# Patient Record
Sex: Male | Born: 1960 | Race: White | Hispanic: No | Smoking: Never smoker
Health system: Southern US, Community
[De-identification: ages and names within clinical notes are randomized; demographics above are authoritative.]

## PROBLEM LIST (undated history)

## (undated) DIAGNOSIS — U071 COVID-19: Secondary | ICD-10-CM

## (undated) DIAGNOSIS — R011 Cardiac murmur, unspecified: Secondary | ICD-10-CM

## (undated) DIAGNOSIS — I10 Essential (primary) hypertension: Secondary | ICD-10-CM

## (undated) DIAGNOSIS — I739 Peripheral vascular disease, unspecified: Secondary | ICD-10-CM

## (undated) DIAGNOSIS — J189 Pneumonia, unspecified organism: Secondary | ICD-10-CM

## (undated) DIAGNOSIS — E785 Hyperlipidemia, unspecified: Secondary | ICD-10-CM

## (undated) DIAGNOSIS — E119 Type 2 diabetes mellitus without complications: Secondary | ICD-10-CM

## (undated) HISTORY — DX: Type 2 diabetes mellitus without complications: E11.9

## (undated) HISTORY — PX: HAND SURGERY: SHX662

## (undated) HISTORY — DX: Essential (primary) hypertension: I10

## (undated) HISTORY — PX: TONSILLECTOMY AND ADENOIDECTOMY: SUR1326

## (undated) HISTORY — DX: Hyperlipidemia, unspecified: E78.5

---

## 2020-02-22 DIAGNOSIS — E11319 Type 2 diabetes mellitus with unspecified diabetic retinopathy without macular edema: Secondary | ICD-10-CM | POA: Insufficient documentation

## 2020-02-22 DIAGNOSIS — E785 Hyperlipidemia, unspecified: Secondary | ICD-10-CM | POA: Insufficient documentation

## 2020-02-22 DIAGNOSIS — I10 Essential (primary) hypertension: Secondary | ICD-10-CM | POA: Insufficient documentation

## 2020-02-22 DIAGNOSIS — E1169 Type 2 diabetes mellitus with other specified complication: Secondary | ICD-10-CM | POA: Insufficient documentation

## 2020-02-22 DIAGNOSIS — E1142 Type 2 diabetes mellitus with diabetic polyneuropathy: Secondary | ICD-10-CM | POA: Insufficient documentation

## 2020-03-12 LAB — EXTERNAL GENERIC LAB PROCEDURE: COLOGUARD: NEGATIVE

## 2020-05-11 DIAGNOSIS — G4700 Insomnia, unspecified: Secondary | ICD-10-CM | POA: Insufficient documentation

## 2020-05-26 ENCOUNTER — Encounter (INDEPENDENT_AMBULATORY_CARE_PROVIDER_SITE_OTHER): Payer: Self-pay | Admitting: Vascular Surgery

## 2020-05-26 ENCOUNTER — Other Ambulatory Visit (INDEPENDENT_AMBULATORY_CARE_PROVIDER_SITE_OTHER): Payer: Self-pay | Admitting: Vascular Surgery

## 2020-05-26 ENCOUNTER — Ambulatory Visit (INDEPENDENT_AMBULATORY_CARE_PROVIDER_SITE_OTHER): Payer: 59

## 2020-05-26 ENCOUNTER — Ambulatory Visit (INDEPENDENT_AMBULATORY_CARE_PROVIDER_SITE_OTHER): Payer: 59 | Admitting: Vascular Surgery

## 2020-05-26 ENCOUNTER — Other Ambulatory Visit: Payer: Self-pay

## 2020-05-26 ENCOUNTER — Other Ambulatory Visit (INDEPENDENT_AMBULATORY_CARE_PROVIDER_SITE_OTHER): Payer: Self-pay | Admitting: Podiatry

## 2020-05-26 ENCOUNTER — Telehealth (INDEPENDENT_AMBULATORY_CARE_PROVIDER_SITE_OTHER): Payer: Self-pay

## 2020-05-26 VITALS — BP 171/90 | HR 88 | Resp 16 | Ht 66.0 in | Wt 154.4 lb

## 2020-05-26 DIAGNOSIS — I10 Essential (primary) hypertension: Secondary | ICD-10-CM | POA: Diagnosis not present

## 2020-05-26 DIAGNOSIS — E782 Mixed hyperlipidemia: Secondary | ICD-10-CM | POA: Diagnosis not present

## 2020-05-26 DIAGNOSIS — E1142 Type 2 diabetes mellitus with diabetic polyneuropathy: Secondary | ICD-10-CM | POA: Diagnosis not present

## 2020-05-26 DIAGNOSIS — I739 Peripheral vascular disease, unspecified: Secondary | ICD-10-CM | POA: Diagnosis not present

## 2020-05-26 DIAGNOSIS — I7025 Atherosclerosis of native arteries of other extremities with ulceration: Secondary | ICD-10-CM | POA: Diagnosis not present

## 2020-05-26 NOTE — Telephone Encounter (Signed)
Spoke with the patient and he is scheduled with Dr. Gilda Crease for a left leg angio on 06/07/20 with a 1:15 pm arrival time to the [facility]. Covid testing on 06/03/20 between 8-2 pm at the [facility]. Pre-procedure instructions were discussed and will be mailed.

## 2020-05-26 NOTE — Progress Notes (Signed)
MRN : [id number]  Scott Howard is a 60 y.o. (04/26/1960) male who presents with chief complaint of  Chief Complaint  Patient presents with  . New Patient (Initial Visit)    Ref Engineer, production for PVD  .  History of Present Illness:   The patient is seen for evaluation of painful lower extremities and diminished pulses associated with ulceration of both feet but the left is more severely affected.  The patient notes the ulcer has been present for multiple weeks, occurring on March 30, 2020, and some seem not to be improving.  It is not painful.  There is a specific history of trauma with walking on a hot boardwalk in [place].  The patient denies fever or chills.  the patient does have diabetes which has been difficult to control.  Patient notes prior to the ulcer developing the patient denies claudication or rest pain.   The patient denies rest pain or dangling of an extremity off the side of the bed during the night for relief.  No prior interventions or surgeries.  No history of back problems or DJD of the lumbar sacral spine.   The patient denies amaurosis fugax or recent TIA symptoms. There are no recent neurological changes noted. The patient denies history of DVT, PE or superficial thrombophlebitis. The patient denies recent episodes of angina or shortness of breath.   ABI's Rt=1.23 with biphasic signals and Lt=[place] TBI=0.73 with biphasic signals  Current Meds  Medication Sig  . doxycycline (VIBRA-TABS) 100 MG tablet Take 100 mg by mouth 2 (two) times daily.  Marland Kitchen glipiZIDE (GLUCOTROL) 5 MG tablet Take 5 mg by mouth 2 (two) times daily.  Marland Kitchen lisinopril (ZESTRIL) 10 MG tablet Take 1 tablet by mouth daily.  . pravastatin (PRAVACHOL) 20 MG tablet Take 20 mg by mouth at bedtime.  Marland Kitchen SANTYL ointment Apply topically daily.    Past Medical History:  Diagnosis Date  . Diabetes mellitus without complication (HCC)   . Hyperlipidemia    borderline  . Hypertension       Social  History Social History   Tobacco Use  . Smoking status: Never Smoker  . Smokeless tobacco: Never Used  Vaping Use  . Vaping Use: Never used  Substance Use Topics  . Alcohol use: Yes    Comment: ocassionally  . Drug use: Never    Family History Family History  Problem Relation Age of Onset  . Cancer Mother   . Cancer Father   . Hypertension Brother   . Congestive Heart Failure Brother   No family history of bleeding/clotting disorders, porphyria or autoimmune disease   Allergies  Allergen Reactions  . Sulfa Antibiotics Rash     REVIEW OF SYSTEMS (Negative unless checked)  Constitutional: [] Weight loss  [] Fever  [] Chills Cardiac: [] Chest pain   [] Chest pressure   [] Palpitations   [] Shortness of breath when laying flat   [] Shortness of breath with exertion. Vascular:  [] Pain in legs with walking   [] Pain in legs at rest  [] History of DVT   [] Phlebitis   [] Swelling in legs   [] Varicose veins   [x] Non-healing ulcers Pulmonary:   [] Uses home oxygen   [] Productive cough   [] Hemoptysis   [] Wheeze  [] COPD   [] Asthma Neurologic:  [] Dizziness   [] Seizures   [] History of stroke   [] History of TIA  [] Aphasia   [] Vissual changes   [] Weakness or numbness in arm   [] Weakness or numbness in leg Musculoskeletal:   [] Joint swelling   []   Joint pain   [] Low back pain Hematologic:  [] Easy bruising  [] Easy bleeding   [] Hypercoagulable state   [] Anemic Gastrointestinal:  [] Diarrhea   [] Vomiting  [] Gastroesophageal reflux/heartburn   [] Difficulty swallowing. Genitourinary:  [] Chronic kidney disease   [] Difficult urination  [] Frequent urination   [] Blood in urine Skin:  [] Rashes   [] Ulcers  Psychological:  [] History of anxiety   []  History of major depression.  Physical Examination  Vitals:   05/26/20 1122  BP: (!) 171/90  Pulse: 88  Resp: 16  Weight: 154 lb 6.4 oz (70 kg)  Height: 5\' 6"  (1.676 m)   Body mass index is 24.92 kg/m. Gen: WD/WN, NAD Head: [place]/AT, No temporalis wasting.   Ear/Nose/Throat: Hearing grossly intact, nares w/o erythema or drainage, poor dentition Eyes: PER, EOMI, sclera nonicteric.  Neck: Supple, no masses.  No bruit or JVD.  Pulmonary:  Good air movement, clear to auscultation bilaterally, no use of accessory muscles.  Cardiac: RRR, normal S1, S2, no Murmurs. Vascular: both feet with multiple ulcers Vessel Right Left  Radial Palpable Palpable  PT Not Palpable Not Palpable  DP Not Palpable Not Palpable  Gastrointestinal: soft, non-distended. No guarding/no peritoneal signs.  Musculoskeletal: M/S 5/5 throughout.  No deformity or atrophy.  Neurologic: CN 2-12 intact. Pain and light touch intact in extremities.  Symmetrical.  Speech is fluent. Motor exam as listed above. Psychiatric: Judgment intact, Mood & affect appropriate for pt's clinical situation. Dermatologic: No rashes + ulcers noted.  No changes consistent with cellulitis.  CBC No results found for: WBC, HGB, HCT, MCV, PLT  BMET No results found for: NA, K, CL, CO2, GLUCOSE, BUN, CREATININE, CALCIUM, GFRNONAA, GFRAA CrCl cannot be calculated (No successful lab value found.).  COAG No results found for: INR, PROTIME  Radiology No results found.   Assessment/Plan 1. Atherosclerosis of native arteries of the extremities with ulceration (HCC)  Recommend:  The patient has evidence of severe atherosclerotic changes of both lower extremities associated with ulceration and tissue loss of the left > right foot.  This represents a limb threatening ischemia and places the patient at the risk for left limb loss.  ABI's Rt=1.23 with biphasic signals and Lt=[place] TBI=0.73 with biphasic signals  Patient should undergo angiography of the left lower extremity with the hope for intervention for limb salvage.  The risks and benefits as well as the alternative therapies was discussed in detail with the patient.  All questions were answered.  Patient agrees to proceed with left leg  angiography.  The patient will follow up with me in the office after the procedure.    2. Diabetic polyneuropathy associated with type 2 diabetes mellitus (HCC) Continue hypoglycemic medications as already ordered, these medications have been reviewed and there are no changes at this time.  Hgb A1C to be monitored as already arranged by primary service   3. HTN (hypertension), benign Continue antihypertensive medications as already ordered, these medications have been reviewed and there are no changes at this time.   4. Mixed hyperlipidemia Continue statin as ordered and reviewed, no changes at this time     , MD  05/26/2020 1:23 PM

## 2020-05-26 NOTE — H&P (View-Only) (Signed)
  MRN : [id number]  Scott Howard is a 60 y.o. (02/13/1960) male who presents with chief complaint of  Chief Complaint  Patient presents with  . New Patient (Initial Visit)    Ref Baker for PVD  .  History of Present Illness:   The patient is seen for evaluation of painful lower extremities and diminished pulses associated with ulceration of both feet but the left is more severely affected.  The patient notes the ulcer has been present for multiple weeks, occurring on March 30, 2020, and some seem not to be improving.  It is not painful.  There is a specific history of trauma with walking on a hot boardwalk in [place].  The patient denies fever or chills.  the patient does have diabetes which has been difficult to control.  Patient notes prior to the ulcer developing the patient denies claudication or rest pain.   The patient denies rest pain or dangling of an extremity off the side of the bed during the night for relief.  No prior interventions or surgeries.  No history of back problems or DJD of the lumbar sacral spine.   The patient denies amaurosis fugax or recent TIA symptoms. There are no recent neurological changes noted. The patient denies history of DVT, PE or superficial thrombophlebitis. The patient denies recent episodes of angina or shortness of breath.   ABI's Rt=1.23 with biphasic signals and Lt=[place] TBI=0.73 with biphasic signals  Current Meds  Medication Sig  . doxycycline (VIBRA-TABS) 100 MG tablet Take 100 mg by mouth 2 (two) times daily.  . glipiZIDE (GLUCOTROL) 5 MG tablet Take 5 mg by mouth 2 (two) times daily.  . lisinopril (ZESTRIL) 10 MG tablet Take 1 tablet by mouth daily.  . pravastatin (PRAVACHOL) 20 MG tablet Take 20 mg by mouth at bedtime.  . SANTYL ointment Apply topically daily.    Past Medical History:  Diagnosis Date  . Diabetes mellitus without complication (HCC)   . Hyperlipidemia    borderline  . Hypertension       Social  History Social History   Tobacco Use  . Smoking status: Never Smoker  . Smokeless tobacco: Never Used  Vaping Use  . Vaping Use: Never used  Substance Use Topics  . Alcohol use: Yes    Comment: ocassionally  . Drug use: Never    Family History Family History  Problem Relation Age of Onset  . Cancer Mother   . Cancer Father   . Hypertension Brother   . Congestive Heart Failure Brother   No family history of bleeding/clotting disorders, porphyria or autoimmune disease   Allergies  Allergen Reactions  . Sulfa Antibiotics Rash     REVIEW OF SYSTEMS (Negative unless checked)  Constitutional: []Weight loss  []Fever  []Chills Cardiac: []Chest pain   []Chest pressure   []Palpitations   []Shortness of breath when laying flat   []Shortness of breath with exertion. Vascular:  []Pain in legs with walking   []Pain in legs at rest  []History of DVT   []Phlebitis   []Swelling in legs   []Varicose veins   [x]Non-healing ulcers Pulmonary:   []Uses home oxygen   []Productive cough   []Hemoptysis   []Wheeze  []COPD   []Asthma Neurologic:  []Dizziness   []Seizures   []History of stroke   []History of TIA  []Aphasia   []Vissual changes   []Weakness or numbness in arm   []Weakness or numbness in leg Musculoskeletal:   []Joint swelling   []  Joint pain   [] Low back pain Hematologic:  [] Easy bruising  [] Easy bleeding   [] Hypercoagulable state   [] Anemic Gastrointestinal:  [] Diarrhea   [] Vomiting  [] Gastroesophageal reflux/heartburn   [] Difficulty swallowing. Genitourinary:  [] Chronic kidney disease   [] Difficult urination  [] Frequent urination   [] Blood in urine Skin:  [] Rashes   [] Ulcers  Psychological:  [] History of anxiety   []  History of major depression.  Physical Examination  Vitals:   05/26/20 1122  BP: (!) 171/90  Pulse: 88  Resp: 16  Weight: 154 lb 6.4 oz (70 kg)  Height: 5\' 6"  (1.676 m)   Body mass index is 24.92 kg/m. Gen: WD/WN, NAD Head: [place]/AT, No temporalis wasting.   Ear/Nose/Throat: Hearing grossly intact, nares w/o erythema or drainage, poor dentition Eyes: PER, EOMI, sclera nonicteric.  Neck: Supple, no masses.  No bruit or JVD.  Pulmonary:  Good air movement, clear to auscultation bilaterally, no use of accessory muscles.  Cardiac: RRR, normal S1, S2, no Murmurs. Vascular: both feet with multiple ulcers Vessel Right Left  Radial Palpable Palpable  PT Not Palpable Not Palpable  DP Not Palpable Not Palpable  Gastrointestinal: soft, non-distended. No guarding/no peritoneal signs.  Musculoskeletal: M/S 5/5 throughout.  No deformity or atrophy.  Neurologic: CN 2-12 intact. Pain and light touch intact in extremities.  Symmetrical.  Speech is fluent. Motor exam as listed above. Psychiatric: Judgment intact, Mood & affect appropriate for pt's clinical situation. Dermatologic: No rashes + ulcers noted.  No changes consistent with cellulitis.  CBC No results found for: WBC, HGB, HCT, MCV, PLT  BMET No results found for: NA, K, CL, CO2, GLUCOSE, BUN, CREATININE, CALCIUM, GFRNONAA, GFRAA CrCl cannot be calculated (No successful lab value found.).  COAG No results found for: INR, PROTIME  Radiology No results found.   Assessment/Plan 1. Atherosclerosis of native arteries of the extremities with ulceration (HCC)  Recommend:  The patient has evidence of severe atherosclerotic changes of both lower extremities associated with ulceration and tissue loss of the left > right foot.  This represents a limb threatening ischemia and places the patient at the risk for left limb loss.  ABI's Rt=1.23 with biphasic signals and Lt=[place] TBI=0.73 with biphasic signals  Patient should undergo angiography of the left lower extremity with the hope for intervention for limb salvage.  The risks and benefits as well as the alternative therapies was discussed in detail with the patient.  All questions were answered.  Patient agrees to proceed with left leg  angiography.  The patient will follow up with me in the office after the procedure.    2. Diabetic polyneuropathy associated with type 2 diabetes mellitus (HCC) Continue hypoglycemic medications as already ordered, these medications have been reviewed and there are no changes at this time.  Hgb A1C to be monitored as already arranged by primary service   3. HTN (hypertension), benign Continue antihypertensive medications as already ordered, these medications have been reviewed and there are no changes at this time.   4. Mixed hyperlipidemia Continue statin as ordered and reviewed, no changes at this time     , MD  05/26/2020 1:23 PM

## 2020-05-27 ENCOUNTER — Other Ambulatory Visit: Payer: Self-pay

## 2020-05-27 ENCOUNTER — Inpatient Hospital Stay
Admission: EM | Admit: 2020-05-27 | Discharge: 2020-06-01 | DRG: 617 | Disposition: A | Discharge status: Home / Self Care | Payer: 59 | Attending: Internal Medicine | Admitting: Internal Medicine

## 2020-05-27 ENCOUNTER — Other Ambulatory Visit (INDEPENDENT_AMBULATORY_CARE_PROVIDER_SITE_OTHER): Payer: Self-pay | Admitting: Vascular Surgery

## 2020-05-27 ENCOUNTER — Encounter
Admission: EM | Disposition: A | Discharge status: Home / Self Care | Payer: Self-pay | Source: Home / Self Care | Attending: Internal Medicine

## 2020-05-27 DIAGNOSIS — L97509 Non-pressure chronic ulcer of other part of unspecified foot with unspecified severity: Secondary | ICD-10-CM

## 2020-05-27 DIAGNOSIS — L97529 Non-pressure chronic ulcer of other part of left foot with unspecified severity: Secondary | ICD-10-CM | POA: Diagnosis present

## 2020-05-27 DIAGNOSIS — E1152 Type 2 diabetes mellitus with diabetic peripheral angiopathy with gangrene: Secondary | ICD-10-CM | POA: Diagnosis present

## 2020-05-27 DIAGNOSIS — L97519 Non-pressure chronic ulcer of other part of right foot with unspecified severity: Secondary | ICD-10-CM | POA: Diagnosis present

## 2020-05-27 DIAGNOSIS — I739 Peripheral vascular disease, unspecified: Secondary | ICD-10-CM

## 2020-05-27 DIAGNOSIS — E785 Hyperlipidemia, unspecified: Secondary | ICD-10-CM | POA: Diagnosis present

## 2020-05-27 DIAGNOSIS — I10 Essential (primary) hypertension: Secondary | ICD-10-CM | POA: Diagnosis present

## 2020-05-27 DIAGNOSIS — Z8249 Family history of ischemic heart disease and other diseases of the circulatory system: Secondary | ICD-10-CM

## 2020-05-27 DIAGNOSIS — Z79899 Other long term (current) drug therapy: Secondary | ICD-10-CM

## 2020-05-27 DIAGNOSIS — Z20822 Contact with and (suspected) exposure to covid-19: Secondary | ICD-10-CM | POA: Diagnosis present

## 2020-05-27 DIAGNOSIS — E860 Dehydration: Secondary | ICD-10-CM | POA: Diagnosis present

## 2020-05-27 DIAGNOSIS — I70249 Atherosclerosis of native arteries of left leg with ulceration of unspecified site: Secondary | ICD-10-CM | POA: Diagnosis not present

## 2020-05-27 DIAGNOSIS — Z809 Family history of malignant neoplasm, unspecified: Secondary | ICD-10-CM | POA: Diagnosis not present

## 2020-05-27 DIAGNOSIS — E1142 Type 2 diabetes mellitus with diabetic polyneuropathy: Secondary | ICD-10-CM | POA: Diagnosis present

## 2020-05-27 DIAGNOSIS — R7989 Other specified abnormal findings of blood chemistry: Secondary | ICD-10-CM | POA: Diagnosis present

## 2020-05-27 DIAGNOSIS — N19 Unspecified kidney failure: Secondary | ICD-10-CM | POA: Diagnosis present

## 2020-05-27 DIAGNOSIS — L97412 Non-pressure chronic ulcer of right heel and midfoot with fat layer exposed: Secondary | ICD-10-CM | POA: Diagnosis not present

## 2020-05-27 DIAGNOSIS — M869 Osteomyelitis, unspecified: Secondary | ICD-10-CM | POA: Diagnosis not present

## 2020-05-27 DIAGNOSIS — M86172 Other acute osteomyelitis, left ankle and foot: Secondary | ICD-10-CM | POA: Diagnosis present

## 2020-05-27 DIAGNOSIS — R809 Proteinuria, unspecified: Secondary | ICD-10-CM | POA: Diagnosis present

## 2020-05-27 DIAGNOSIS — E782 Mixed hyperlipidemia: Secondary | ICD-10-CM | POA: Diagnosis present

## 2020-05-27 DIAGNOSIS — E1169 Type 2 diabetes mellitus with other specified complication: Principal | ICD-10-CM | POA: Diagnosis present

## 2020-05-27 DIAGNOSIS — Y9301 Activity, walking, marching and hiking: Secondary | ICD-10-CM | POA: Diagnosis present

## 2020-05-27 DIAGNOSIS — E10621 Type 1 diabetes mellitus with foot ulcer: Secondary | ICD-10-CM

## 2020-05-27 DIAGNOSIS — Z7984 Long term (current) use of oral hypoglycemic drugs: Secondary | ICD-10-CM | POA: Diagnosis not present

## 2020-05-27 DIAGNOSIS — I70239 Atherosclerosis of native arteries of right leg with ulceration of unspecified site: Secondary | ICD-10-CM | POA: Diagnosis not present

## 2020-05-27 DIAGNOSIS — X58XXXA Exposure to other specified factors, initial encounter: Secondary | ICD-10-CM | POA: Diagnosis present

## 2020-05-27 DIAGNOSIS — Z882 Allergy status to sulfonamides status: Secondary | ICD-10-CM

## 2020-05-27 DIAGNOSIS — E11628 Type 2 diabetes mellitus with other skin complications: Secondary | ICD-10-CM | POA: Diagnosis present

## 2020-05-27 DIAGNOSIS — E11621 Type 2 diabetes mellitus with foot ulcer: Secondary | ICD-10-CM | POA: Diagnosis present

## 2020-05-27 DIAGNOSIS — L97511 Non-pressure chronic ulcer of other part of right foot limited to breakdown of skin: Secondary | ICD-10-CM | POA: Diagnosis not present

## 2020-05-27 DIAGNOSIS — I7025 Atherosclerosis of native arteries of other extremities with ulceration: Secondary | ICD-10-CM | POA: Diagnosis present

## 2020-05-27 DIAGNOSIS — I70235 Atherosclerosis of native arteries of right leg with ulceration of other part of foot: Secondary | ICD-10-CM | POA: Diagnosis not present

## 2020-05-27 DIAGNOSIS — L089 Local infection of the skin and subcutaneous tissue, unspecified: Secondary | ICD-10-CM | POA: Diagnosis present

## 2020-05-27 HISTORY — PX: LOWER EXTREMITY ANGIOGRAPHY: CATH118251

## 2020-05-27 LAB — COMPREHENSIVE METABOLIC PANEL
ALT: 10 U/L (ref 0–44)
AST: 10 U/L — ABNORMAL LOW (ref 15–41)
Albumin: 3.4 g/dL — ABNORMAL LOW (ref 3.5–5.0)
Alkaline Phosphatase: 72 U/L (ref 38–126)
Anion gap: 9 (ref 5–15)
BUN: 22 mg/dL — ABNORMAL HIGH (ref 6–20)
CO2: 24 mmol/L (ref 22–32)
Calcium: 9.2 mg/dL (ref 8.9–10.3)
Chloride: 100 mmol/L (ref 98–111)
Creatinine, Ser: 1.06 mg/dL (ref 0.61–1.24)
GFR, Estimated: >60 mL/min (ref >60)
Glucose, Bld: 139 mg/dL — ABNORMAL HIGH (ref 70–99)
Potassium: 4.5 mmol/L (ref 3.5–5.1)
Sodium: 133 mmol/L — ABNORMAL LOW (ref 135–145)
Total Bilirubin: 1.4 mg/dL — ABNORMAL HIGH (ref 0.3–1.2)
Total Protein: 8 g/dL (ref 6.5–8.1)

## 2020-05-27 LAB — CBC WITH DIFFERENTIAL/PLATELET
Abs Immature Granulocytes: 0.04 10*3/uL (ref 0.00–0.07)
Basophils Absolute: 0 10*3/uL (ref 0.0–0.1)
Basophils Relative: 1 %
Eosinophils Absolute: 0.1 10*3/uL (ref 0.0–0.5)
Eosinophils Relative: 2 %
HCT: 36.9 % — ABNORMAL LOW (ref 39.0–52.0)
Hemoglobin: 12.7 g/dL — ABNORMAL LOW (ref 13.0–17.0)
Immature Granulocytes: 1 %
Lymphocytes Relative: 22 %
Lymphs Abs: 1.6 10*3/uL (ref 0.7–4.0)
MCH: 29 pg (ref 26.0–34.0)
MCHC: 34.4 g/dL (ref 30.0–36.0)
MCV: 84.2 fL (ref 80.0–100.0)
Monocytes Absolute: 0.6 10*3/uL (ref 0.1–1.0)
Monocytes Relative: 8 %
Neutro Abs: 5.1 10*3/uL (ref 1.7–7.7)
Neutrophils Relative %: 66 %
Platelets: 268 10*3/uL (ref 150–400)
RBC: 4.38 MIL/uL (ref 4.22–5.81)
RDW: 11.6 % (ref 11.5–15.5)
WBC: 7.4 10*3/uL (ref 4.0–10.5)
nRBC: 0 % (ref 0.0–0.2)

## 2020-05-27 LAB — RESP PANEL BY RT-PCR (FLU A&B, COVID) ARPGX2
Influenza A by PCR: NEGATIVE
Influenza B by PCR: NEGATIVE
SARS Coronavirus 2 by RT PCR: NEGATIVE

## 2020-05-27 LAB — GLUCOSE, CAPILLARY: Glucose-Capillary: 99 mg/dL (ref 70–99)

## 2020-05-27 LAB — PROTIME-INR
INR: 1 (ref 0.8–1.2)
Prothrombin Time: 13.5 seconds (ref 11.4–15.2)

## 2020-05-27 LAB — C-REACTIVE PROTEIN: CRP: 3.3 mg/dL — ABNORMAL HIGH (ref <1.0)

## 2020-05-27 LAB — MAGNESIUM: Magnesium: 1.6 mg/dL — ABNORMAL LOW (ref 1.7–2.4)

## 2020-05-27 LAB — SEDIMENTATION RATE: Sed Rate: 83 mm/hr — ABNORMAL HIGH (ref 0–20)

## 2020-05-27 SURGERY — LOWER EXTREMITY ANGIOGRAPHY
Anesthesia: Moderate Sedation | Laterality: Left

## 2020-05-27 MED ORDER — SODIUM CHLORIDE 0.9 % IV SOLN: 250.0000 mL | INTRAVENOUS | Status: DC | PRN

## 2020-05-27 MED ORDER — MIDAZOLAM HCL 2 MG/ML PO SYRP: 8.0000 mg | ORAL_SOLUTION | Freq: Once | ORAL | Status: DC | PRN

## 2020-05-27 MED ORDER — VANCOMYCIN HCL 1750 MG/350ML IV SOLN
1750.0000 mg | Freq: Once | INTRAVENOUS | Status: AC
  Administered 2020-05-27: 1750 mg via INTRAVENOUS
  Filled 2020-05-27: qty 350

## 2020-05-27 MED ORDER — HEPARIN SODIUM (PORCINE) 1000 UNIT/ML IJ SOLN
INTRAMUSCULAR | Status: AC
  Filled 2020-05-27: qty 1

## 2020-05-27 MED ORDER — ONDANSETRON HCL 4 MG/2ML IJ SOLN: 4.0000 mg | Freq: Four times a day (QID) | INTRAMUSCULAR | Status: DC | PRN

## 2020-05-27 MED ORDER — HEPARIN (PORCINE) 25000 UT/250ML-% IV SOLN
1200.0000 [IU]/h | INTRAVENOUS | Status: DC
  Administered 2020-05-27: 950 [IU]/h via INTRAVENOUS
  Filled 2020-05-27: qty 250

## 2020-05-27 MED ORDER — DIPHENHYDRAMINE HCL 50 MG/ML IJ SOLN: 50.0000 mg | Freq: Once | INTRAMUSCULAR | Status: DC | PRN

## 2020-05-27 MED ORDER — SODIUM CHLORIDE 0.9 % IV BOLUS
500.0000 mL | Freq: Once | INTRAVENOUS | Status: AC
  Administered 2020-05-27: 500 mL via INTRAVENOUS

## 2020-05-27 MED ORDER — FENTANYL CITRATE (PF) 100 MCG/2ML IJ SOLN
INTRAMUSCULAR | Status: AC
  Filled 2020-05-27: qty 2

## 2020-05-27 MED ORDER — ACETAMINOPHEN 325 MG PO TABS
650.0000 mg | ORAL_TABLET | Freq: Four times a day (QID) | ORAL | Status: DC | PRN
  Administered 2020-05-27 – 2020-06-01 (×6): 650 mg via ORAL
  Filled 2020-05-27 (×5): qty 2

## 2020-05-27 MED ORDER — FENTANYL CITRATE (PF) 100 MCG/2ML IJ SOLN
INTRAMUSCULAR | Status: DC | PRN
  Administered 2020-05-27 (×2): 25 ug via INTRAVENOUS
  Administered 2020-05-27: 50 ug via INTRAVENOUS
  Administered 2020-05-27: 25 ug via INTRAVENOUS

## 2020-05-27 MED ORDER — MIDAZOLAM HCL 5 MG/5ML IJ SOLN
INTRAMUSCULAR | Status: AC
  Filled 2020-05-27: qty 5

## 2020-05-27 MED ORDER — SODIUM CHLORIDE 0.9 % IV SOLN: INTRAVENOUS | Status: DC

## 2020-05-27 MED ORDER — ACETAMINOPHEN 650 MG RE SUPP: 650.0000 mg | Freq: Four times a day (QID) | RECTAL | Status: DC | PRN

## 2020-05-27 MED ORDER — HYDRALAZINE HCL 20 MG/ML IJ SOLN
5.0000 mg | INTRAMUSCULAR | Status: DC | PRN
  Administered 2020-05-30: 5 mg via INTRAVENOUS
  Filled 2020-05-27: qty 1

## 2020-05-27 MED ORDER — SODIUM CHLORIDE 0.9 % IV SOLN: INTRAVENOUS | Status: AC

## 2020-05-27 MED ORDER — CEFAZOLIN SODIUM-DEXTROSE 2-4 GM/100ML-% IV SOLN
2.0000 g | Freq: Once | INTRAVENOUS | Status: AC
  Administered 2020-05-27: 2 g via INTRAVENOUS

## 2020-05-27 MED ORDER — VANCOMYCIN HCL 1750 MG/350ML IV SOLN
1750.0000 mg | Freq: Once | INTRAVENOUS | Status: DC
  Filled 2020-05-27: qty 350

## 2020-05-27 MED ORDER — HYDROMORPHONE HCL 1 MG/ML IJ SOLN: 1.0000 mg | Freq: Once | INTRAMUSCULAR | Status: DC | PRN

## 2020-05-27 MED ORDER — HEPARIN SODIUM (PORCINE) 1000 UNIT/ML IJ SOLN
INTRAMUSCULAR | Status: DC | PRN
  Administered 2020-05-27: 4000 [IU] via INTRAVENOUS
  Administered 2020-05-27: 1000 [IU] via INTRAVENOUS

## 2020-05-27 MED ORDER — OXYCODONE HCL 5 MG PO TABS: 5.0000 mg | ORAL_TABLET | ORAL | Status: DC | PRN

## 2020-05-27 MED ORDER — LABETALOL HCL 5 MG/ML IV SOLN: 10.0000 mg | INTRAVENOUS | Status: DC | PRN

## 2020-05-27 MED ORDER — IODIXANOL 320 MG/ML IV SOLN
INTRAVENOUS | Status: DC | PRN
  Administered 2020-05-27: 85 mL

## 2020-05-27 MED ORDER — MORPHINE SULFATE (PF) 2 MG/ML IV SOLN: 2.0000 mg | INTRAVENOUS | Status: DC | PRN

## 2020-05-27 MED ORDER — SODIUM CHLORIDE 0.9% FLUSH
3.0000 mL | Freq: Two times a day (BID) | INTRAVENOUS | Status: DC
  Administered 2020-05-28 – 2020-05-31 (×5): 3 mL via INTRAVENOUS

## 2020-05-27 MED ORDER — INSULIN ASPART 100 UNIT/ML IJ SOLN: 0.0000 [IU] | Freq: Four times a day (QID) | INTRAMUSCULAR | Status: DC

## 2020-05-27 MED ORDER — VANCOMYCIN HCL 1500 MG/300ML IV SOLN
1500.0000 mg | INTRAVENOUS | Status: DC
  Administered 2020-05-28 – 2020-05-29 (×2): 1500 mg via INTRAVENOUS
  Filled 2020-05-27 (×3): qty 300

## 2020-05-27 MED ORDER — METHYLPREDNISOLONE SODIUM SUCC 125 MG IJ SOLR: 125.0000 mg | Freq: Once | INTRAMUSCULAR | Status: DC | PRN

## 2020-05-27 MED ORDER — SODIUM CHLORIDE 0.9% FLUSH: 3.0000 mL | INTRAVENOUS | Status: DC | PRN

## 2020-05-27 MED ORDER — MIDAZOLAM HCL 2 MG/2ML IJ SOLN
INTRAMUSCULAR | Status: DC | PRN
  Administered 2020-05-27: 1 mg via INTRAVENOUS
  Administered 2020-05-27: 2 mg via INTRAVENOUS
  Administered 2020-05-27 (×2): 1 mg via INTRAVENOUS

## 2020-05-27 MED ORDER — FAMOTIDINE 20 MG PO TABS: 40.0000 mg | ORAL_TABLET | Freq: Once | ORAL | Status: DC | PRN

## 2020-05-27 MED ORDER — ACETAMINOPHEN 325 MG PO TABS
650.0000 mg | ORAL_TABLET | ORAL | Status: DC | PRN
  Filled 2020-05-27: qty 2

## 2020-05-27 MED ORDER — FENTANYL CITRATE (PF) 100 MCG/2ML IJ SOLN
25.0000 ug | INTRAMUSCULAR | Status: DC | PRN
  Administered 2020-05-29: 100 ug via INTRAVENOUS

## 2020-05-27 MED ORDER — SODIUM CHLORIDE 0.9 % IV SOLN
2.0000 g | INTRAVENOUS | Status: DC
  Administered 2020-05-28 – 2020-05-30 (×4): 2 g via INTRAVENOUS
  Filled 2020-05-27 (×2): qty 2
  Filled 2020-05-27: qty 20

## 2020-05-27 SURGICAL SUPPLY — 27 items
BALLN ULTRASCORE 014 3X100X150 (BALLOONS) ×2
BALLN ULTRASCORE 014 3X40X150 (BALLOONS) ×2
BALLN ULTRVRSE 2.5X40X150 (BALLOONS) ×2
BALLN ULTRVRSE 2X40X150 (BALLOONS) ×2
BALLOON ULTRSCRE 014 3X100X150 (BALLOONS) ×1 IMPLANT
BALLOON ULTRSCRE 014 3X40X150 (BALLOONS) ×1 IMPLANT
BALLOON ULTRVRSE 2.5X40X150 (BALLOONS) ×1 IMPLANT
BALLOON ULTRVRSE 2X40X150 (BALLOONS) ×1 IMPLANT
CANISTER PENUMBRA ENGINE (MISCELLANEOUS) ×2 IMPLANT
CANNULA 5F STIFF (CANNULA) ×2 IMPLANT
CATH ANGIO 5F PIGTAIL 65CM (CATHETERS) ×2 IMPLANT
CATH INDIGO CAT RX KIT (CATHETERS) ×2 IMPLANT
CATH INDIGO CAT3 KIT (CATHETERS) ×2 IMPLANT
CATH VERT 5FR 125CM (CATHETERS) ×2 IMPLANT
COVER PROBE U/S 5X48 (MISCELLANEOUS) ×2 IMPLANT
DEVICE STARCLOSE SE CLOSURE (Vascular Products) ×2 IMPLANT
DEVICE TORQUE (MISCELLANEOUS) ×2 IMPLANT
GLIDEWIRE ADV .014X300CM (WIRE) ×2 IMPLANT
GLIDEWIRE ADV .035X260CM (WIRE) ×2 IMPLANT
KIT ENCORE 26 ADVANTAGE (KITS) ×2 IMPLANT
PACK ANGIOGRAPHY (CUSTOM PROCEDURE TRAY) ×2 IMPLANT
SHEATH BRITE TIP 5FRX11 (SHEATH) ×2 IMPLANT
SHEATH RAABE 6FRX70 (SHEATH) ×2 IMPLANT
SYR MEDRAD MARK 7 150ML (SYRINGE) ×2 IMPLANT
TUBING CONTRAST HIGH PRESS 48 (TUBING) ×4 IMPLANT
WIRE GUIDERIGHT .035X150 (WIRE) ×2 IMPLANT
WIRE RUNTHROUGH .014X300CM (WIRE) ×2 IMPLANT

## 2020-05-27 NOTE — ED Provider Notes (Signed)
[facility] Emergency Department Provider Note  ____________________________________________   Event Date/Time   First MD Initiated Contact with Patient 05/27/20 1302     (approximate)  I have reviewed the triage vital signs and the nursing notes.   HISTORY  Chief Complaint Wound Infection    HPI Scott Howard is a 60 y.o. male with diabetes, hypertension, hyperlipidemia who comes in with osteomyelitis of his left pinky toe.  Patient reports having osteomyelitis diagnosed on x-ray by podiatry.  He is currently on doxycycline started on Wednesday.  Patient was sent in for vascular to do angiography and consideration of amputation of the toe.  Patient does report that the wound has been constant, few days, nothing makes better, nothing makes it worse.  Denies any coldness of his foot or fevers or any other concerns       Past Medical History:  Diagnosis Date  . Diabetes mellitus without complication (HCC)   . Hyperlipidemia    borderline  . Hypertension     Patient Active Problem List   Diagnosis Date Noted  . Atherosclerosis of native arteries of the extremities with ulceration (HCC) 05/26/2020  . Insomnia 05/11/2020  . Diabetic polyneuropathy associated with type 2 diabetes mellitus (HCC) 02/22/2020  . HTN (hypertension), benign 02/22/2020  . Hyperlipidemia 02/22/2020  . Type 2 diabetes mellitus with retinopathy, with long-term current use of insulin (HCC) 02/22/2020    Past Surgical History:  Procedure Laterality Date  . HAND SURGERY Left   . TONSILLECTOMY AND ADENOIDECTOMY      Prior to Admission medications   Medication Sig Start Date End Date Taking? Authorizing Provider  doxycycline (VIBRA-TABS) 100 MG tablet Take 100 mg by mouth 2 (two) times daily. 05/25/20   [provider]  glipiZIDE (GLUCOTROL) 5 MG tablet Take 5 mg by mouth 2 (two) times daily. 02/23/20   [provider]  lisinopril (ZESTRIL) 10 MG tablet Take  1 tablet by mouth daily. 03/23/20   [provider]  pravastatin (PRAVACHOL) 20 MG tablet Take 20 mg by mouth at bedtime. 03/23/20   [provider]  SANTYL ointment Apply topically daily. 05/25/20   [provider]    Allergies Sulfa antibiotics  Family History  Problem Relation Age of Onset  . Cancer Mother   . Cancer Father   . Hypertension Brother   . Congestive Heart Failure Brother     Social History Social History   Tobacco Use  . Smoking status: Never Smoker  . Smokeless tobacco: Never Used  Vaping Use  . Vaping Use: Never used  Substance Use Topics  . Alcohol use: Yes    Comment: ocassionally  . Drug use: Never      Review of Systems Constitutional: No fever/chills Eyes: No visual changes. ENT: No sore throat. Cardiovascular: Denies chest pain. Respiratory: Denies shortness of breath. Gastrointestinal: No abdominal pain.  No nausea, no vomiting.  No diarrhea.  No constipation. Genitourinary: Negative for dysuria. Musculoskeletal: Negative for back pain.  Toe infection Skin: Negative for rash. Neurological: Negative for headaches, focal weakness or numbness. All other ROS negative ____________________________________________   PHYSICAL EXAM:  VITAL SIGNS: ED Triage Vitals [05/27/20 1154]  Enc Vitals Group     BP 131/84     Pulse Rate 91     Resp 16     Temp 98.5 F (36.9 C)     Temp Source Oral     SpO2 97 %     Weight 154 lb  5.2 oz (70 kg)     Height 5\' 6"  (1.676 m)     Head Circumference      Peak Flow      Pain Score 0     Pain Loc      Pain Edu?      Excl. in GC?     Constitutional: Alert and oriented. Well appearing and in no acute distress. Eyes: Conjunctivae are normal. EOMI. Head: Atraumatic. Nose: No congestion/rhinnorhea. Mouth/Throat: Mucous membranes are moist.   Neck: No stridor. Trachea Midline. FROM Cardiovascular: Normal rate, regular rhythm. Grossly normal heart sounds.  Good peripheral  circulation. Respiratory: Normal respiratory effort.  No retractions. Lungs CTAB. Gastrointestinal: Soft and nontender. No distention. No abdominal bruits.  Musculoskeletal: Foot appears warm and well-perfused but does have a necrotic pinky toe. Neurologic:  Normal speech and language. No gross focal neurologic deficits are appreciated.  Skin:  Skin is warm, dry and intact. No rash noted. Psychiatric: Mood and affect are normal. Speech and behavior are normal. GU: Deferred   ____________________________________________   LABS (all labs ordered are listed, but only abnormal results are displayed)  Labs Reviewed  COMPREHENSIVE METABOLIC PANEL - Abnormal; Notable for the following components:      Result Value   Sodium 133 (*)    Glucose, Bld 139 (*)    BUN 22 (*)    Albumin 3.4 (*)    AST 10 (*)    Total Bilirubin 1.4 (*)    All other components within normal limits  CBC WITH DIFFERENTIAL/PLATELET - Abnormal; Notable for the following components:   Hemoglobin 12.7 (*)    HCT 36.9 (*)    All other components within normal limits  URINALYSIS, COMPLETE (UACMP) WITH MICROSCOPIC   ____________________________________________     RADIOLOGY , personally viewed and evaluated these images (plain radiographs) as part of my medical decision making, as well as reviewing the written report by the radiologist.  ED MD interpretation: Pending MRI  Official radiology report(s): No results found.  ____________________________________________   PROCEDURES  Procedure(s) performed (including Critical Care):  Procedures   ____________________________________________   INITIAL IMPRESSION / ASSESSMENT AND PLAN / ED COURSE  Scott Howard was evaluated in Emergency Department on 05/27/2020 for the symptoms described in the history of present illness. He was evaluated in the context of the global COVID-19 pandemic, which necessitated consideration that the patient might be  at risk for infection with the SARS-CoV-2 virus that causes COVID-19. Institutional protocols and algorithms that pertain to the evaluation of patients at risk for COVID-19 are in a state of rapid change based on information released by regulatory bodies including the CDC and federal and state organizations. These policies and algorithms were followed during the patient's care in the ED.     Patient is a 60 year old who comes in with osteomyelitis.  Patient is not septic appearing.  Low suspicion for bacteremia.  Currently patient is foot feels warm and I have low suspicion for acute ischemia although clearly he is got some ischemic component given the necrotic toe.  Labs were ordered to evaluate for white count, electrolyte abnormalities, AKI.  I discussed with podiatry who recommended MRI.  At this time we will keep patient on the doxycycline given he just started that.  And discussed with Dr. [id number] from vascular who states that they will try to do angiography today.  Discussed the hospital team and they will admit patient      ____________________________________________  FINAL CLINICAL IMPRESSION(S) / ED DIAGNOSES   Final diagnoses:  Other acute osteomyelitis of left foot (HCC)      MEDICATIONS GIVEN DURING THIS VISIT:  Medications  acetaminophen (TYLENOL) tablet 650 mg ( Oral MAR Hold 05/27/20 1525)    Or  acetaminophen (TYLENOL) suppository 650 mg ( Rectal MAR Hold 05/27/20 1525)  0.9 %  sodium chloride infusion (has no administration in time range)  cefTRIAXone (ROCEPHIN) 2 g in sodium chloride 0.9 % 100 mL IVPB ( Intravenous Automatically Held 06/04/20 1500)  fentaNYL (SUBLIMAZE) injection 25 mcg ( Intravenous MAR Hold 05/27/20 1525)  ondansetron (ZOFRAN) injection 4 mg ( Intravenous MAR Hold 05/27/20 1525)  insulin aspart (novoLOG) injection 0-6 Units ( Subcutaneous Automatically Held 06/04/20 1800)  vancomycin (VANCOREADY) IVPB 1500 mg/300 mL ( Intravenous Automatically Held  06/05/20 1800)  vancomycin (VANCOREADY) IVPB 1750 mg/350 mL (has no administration in time range)  heparin sodium (porcine) 1000 UNIT/ML injection (has no administration in time range)  fentaNYL (SUBLIMAZE) 100 MCG/2ML injection (has no administration in time range)  midazolam (VERSED) 5 MG/5ML injection (has no administration in time range)     ED Discharge Orders    None       Note:  This document was prepared using Dragon voice recognition software and may include unintentional dictation errors.   Concha Se, MD 05/27/20 [id number]

## 2020-05-27 NOTE — Interval H&P Note (Signed)
History and Physical Interval Note:  05/27/2020 3:48 PM  Scott Howard  has presented today for surgery, with the diagnosis of Atherosclerotic disease with claudication.  The various methods of treatment have been discussed with the patient and family. After consideration of risks, benefits and other options for treatment, the patient has consented to  Procedure(s): Lower Extremity Angiography (Left) as a surgical intervention.  The patient's history has been reviewed, patient examined, no change in status, stable for surgery.  I have reviewed the patient's chart and labs.  Questions were answered to the patient's satisfaction.     [name]

## 2020-05-27 NOTE — H&P (Signed)
History and Physical    PLEASE NOTE THAT DRAGON DICTATION SOFTWARE WAS USED IN THE CONSTRUCTION OF THIS NOTE.   Scott Howard RKY:[id number] DOB: [birth year] DOA: 05/27/2020  PCP: [name], MD Patient coming from: home   I have personally briefly reviewed patient's old medical records in [place]  Chief Complaint: Osteomyelitis of left fifth toe  HPI: Scott Howard is a 60 y.o. male with medical history significant for type 2 diabetes mellitus complicated by peripheral polyneuropathy and foot ulcers, hypertension, hyperlipidemia, who is admitted to [facility] on 05/27/2020 with osteomyelitis of the left fifth toe after presenting from home to [facility] ED for further evaluation and management of such at the direction of his outpatient podiatrist.   The patient reports that he developed bilateral foot ulcer approximately 7 weeks ago after walking on a hot boardwalk in [place] in his bare feet.  Subsequently, he has been following with outpatient podiatry and wound care, and conveys outstanding compliance with the wound care instructions that he has been provided.  However, in spite of this, he reports progression of ulceration on the left foot, for which she followed up with Dr. Luana Shu of podiatry on Wednesday, 05/25/2020.  At the time of that appointment, the patient underwent plain films of the left foot which were suggestive of osteomyelitis of the left fifth toe prompting initiation of oral doxycycline.  Patient conveys good interval compliance with his antibiotic, with first dose occurring on 05/25/2020, but notes no consistent improvement in the ulceration associated with his left foot over that time.  Dr. Luana Shu referred the patient to vascular surgery for further evaluation, including angiography of the left lower extremity.  Presently, the patient was seen by Dr. Delana Meyer In vascular surgery clinic on 05/26/2020.  At that time, ABI of the right lower extremity was  found to be 1.23, while TBI of the left was noted to be 0.73.  Per review of consult note from that appointment, vascular surgery recommended that the patient undergo left lower extremity angiography.  However, the patient reports that the angiography could not be scheduled until May 10, prompting Dr. Luana Shu to advise the patient to present to the emergency department for further evaluation of his recently diagnosed osteomyelitis of the left fifth toe.   Today, the patient denies any recent discharge from his foot ulcer, including purulence.  Denies any acute cold sensation associated with the left lower extremity or left foot.  Denies any subjective fever, chills, rigors, or generalized myalgias.  Denies any known history of underlying coronary artery disease, and denies any recent chest pain, diaphoresis, palpitations, dizziness, presyncope, or syncope.  He denies any known history of underlying heart failure, denies any recent shortness of breath, orthopnea, or new onset peripheral edema.  No recent cough, wheezing, nausea, vomiting, abdominal pain, diarrhea.  No recent known COVID-19 exposures.  He also denies any recent dysuria, gross hematuria, or change in urinary urgency/frequency.  Per patient, he is not on any blood thinning agents as an outpatient, including no aspirin.  He reports that he has been n.p.o. since yesterday in anticipation of angiography.  Medical history notable for type 2 diabetes mellitus with associated peripheral polyneuropathy.  Most recent hemoglobin A1c was noted to be 12.3% on 02/22/2020.  Patient reports that he is not on any insulin as an outpatient, and takes glipizide as a sole oral hypoglycemic agent.     ED Course:  Vital signs in the ED were notable for  the following: Tetramex 98.5, heart rate 80-91; blood pressure 131/84 -146/77; respiratory rate 16, oxygen saturation 97 to 99% on room air.  Labs were notable for the following: CMP was notable for the following:  Sodium 133, potassium 4.5, BUN 22, creatinine 1.06, glucose 139.  CBC was notable for white blood cell count 7400, hemoglobin 12.7, platelets 268.  Screening nasopharyngeal COVID-19 PCR was performed in the ED today, and found to be negative.  Urinalysis was ordered in the ED, with result currently pending.  The patient's case was discussed with the on-call vascular surgeon, Dr. Delana Meyer, who will formally consult. Dr. Delana Meyer recommends angiography of the left lower extremity, which will try to be completed today.  Additionally, case was discussed with Dr. Cleda Mccreedy of podiatry, who will also formally consult.  Dr. Cleda Mccreedy recommended MRI of the left foot, and plans to evaluate the patient later today, for determination of timing of surgery involving left fifth toe, which may happen tomorrow.  The patient is to be kept n.p.o. for now for angiography of the left lower extremity, with subsequent n.p.o. after midnight for potential amputation of the left fifth toe tomorrow.  While in the ED, the following were administered:  Continuous normal saline x75 cc/h.     Review of Systems: As per HPI otherwise 10 point review of systems negative.   Past Medical History:  Diagnosis Date  . Diabetes mellitus without complication ([place])   . Hyperlipidemia    borderline  . Hypertension     Past Surgical History:  Procedure Laterality Date  . HAND SURGERY Left   . TONSILLECTOMY AND ADENOIDECTOMY      Social History:  reports that he has never smoked. He has never used smokeless tobacco. He reports current alcohol use. He reports that he does not use drugs.   Allergies  Allergen Reactions  . Sulfa Antibiotics Rash    Family History  Problem Relation Age of Onset  . Cancer Mother   . Cancer Father   . Hypertension Brother   . Congestive Heart Failure Brother       Prior to Admission medications   Medication Sig Start Date End Date Taking? Authorizing Provider  doxycycline (VIBRA-TABS) 100 MG  tablet Take 100 mg by mouth 2 (two) times daily. 05/25/20  Yes [provider]  glipiZIDE (GLUCOTROL) 5 MG tablet Take 5 mg by mouth 2 (two) times daily. 02/23/20  Yes [provider]  ibuprofen (ADVIL) 800 MG tablet Take 800 mg by mouth every 8 (eight) hours as needed.   Yes [provider]  lisinopril (ZESTRIL) 10 MG tablet Take 1 tablet by mouth daily. 03/23/20  Yes [provider]  pravastatin (PRAVACHOL) 20 MG tablet Take 20 mg by mouth at bedtime. 03/23/20  Yes [provider]  SANTYL ointment Apply topically daily. 05/25/20  Yes [provider]     Objective    Physical Exam: Vitals:   05/27/20 1154 05/27/20 1315  BP: 131/84 (!) 146/77  Pulse: 91 80  Resp: 16 16  Temp: 98.5 F (36.9 C)   TempSrc: Oral   SpO2: 97% 99%  Weight: 70 kg   Height: '5\' 6"'  (1.676 m)     General: appears to be stated age; alert, oriented Skin: warm, dry; left lateral foot dressing c/d/i Head:  AT/[place] Mouth:  Oral mucosa membranes appear dry, normal dentition Neck: supple; trachea midline Heart:  RRR; did not appreciate any M/R/G Lungs: CTAB, did not appreciate any wheezes, rales, or rhonchi Abdomen: +  BS; soft, ND, NT Extremities: no peripheral edema, no muscle wasting, eft lateral foot dressing c/d/i, as above.  Neuro: strength intact in upper and lower extremities b/l; sensation intact in bilateral upper extremities.    Labs on Admission: I have personally reviewed following labs and imaging studies  CBC: Recent Labs  Lab 05/27/20 1157  WBC 7.4  NEUTROABS 5.1  HGB 12.7*  HCT 36.9*  MCV 84.2  PLT 132   Basic Metabolic Panel: Recent Labs  Lab 05/27/20 1157  NA 133*  K 4.5  CL 100  CO2 24  GLUCOSE 139*  BUN 22*  CREATININE 1.06  CALCIUM 9.2   GFR: Estimated Creatinine Clearance: 66.9 mL/min (by C-G formula based on SCr of 1.06 mg/dL). Liver Function Tests: Recent Labs  Lab 05/27/20 1157  AST 10*  ALT 10  ALKPHOS 72   BILITOT 1.4*  PROT 8.0  ALBUMIN 3.4*   No results for input(s): LIPASE, AMYLASE in the last 168 hours. No results for input(s): AMMONIA in the last 168 hours. Coagulation Profile: No results for input(s): INR, PROTIME in the last 168 hours. Cardiac Enzymes: No results for input(s): CKTOTAL, CKMB, CKMBINDEX, TROPONINI in the last 168 hours. BNP (last 3 results) No results for input(s): PROBNP in the last 8760 hours. HbA1C: No results for input(s): HGBA1C in the last 72 hours. CBG: No results for input(s): GLUCAP in the last 168 hours. Lipid Profile: No results for input(s): CHOL, HDL, LDLCALC, TRIG, CHOLHDL, LDLDIRECT in the last 72 hours. Thyroid Function Tests: No results for input(s): TSH, T4TOTAL, FREET4, T3FREE, THYROIDAB in the last 72 hours. Anemia Panel: No results for input(s): VITAMINB12, FOLATE, FERRITIN, TIBC, IRON, RETICCTPCT in the last 72 hours. Urine analysis: No results found for: COLORURINE, APPEARANCEUR, LABSPEC, PHURINE, GLUCOSEU, HGBUR, BILIRUBINUR, KETONESUR, PROTEINUR, UROBILINOGEN, NITRITE, LEUKOCYTESUR  Radiological Exams on Admission: VAS [place] ABI WITH/WO TBI  Result Date: 05/26/2020  LOWER EXTREMITY DOPPLER STUDY Patient Name:  Scott Howard  Date of Exam:   05/26/2020 Medical Rec #: [id number]       Accession #:    [id number] Date of Birth: August 21, 1960       Patient Gender: M Patient Age:   60Y Exam Location:  [facility] Procedure:      VAS [place] ABI WITH/WO TBI Referring Phys: [id number] [place] --------------------------------------------------------------------------------  Indications: Peripheral artery disease.  Performing Technologist: Almira Coaster RVS  Examination Guidelines: A complete evaluation includes at minimum, Doppler waveform signals and systolic blood pressure reading at the level of bilateral brachial, anterior tibial, and posterior tibial arteries, when vessel segments are accessible. Bilateral testing is considered an  integral part of a complete examination. Photoelectric Plethysmograph (PPG) waveforms and toe systolic pressure readings are included as required and additional duplex testing as needed. Limited examinations for reoccurring indications may be performed as noted.  ABI Findings: +---------+------------------+-----+--------+--------+ Right    Rt Pressure (mmHg)IndexWaveformComment  +---------+------------------+-----+--------+--------+ Brachial 149                                     +---------+------------------+-----+--------+--------+ ATA      139               0.89 biphasic         +---------+------------------+-----+--------+--------+ PTA      193               1.23 biphasic         +---------+------------------+-----+--------+--------+  Great Toe250               1.59 Normal  [place]       +---------+------------------+-----+--------+--------+ +---------+------------------+-----+--------+-------+ Left     Lt Pressure (mmHg)IndexWaveformComment +---------+------------------+-----+--------+-------+ Brachial 157                                    +---------+------------------+-----+--------+-------+ ATA      250               1.59 biphasic        +---------+------------------+-----+--------+-------+ PTA      250               1.59 biphasic        +---------+------------------+-----+--------+-------+ Great Toe115               0.73 Normal          +---------+------------------+-----+--------+-------+ +-------+-----------+-----------+------------+------------+ ABI/TBIToday's ABIToday's TBIPrevious ABIPrevious TBI +-------+-----------+-----------+------------+------------+ Right  1.23       >1.0 [place]                             +-------+-----------+-----------+------------+------------+ Left   >1.0 [place]    .73                                 +-------+-----------+-----------+------------+------------+  Summary: Right: Resting right ankle-brachial index  is within normal range. No evidence of significant right lower extremity arterial disease. The right toe-brachial index is normal. Left: Resting left ankle-brachial index indicates noncompressible left lower extremity arteries. The left toe-brachial index is normal.  *See table(s) above for measurements and observations.  Electronically signed by Hortencia Pilar MD on 05/26/2020 at 5:50:25 PM.    Final      Assessment/Plan   MEHDI GIRONDA is a 60 y.o. male with medical history significant for type 2 diabetes mellitus complicated by peripheral polyneuropathy and foot ulcers, hypertension, hyperlipidemia, who is admitted to [facility] on 05/27/2020 with osteomyelitis of the left fifth toe after presenting from home to [facility] ED for further evaluation and management of such at the direction of his outpatient podiatrist.    Principal Problem:   Osteomyelitis of fifth toe of left foot ([place]) Active Problems:   Diabetic polyneuropathy associated with type 2 diabetes mellitus ([place])   HTN (hypertension), benign   Hyperlipidemia   Dehydration   Acute prerenal azotemia     #) Osteomyelitis of the left fifth toe: Diagnosis of such established on 05/25/2020 with plain films of the left foot performed as an outpatient the setting of poorly healing diabetic foot ulcer of the left foot. Dr. Delana Meyer has been consulted, with plan for angiography of the left lower extremity today.  He requests the patient be kept n.p.o. leading up to this.  Additionally, Dr. Cleda Mccreedy of podiatry has been consulted, and request MRI of the left foot, with plan to evaluate the patient in person today along with tentative plan for amputation of the fifth left toe possibly tomorrow morning (05/28/20).  Complicating factors leading to development of the patient's diabetic foot ulcer as well as delayed healing of such and ensuing development of osteomyelitis include underlying diabetes associated with peripheral  polyneuropathy as well as peripheral artery disease.  Of note, SIRS criteria are not met at this time, and therefore the patient is not  septic at present.  Will initiate empiric IV antibiotics for coverage of osteomyelitis, as further described below, after collection of blood cultures x2.   No evidence of acute MI or acutely decompensated heart failure at this time.  Consequently, no overt contraindication to proceeding with proposed surgery at this time.  The patient is not on any blood thinning agents at home, including no aspirin.    Plan: Vascular surgery consultation, with plan for angiography of the left lower extremity later today, as above.  Maintain n.p.o. status leading up to angiography.  Additionally, podiatry consulted, as above, with plan to evaluate the patient later today.  Tentatively, we will keep patient n.p.o. after midnight for potential surgical intervention tomorrow (05/28/20).  Preop EKG.  Add on INR.  Blood cultures x2 prior to initiation of IV vancomycin and Rocephin.  Repeat CBC in the morning.  Check ESR and CRP.     #) Dehydration: Clinical appearance of dehydration in the setting of dry oral mucous membranes, with supportive laboratory findings that include prerenal azotemia.  Suspect contribution from the patient remaining n.p.o. throughout today in anticipation of angiography of the left lower extremity, as further described above.  Plan: Continuous IV fluids with normal saline at 75 cc/h.  Monitor strict I's and O's and daily weights.  Repeat BMP in the morning.       #) Essential hypertension: Outpatient hypertensive regimen is limited to lisinopril.  Presenting systolic blood pressures noted to be in the 130s to 140s, without any evidence of associated hypotension.  Plan: In the setting of current n.p.o. status, will hold home lisinopril for now.  Close monitoring of ensuing blood pressure via routine vital signs.       #) Hyperlipidemia: On pravastatin  as an outpatient.  Plan: Currently holding home statin in the setting of n.p.o. status, with plan to resume postoperatively.       #) Type 2 diabetes mellitus: Complicated by peripheral polyneuropathy.  Appears suboptimally controlled as an outpatient, with most recent hemoglobin A1c noted to be 12.3% on 02/22/2020.  Not on any insulin as an outpatient, rather on glipizide as a sole outpatient oral hypoglycemic agent.  Presenting blood sugar per presenting BMP noted to be 139.  Suboptimal glycemic control appears to be a complicating factor in the rate of healing of the patient's diabetic foot ulcers, and ultimate development of apparent osteomyelitis of the left fifth toe.  Plan: Hold home glipizide during this hospitalization.  Check hemoglobin A1c.  In the context of current n.p.o. status, have ordered Accu-Cheks every 6 hours with low-dose sliding scale insulin.     DVT prophylaxis: scd's  Code Status: Full code Family Communication: none Disposition Plan: Per Rounding Team Consults called: Dr. Cleda Mccreedy of podiatry, and Dr. Delana Meyer of vascular surgery have both been consulted, as further detailed above. Admission status: Inpatient; MedSurg.     Of note, this patient was added by me to the following Admit List/Treatment Team: armcadmits.      PLEASE NOTE THAT DRAGON DICTATION SOFTWARE WAS USED IN THE CONSTRUCTION OF THIS NOTE.   [facility] Pager [phone] From 12PM - 12AM  Otherwise, please contact night-coverage  [email or web address] Password [facility]   05/27/2020, 2:39 PM

## 2020-05-27 NOTE — Consult Note (Addendum)
ANTICOAGULATION CONSULT NOTE   Pharmacy Consult for Heparin Indication: Multiple tibial angioplasties left lower extremity  Allergies  Allergen Reactions  . Sulfa Antibiotics Rash    Patient Measurements: Height: 5\' 6"  (167.6 cm) Weight: 69.9 kg (154 lb) IBW/kg (Calculated) : 63.8 Heparin Dosing Weight: 69.9 kg  Vital Signs: Temp: 98.4 F (36.9 C) (04/29 1506) Temp Source: Oral (04/29 1506) BP: 153/86 (04/29 1745) Pulse Rate: 71 (04/29 1745)  Labs: Recent Labs    05/27/20 1157  HGB 12.7*  HCT 36.9*  PLT 268  CREATININE 1.06    Estimated Creatinine Clearance: 66.9 mL/min (by C-G formula based on SCr of 1.06 mg/dL).   Medical History: Past Medical History:  Diagnosis Date  . Diabetes mellitus without complication (HCC)   . Hyperlipidemia    borderline  . Hypertension     Medications:  Medications Prior to Admission  Medication Sig Dispense Refill Last Dose  . doxycycline (VIBRA-TABS) 100 MG tablet Take 100 mg by mouth 2 (two) times daily.   05/26/2020 at 2000  . glipiZIDE (GLUCOTROL) 5 MG tablet Take 5 mg by mouth 2 (two) times daily.   05/26/2020 at 2000  . ibuprofen (ADVIL) 800 MG tablet Take 800 mg by mouth every 8 (eight) hours as needed.   Past Week at Unknown time  . lisinopril (ZESTRIL) 10 MG tablet Take 1 tablet by mouth daily.   05/26/2020 at 1200  . pravastatin (PRAVACHOL) 20 MG tablet Take 20 mg by mouth at bedtime.   05/26/2020 at 2000  . SANTYL ointment Apply topically daily.   Past Week at Unknown time   Scheduled:  . fentaNYL      . fentaNYL      . heparin sodium (porcine)      . [MAR Hold] insulin aspart  0-6 Units Subcutaneous Q6H  . midazolam       Infusions:  . sodium chloride    . sodium chloride    . [MAR Hold] cefTRIAXone (ROCEPHIN)  IV    . [MAR Hold] vancomycin    . vancomycin     PRN: [MAR Hold] acetaminophen **OR** [MAR Hold] acetaminophen, diphenhydrAMINE, famotidine, [MAR Hold] fentaNYL (SUBLIMAZE) injection, HYDROmorphone  (DILAUDID) injection, methylPREDNISolone (SOLU-MEDROL) injection, midazolam, [MAR Hold] ondansetron (ZOFRAN) IV, ondansetron (ZOFRAN) IV Anti-infectives (From admission, onward)   Start     Dose/Rate Route Frequency Ordered Stop   05/28/20 1800  [MAR Hold]  vancomycin (VANCOREADY) IVPB 1500 mg/300 mL        (MAR Hold since Fri 05/27/2020 at 1525.Hold Reason: Transfer to a Procedural area.)   1,500 mg 150 mL/hr over 120 Minutes Intravenous Every 24 hours 05/27/20 1456     05/28/20 0000  ceFAZolin (ANCEF) IVPB 2g/100 mL premix       Note to Pharmacy: To be given in specials   2 g 200 mL/hr over 30 Minutes Intravenous  Once 05/27/20 1556 05/27/20 1710   05/27/20 1800  vancomycin (VANCOREADY) IVPB 1750 mg/350 mL        1,750 mg 175 mL/hr over 120 Minutes Intravenous  Once 05/27/20 1539     05/27/20 1600  vancomycin (VANCOREADY) IVPB 1750 mg/350 mL  Status:  Discontinued        1,750 mg 175 mL/hr over 120 Minutes Intravenous  Once 05/27/20 1456 05/27/20 1539   05/27/20 1500  [MAR Hold]  cefTRIAXone (ROCEPHIN) 2 g in sodium chloride 0.9 % 100 mL IVPB        (MAR Hold since Fri 05/27/2020 at 1525.Hold Reason:  Transfer to a Procedural area.)   2 g 200 mL/hr over 30 Minutes Intravenous Every 24 hours 05/27/20 1442        Assessment: Pharmacy consulted to start heparin for multiple tibial angioplasties left lower extremity. No bolus for the start, but can follow nomogram afterwards. No DOAC PTA.   Goal of Therapy:  Heparin level 0.3-0.7 units/ml Monitor platelets by anticoagulation protocol: Yes   Plan:  Start heparin infusion at 950 units/hr Check anti-Xa level in 6 hours and daily while on heparin Continue to monitor H&H and platelets  Ronnald Ramp, PharmD, BCPS 05/27/2020,5:59 PM

## 2020-05-27 NOTE — ED Notes (Signed)
See triage note, pt states here for bone infection to left foot pinky toe. Denies pain. Post op shoe to bilateral feet

## 2020-05-27 NOTE — Consult Note (Signed)
Reason for Consult: Gangrene left foot with osteomyelitis and multiple ulcerations bilateral. Referring Physician: Audelia ActonHowerter  Scott Howard is an 60 y.o. male.  HPI: This is a 60 year old male with significant history of diabetes with neuropathy who sustained an injury to both of his feet a couple of months ago while in [place].  He has been managed outpatient for management of the ulcerations.  Recent x-rays showed evidence for osteomyelitis in at least the fifth toe but possibly the fourth.  Was recommended that the patient present to the emergency department for admission where he could also be expedited as far as having his circulation checked out.  Patient presented earlier today and was able to have angiogram performed today.  Past Medical History:  Diagnosis Date  . Diabetes mellitus without complication (HCC)   . Hyperlipidemia    borderline  . Hypertension     Past Surgical History:  Procedure Laterality Date  . HAND SURGERY Left   . TONSILLECTOMY AND ADENOIDECTOMY      Family History  Problem Relation Age of Onset  . Cancer Mother   . Cancer Father   . Hypertension Brother   . Congestive Heart Failure Brother     Social History:  reports that he has never smoked. He has never used smokeless tobacco. He reports current alcohol use. He reports that he does not use drugs.  Allergies:  Allergies  Allergen Reactions  . Sulfa Antibiotics Rash  . Silvadene [Silver Sulfadiazine]     Medications:  Scheduled: . fentaNYL      . fentaNYL      . heparin sodium (porcine)      . insulin aspart  0-6 Units Subcutaneous Q6H  . midazolam      . sodium chloride flush  3 mL Intravenous Q12H    Results for orders placed or performed during the hospital encounter of 05/27/20 (from the past 48 hour(s))  Comprehensive metabolic panel     Status: Abnormal   Collection Time: 05/27/20 11:57 AM  Result Value Ref Range   Sodium 133 (L) 135 - 145 mmol/L    Potassium 4.5 3.5 - 5.1 mmol/L   Chloride 100 98 - 111 mmol/L   CO2 24 22 - 32 mmol/L   Glucose, Bld 139 (H) 70 - 99 mg/dL    Comment: Glucose reference range applies only to samples taken after fasting for at least 8 hours.   BUN 22 (H) 6 - 20 mg/dL   Creatinine, Ser 0.981.06 0.61 - 1.24 mg/dL   Calcium 9.2 8.9 - 11.910.3 mg/dL   Total Protein 8.0 6.5 - 8.1 g/dL   Albumin 3.4 (L) 3.5 - 5.0 g/dL   AST 10 (L) 15 - 41 U/L   ALT 10 0 - 44 U/L   Alkaline Phosphatase 72 38 - 126 U/L   Total Bilirubin 1.4 (H) 0.3 - 1.2 mg/dL   GFR, Estimated >14>60 >78>60 mL/min    Comment: (NOTE) Calculated using the CKD-EPI Creatinine Equation (2021)    Anion gap 9 5 - 15    Comment: Performed at [facility], [address] [place], [place] [address]  CBC with Differential     Status: Abnormal   Collection Time: 05/27/20 11:57 AM  Result Value Ref Range   WBC 7.4 4.0 - 10.5 K/uL   RBC 4.38 4.22 - 5.81 MIL/uL   Hemoglobin 12.7 (L) 13.0 - 17.0 g/dL   HCT 13.036.9 (L) 86.539.0 - 78.452.0 %  MCV 84.2 80.0 - 100.0 fL   MCH 29.0 26.0 - 34.0 pg   MCHC 34.4 30.0 - 36.0 g/dL   RDW 64.4 03.4 - 74.2 %   Platelets 268 150 - 400 K/uL   nRBC 0.0 0.0 - 0.2 %   Neutrophils Relative % 66 %   Neutro Abs 5.1 1.7 - 7.7 K/uL   Lymphocytes Relative 22 %   Lymphs Abs 1.6 0.7 - 4.0 K/uL   Monocytes Relative 8 %   Monocytes Absolute 0.6 0.1 - 1.0 K/uL   Eosinophils Relative 2 %   Eosinophils Absolute 0.1 0.0 - 0.5 K/uL   Basophils Relative 1 %   Basophils Absolute 0.0 0.0 - 0.1 K/uL   Immature Granulocytes 1 %   Abs Immature Granulocytes 0.04 0.00 - 0.07 K/uL    Comment: Performed at [facility], [address] [place], [place] [address]  Sedimentation rate     Status: Abnormal   Collection Time: 05/27/20 11:57 AM  Result Value Ref Range   Sed Rate 83 (H) 0 - 20 mm/hr    Comment: Performed at [facility], [address], [place], [place] [address]  Magnesium     Status: Abnormal   Collection Time:  05/27/20 11:57 AM  Result Value Ref Range   Magnesium 1.6 (L) 1.7 - 2.4 mg/dL    Comment: Performed at [facility], [address], [place], [place] [address]  Resp Panel by RT-PCR (Flu A&B, Covid) Nasopharyngeal Swab     Status: None   Collection Time: 05/27/20  1:44 PM   Specimen: Nasopharyngeal Swab; Nasopharyngeal(NP) swabs in vial transport medium  Result Value Ref Range   SARS Coronavirus 2 by RT PCR NEGATIVE NEGATIVE    Comment: (NOTE) SARS-CoV-2 target nucleic acids are NOT DETECTED.  The SARS-CoV-2 RNA is generally detectable in upper respiratory specimens during the acute phase of infection. The lowest concentration of SARS-CoV-2 viral copies this assay can detect is 138 copies/mL. A negative result does not preclude SARS-Cov-2 infection and should not be used as the sole basis for treatment or other patient management decisions. A negative result may occur with  improper specimen collection/handling, submission of specimen other than nasopharyngeal swab, presence of viral mutation(s) within the areas targeted by this assay, and inadequate number of viral copies(<138 copies/mL). A negative result must be combined with clinical observations, patient history, and epidemiological information. The expected result is Negative.  Fact Sheet for Patients:  BloggerCourse.com  Fact Sheet for Healthcare Providers:  SeriousBroker.it  This test is no t yet approved or cleared by the [place] FDA and  has been authorized for detection and/or diagnosis of SARS-CoV-2 by FDA under an Emergency Use Authorization (EUA). This EUA will remain  in effect (meaning this test can be used) for the duration of the COVID-19 declaration under Section 564(b)(1) of the Act, 21 U.S.C.section 360bbb-3(b)(1), unless the authorization is terminated  or revoked sooner.       Influenza A by PCR NEGATIVE NEGATIVE   Influenza B by PCR  NEGATIVE NEGATIVE    Comment: (NOTE) The Xpert Xpress SARS-CoV-2/FLU/RSV plus assay is intended as an aid in the diagnosis of influenza from Nasopharyngeal swab specimens and should not be used as a sole basis for treatment. Nasal washings and aspirates are unacceptable for Xpert Xpress SARS-CoV-2/FLU/RSV testing.  Fact Sheet for Patients: BloggerCourse.com  Fact Sheet for Healthcare Providers: SeriousBroker.it  This test is not yet approved or cleared by the [place] FDA  and has been authorized for detection and/or diagnosis of SARS-CoV-2 by FDA under an Emergency Use Authorization (EUA). This EUA will remain in effect (meaning this test can be used) for the duration of the COVID-19 declaration under Section 564(b)(1) of the Act, 21 U.S.C. section 360bbb-3(b)(1), unless the authorization is terminated or revoked.  Performed at [facility], [address] [place], [place] [address]   Glucose, capillary     Status: None   Collection Time: 05/27/20  5:27 PM  Result Value Ref Range   Glucose-Capillary 99 70 - 99 mg/dL    Comment: Glucose reference range applies only to samples taken after fasting for at least 8 hours.    VAS [place] ABI WITH/WO TBI  Result Date: 05/26/2020  LOWER EXTREMITY DOPPLER STUDY Patient Name:  CAPONE SCHWINN  Date of Exam:   05/26/2020 Medical Rec #: [id number]       Accession #:    [id number] Date of Birth: 1960-08-04       Patient Gender: M Patient Age:   60Y Exam Location:  [facility] Procedure:      VAS [place] ABI WITH/WO TBI Referring Phys: [id number] Latina Craver SCHNIER --------------------------------------------------------------------------------  Indications: Peripheral artery disease.  Performing Technologist: Debbe Bales RVS  Examination Guidelines: A complete evaluation includes at minimum, Doppler waveform signals and systolic blood pressure reading at the level of bilateral  brachial, anterior tibial, and posterior tibial arteries, when vessel segments are accessible. Bilateral testing is considered an integral part of a complete examination. Photoelectric Plethysmograph (PPG) waveforms and toe systolic pressure readings are included as required and additional duplex testing as needed. Limited examinations for reoccurring indications may be performed as noted.  ABI Findings: +---------+------------------+-----+--------+--------+ Right    Rt Pressure (mmHg)IndexWaveformComment  +---------+------------------+-----+--------+--------+ Brachial 149                                     +---------+------------------+-----+--------+--------+ ATA      139               0.89 biphasic         +---------+------------------+-----+--------+--------+ PTA      193               1.23 biphasic         +---------+------------------+-----+--------+--------+ Great Toe250               1.59 Normal  [place]       +---------+------------------+-----+--------+--------+ +---------+------------------+-----+--------+-------+ Left     Lt Pressure (mmHg)IndexWaveformComment +---------+------------------+-----+--------+-------+ Brachial 157                                    +---------+------------------+-----+--------+-------+ ATA      250               1.59 biphasic        +---------+------------------+-----+--------+-------+ PTA      250               1.59 biphasic        +---------+------------------+-----+--------+-------+ Great Toe115               0.73 Normal          +---------+------------------+-----+--------+-------+ +-------+-----------+-----------+------------+------------+ ABI/TBIToday's ABIToday's TBIPrevious ABIPrevious TBI +-------+-----------+-----------+------------+------------+ Right  1.23       >1.0 [place]                             +-------+-----------+-----------+------------+------------+  Left   >1.0 [place]    .73                                  +-------+-----------+-----------+------------+------------+  Summary: Right: Resting right ankle-brachial index is within normal range. No evidence of significant right lower extremity arterial disease. The right toe-brachial index is normal. Left: Resting left ankle-brachial index indicates noncompressible left lower extremity arteries. The left toe-brachial index is normal.  *See table(s) above for measurements and observations.  Electronically signed by [name] MD on 05/26/2020 at 5:50:25 PM.    Final     Review of Systems  Constitutional: Negative for chills and fever.  HENT: Negative for congestion and sore throat.   Respiratory: Negative for cough and shortness of breath.   Cardiovascular: Negative for chest pain and palpitations.  Gastrointestinal: Negative for nausea and vomiting.  Endocrine: Negative for polydipsia and polyuria.  Genitourinary: Negative for frequency and urgency.  Musculoskeletal: Negative for arthralgias and myalgias.  Skin:       Patient relates chronic ulcerations on both of his feet that been going on for about the last 2 months.  Has had some skin grafting to the plantar ulcerations and applying Santyl to the other ulcers.  Neurological:       Significant neuropathy associated with his diabetes.  Psychiatric/Behavioral: Negative for behavioral problems. The patient is not nervous/anxious.    Blood pressure (!) 163/93, pulse 74, temperature 98.3 F (36.8 C), temperature source Oral, resp. rate 16, height 5\' 6"  (1.676 m), weight 69.9 kg, SpO2 99 %. Physical Exam Cardiovascular:     Comments: DP and PT pulses on the left are trace, barely palpable.  DP and PT pulses on the right are trace to +1. Musculoskeletal:     Comments: Adequate range of motion of the pedal joints.  Muscle testing deferred.  Skin:    Comments: The skin is warm dry and atrophic with absent hair growth.  Numerous unstageable ulcerations with eschar noted over the first  and fifth metatarsal head areas bilateral as well as the dorsal foot.  Gangrenous changes are noted to the left fifth toe.  Plantar ulcerations are noted bilateral.  Some erythema is noted surrounding most of the ulcerative sites.  The plantar ulcerations appear fairly clean.  Neurological:     Comments: Loss of sensation distally in the feet and toes.               Assessment/Plan: Assessment: 1.  Gangrene left fifth toe with osteomyelitis. 2.  Multiple unstageable ulcerations bilateral. 3.  Diabetes with associated neuropathy. 4.  Peripheral vascular disease.  Plan: Betadine and dressings applied to both feet.  Discussed with the patient that we will be waiting for results from the MRI to be performed hopefully later this evening.  Discussed with the patient that he will at least need amputation of the fifth toe with probable partial ray resection but could also include the fourth toe pending MRI results.  Discussed with the patient that with his circulation status and diabetes that these wounds may take some time to heal and no guarantees can be given.  I will follow tomorrow and discussed with the patient after his MRI.  05/27/2020, 6:52 PM

## 2020-05-27 NOTE — ED Triage Notes (Signed)
Pt to ER via POV. Pt advised to come to ER by Dr Excell Seltzer after x-ray shows patient has osteo in L pinky toe. Pt referred for angiogram and possible amputation.

## 2020-05-27 NOTE — Plan of Care (Signed)

## 2020-05-27 NOTE — ED Notes (Signed)
Informed RN bed assigned 

## 2020-05-27 NOTE — Consult Note (Signed)
Pharmacy Antibiotic Note  Scott Howard is a 60 y.o. male admitted on 05/27/2020 with Osteomyelitis.  Pharmacy has been consulted for vancomycin dosing.  Plan: Will give vancomycin 1750 mg loading dosing x 1 followed by vancomycin 1500 mg q24H for a predicted AUC of 497. Goal AUC 400-550. Used Scr 1.06. Plan to order vancomycin in the next 4-5 days if continued. Continue to monitor renal function.   Ceftriaxone 2 g daily.   Height: 5\' 6"  (167.6 cm) Weight: 70 kg (154 lb 5.2 oz) IBW/kg (Calculated) : 63.8  Temp (24hrs), Avg:98.5 F (36.9 C), Min:98.5 F (36.9 C), Max:98.5 F (36.9 C)  Recent Labs  Lab 05/27/20 1157  WBC 7.4  CREATININE 1.06    Estimated Creatinine Clearance: 66.9 mL/min (by C-G formula based on SCr of 1.06 mg/dL).    Allergies  Allergen Reactions  . Sulfa Antibiotics Rash    Antimicrobials this admission: 4/29  ceftriaxone >>  4/29 vancomycin >>   Dose adjustments this admission: None  Microbiology results: None  Thank you for allowing pharmacy to be a part of this patient's care.  5/29, PharmD, BCPS 05/27/2020 2:49 PM

## 2020-05-27 NOTE — Interval H&P Note (Signed)
History and Physical Interval Note:  05/27/2020 2:00 PM  Scott Howard  has presented today for surgery, with the diagnosis of Atherosclerotic disease with claudication.  The various methods of treatment have been discussed with the patient and family. After consideration of risks, benefits and other options for treatment, the patient has consented to  Procedure(s): Lower Extremity Angiography (Left) as a surgical intervention.  The patient's history has been reviewed, patient examined, no change in status, stable for surgery.  I have reviewed the patient's chart and labs.  Questions were answered to the patient's satisfaction.     [name]

## 2020-05-28 ENCOUNTER — Inpatient Hospital Stay: Payer: 59

## 2020-05-28 DIAGNOSIS — M86172 Other acute osteomyelitis, left ankle and foot: Secondary | ICD-10-CM | POA: Diagnosis not present

## 2020-05-28 DIAGNOSIS — I739 Peripheral vascular disease, unspecified: Secondary | ICD-10-CM

## 2020-05-28 DIAGNOSIS — E11621 Type 2 diabetes mellitus with foot ulcer: Secondary | ICD-10-CM

## 2020-05-28 DIAGNOSIS — L97519 Non-pressure chronic ulcer of other part of right foot with unspecified severity: Secondary | ICD-10-CM

## 2020-05-28 DIAGNOSIS — E1169 Type 2 diabetes mellitus with other specified complication: Secondary | ICD-10-CM | POA: Diagnosis not present

## 2020-05-28 DIAGNOSIS — E10621 Type 1 diabetes mellitus with foot ulcer: Secondary | ICD-10-CM

## 2020-05-28 DIAGNOSIS — I10 Essential (primary) hypertension: Secondary | ICD-10-CM | POA: Diagnosis not present

## 2020-05-28 LAB — URINALYSIS, COMPLETE (UACMP) WITH MICROSCOPIC
Bacteria, UA: NONE SEEN
Bilirubin Urine: NEGATIVE
Glucose, UA: 50 mg/dL — AB
Hgb urine dipstick: NEGATIVE
Ketones, ur: NEGATIVE mg/dL
Leukocytes,Ua: NEGATIVE
Nitrite: NEGATIVE
Protein, ur: NEGATIVE mg/dL
Specific Gravity, Urine: 1.033 — ABNORMAL HIGH (ref 1.005–1.030)
pH: 5 (ref 5.0–8.0)

## 2020-05-28 LAB — HEMOGLOBIN A1C
Hgb A1c MFr Bld: 6.7 % — ABNORMAL HIGH (ref 4.8–5.6)
Mean Plasma Glucose: 145.59 mg/dL

## 2020-05-28 LAB — HEPARIN LEVEL (UNFRACTIONATED)
Heparin Unfractionated: <0.1 IU/mL — ABNORMAL LOW (ref 0.30–0.70)
Heparin Unfractionated: 0.11 IU/mL — ABNORMAL LOW (ref 0.30–0.70)
Heparin Unfractionated: 0.25 IU/mL — ABNORMAL LOW (ref 0.30–0.70)

## 2020-05-28 LAB — GLUCOSE, CAPILLARY
Glucose-Capillary: 112 mg/dL — ABNORMAL HIGH (ref 70–99)
Glucose-Capillary: 137 mg/dL — ABNORMAL HIGH (ref 70–99)
Glucose-Capillary: 137 mg/dL — ABNORMAL HIGH (ref 70–99)
Glucose-Capillary: 148 mg/dL — ABNORMAL HIGH (ref 70–99)

## 2020-05-28 LAB — BASIC METABOLIC PANEL
Anion gap: 7 (ref 5–15)
BUN: 25 mg/dL — ABNORMAL HIGH (ref 6–20)
CO2: 24 mmol/L (ref 22–32)
Calcium: 8.7 mg/dL — ABNORMAL LOW (ref 8.9–10.3)
Chloride: 105 mmol/L (ref 98–111)
Creatinine, Ser: 1.09 mg/dL (ref 0.61–1.24)
GFR, Estimated: >60 mL/min (ref >60)
Glucose, Bld: 143 mg/dL — ABNORMAL HIGH (ref 70–99)
Potassium: 4.5 mmol/L (ref 3.5–5.1)
Sodium: 136 mmol/L (ref 135–145)

## 2020-05-28 LAB — CBC
HCT: 33 % — ABNORMAL LOW (ref 39.0–52.0)
Hemoglobin: 11.5 g/dL — ABNORMAL LOW (ref 13.0–17.0)
MCH: 29.6 pg (ref 26.0–34.0)
MCHC: 34.8 g/dL (ref 30.0–36.0)
MCV: 85.1 fL (ref 80.0–100.0)
Platelets: 236 10*3/uL (ref 150–400)
RBC: 3.88 MIL/uL — ABNORMAL LOW (ref 4.22–5.81)
RDW: 11.7 % (ref 11.5–15.5)
WBC: 7.1 10*3/uL (ref 4.0–10.5)
nRBC: 0 % (ref 0.0–0.2)

## 2020-05-28 LAB — MAGNESIUM: Magnesium: 1.6 mg/dL — ABNORMAL LOW (ref 1.7–2.4)

## 2020-05-28 LAB — HIV ANTIBODY (ROUTINE TESTING W REFLEX): HIV Screen 4th Generation wRfx: NONREACTIVE

## 2020-05-28 MED ORDER — PRAVASTATIN SODIUM 20 MG PO TABS
20.0000 mg | ORAL_TABLET | Freq: Every day | ORAL | Status: DC
  Administered 2020-05-28 – 2020-05-31 (×4): 20 mg via ORAL
  Filled 2020-05-28 (×4): qty 1

## 2020-05-28 MED ORDER — COLLAGENASE 250 UNIT/GM EX OINT
TOPICAL_OINTMENT | Freq: Every day | CUTANEOUS | Status: DC
  Filled 2020-05-28: qty 30

## 2020-05-28 MED ORDER — LISINOPRIL 10 MG PO TABS: 10.0000 mg | ORAL_TABLET | Freq: Every day | ORAL | Status: DC

## 2020-05-28 MED ORDER — HEPARIN (PORCINE) 25000 UT/250ML-% IV SOLN
1550.0000 [IU]/h | INTRAVENOUS | Status: AC
  Administered 2020-05-28: 1400 [IU]/h via INTRAVENOUS
  Filled 2020-05-28: qty 250

## 2020-05-28 MED ORDER — MAGNESIUM SULFATE 2 GM/50ML IV SOLN
2.0000 g | Freq: Once | INTRAVENOUS | Status: AC
  Administered 2020-05-28: 2 g via INTRAVENOUS
  Filled 2020-05-28: qty 50

## 2020-05-28 MED ORDER — HEPARIN BOLUS VIA INFUSION
2000.0000 [IU] | Freq: Once | INTRAVENOUS | Status: AC
  Administered 2020-05-28: 2000 [IU] via INTRAVENOUS
  Filled 2020-05-28: qty 2000

## 2020-05-28 MED ORDER — HEPARIN BOLUS VIA INFUSION
2100.0000 [IU] | Freq: Once | INTRAVENOUS | Status: AC
  Administered 2020-05-28: 2100 [IU] via INTRAVENOUS
  Filled 2020-05-28: qty 2100

## 2020-05-28 MED ORDER — HEPARIN BOLUS VIA INFUSION
1050.0000 [IU] | Freq: Once | INTRAVENOUS | Status: AC
  Administered 2020-05-28: 1050 [IU] via INTRAVENOUS
  Filled 2020-05-28: qty 1050

## 2020-05-28 MED ORDER — LISINOPRIL 10 MG PO TABS
10.0000 mg | ORAL_TABLET | Freq: Every day | ORAL | Status: DC
  Administered 2020-05-28 – 2020-06-01 (×5): 10 mg via ORAL
  Filled 2020-05-28 (×5): qty 1
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
## 2020-05-28 NOTE — Consult Note (Signed)
ANTICOAGULATION CONSULT NOTE   Pharmacy Consult for Heparin Indication: Multiple tibial angioplasties left lower extremity  Allergies  Allergen Reactions  . Sulfa Antibiotics Rash  . Silvadene [Silver Sulfadiazine]     Patient Measurements: Height: 5\' 6"  (167.6 cm) Weight: 69.9 kg (154 lb) IBW/kg (Calculated) : 63.8 Heparin Dosing Weight: 69.9 kg  Vital Signs: Temp: 98 F (36.7 C) (04/29 2123) Temp Source: Oral (04/29 1816) BP: 119/70 (04/29 2123) Pulse Rate: 73 (04/29 2123)  Labs: Recent Labs    05/27/20 1157 05/27/20 1925 05/28/20 0150  HGB 12.7*  --   --   HCT 36.9*  --   --   PLT 268  --   --   LABPROT  --  13.5  --   INR  --  1.0  --   HEPARINUNFRC  --   --  <0.10*  CREATININE 1.06  --   --     Estimated Creatinine Clearance: 66.9 mL/min (by C-G formula based on SCr of 1.06 mg/dL).   Medical History: Past Medical History:  Diagnosis Date  . Diabetes mellitus without complication (HCC)   . Hyperlipidemia    borderline  . Hypertension     Medications:  Medications Prior to Admission  Medication Sig Dispense Refill Last Dose  . doxycycline (VIBRA-TABS) 100 MG tablet Take 100 mg by mouth 2 (two) times daily.   05/26/2020 at 2000  . glipiZIDE (GLUCOTROL) 5 MG tablet Take 5 mg by mouth 2 (two) times daily.   05/26/2020 at 2000  . ibuprofen (ADVIL) 800 MG tablet Take 800 mg by mouth every 8 (eight) hours as needed.   Past Week at Unknown time  . lisinopril (ZESTRIL) 10 MG tablet Take 1 tablet by mouth daily.   05/26/2020 at 1200  . pravastatin (PRAVACHOL) 20 MG tablet Take 20 mg by mouth at bedtime.   05/26/2020 at 2000  . SANTYL ointment Apply topically daily.   Past Week at Unknown time   Scheduled:  . heparin  2,100 Units Intravenous Once  . insulin aspart  0-6 Units Subcutaneous Q6H  . sodium chloride flush  3 mL Intravenous Q12H   Infusions:  . sodium chloride 75 mL/hr at 05/28/20 0313  . sodium chloride    . cefTRIAXone (ROCEPHIN)  IV    . heparin  950 Units/hr (05/27/20 1814)  . vancomycin     PRN: sodium chloride, acetaminophen **OR** acetaminophen, acetaminophen, fentaNYL (SUBLIMAZE) injection, hydrALAZINE, HYDROmorphone (DILAUDID) injection, labetalol, morphine injection, ondansetron (ZOFRAN) IV, ondansetron (ZOFRAN) IV, ondansetron (ZOFRAN) IV, oxyCODONE, sodium chloride flush Anti-infectives (From admission, onward)   Start     Dose/Rate Route Frequency Ordered Stop   05/28/20 2000  vancomycin (VANCOREADY) IVPB 1500 mg/300 mL        1,500 mg 150 mL/hr over 120 Minutes Intravenous Every 24 hours 05/27/20 1456     05/28/20 0000  ceFAZolin (ANCEF) IVPB 2g/100 mL premix       Note to Pharmacy: To be given in specials   2 g 200 mL/hr over 30 Minutes Intravenous  Once 05/27/20 1556 05/27/20 1710   05/27/20 1800  vancomycin (VANCOREADY) IVPB 1750 mg/350 mL        1,750 mg 175 mL/hr over 120 Minutes Intravenous  Once 05/27/20 1539 05/27/20 2220   05/27/20 1600  vancomycin (VANCOREADY) IVPB 1750 mg/350 mL  Status:  Discontinued        1,750 mg 175 mL/hr over 120 Minutes Intravenous  Once 05/27/20 1456 05/27/20 1539   05/27/20 1500  cefTRIAXone (ROCEPHIN) 2 g in sodium chloride 0.9 % 100 mL IVPB        2 g 200 mL/hr over 30 Minutes Intravenous Every 24 hours 05/27/20 1442        Assessment: Pharmacy consulted to start heparin for multiple tibial angioplasties left lower extremity. No bolus for the start, but can follow nomogram afterwards. No DOAC PTA.   Goal of Therapy:  Heparin level 0.3-0.7 units/ml Monitor platelets by anticoagulation protocol: Yes   Plan:  4/30:  HL @ 0150 = < 0.1  Will order heparin 2100 units IV X 1 bolus and increase drip rate to 1200 units/hr.  Will recheck HL 6 hrs after rate change.   Loistine Eberlin D, PharmD 05/28/2020,4:59 AM

## 2020-05-28 NOTE — Consult Note (Signed)
ANTICOAGULATION CONSULT NOTE   Pharmacy Consult for Heparin Indication: Multiple tibial angioplasties left lower extremity  Patient Measurements: Heparin Dosing Weight: 69.9 kg  Labs: Recent Labs    05/27/20 1157 05/27/20 1925 05/28/20 0150 05/28/20 0434 05/28/20 1402  HGB 12.7*  --   --  11.5*  --   HCT 36.9*  --   --  33.0*  --   PLT 268  --   --  236  --   LABPROT  --  13.5  --   --   --   INR  --  1.0  --   --   --   HEPARINUNFRC  --   --  <0.10*  --  0.11*  CREATININE 1.06  --   --  1.09  --     Estimated Creatinine Clearance: 65 mL/min (by C-G formula based on SCr of 1.09 mg/dL).   Medical History: Past Medical History:  Diagnosis Date  . Diabetes mellitus without complication (HCC)   . Hyperlipidemia    borderline  . Hypertension     Assessment: Pharmacy consulted to start heparin for multiple tibial angioplasties left lower extremity. No bolus for the start, but can follow nomogram afterwards. No DOAC PTA.   4/30 at 0150 HL < 0.10; 950 units/hr 4/30 at 1402 HL 0.11; 1200 units/hr  Goal of Therapy:  Heparin level 0.3-0.7 units/ml Monitor platelets by anticoagulation protocol: Yes   Plan:  --Heparin level is subtherapeutic. Will order heparin 2000 unit IV bolus x 1 and increase drip rate to 1400 units/hr --Re-check HL 6 hours from rate change --Daily CBC per protocol while on IV heparin  Tressie Ellis 05/28/2020,3:15 PM

## 2020-05-28 NOTE — Progress Notes (Signed)
No acute events overnight vital signs stable. Observation continues.

## 2020-05-28 NOTE — Plan of Care (Signed)

## 2020-05-28 NOTE — Progress Notes (Signed)
1 Day Post-Op   Subjective/Chief Complaint: Patient seen.  No significant pain with the feet.  Has a lot of questions about upcoming procedure and postoperative care.   Objective: Vital signs in last 24 hours: Temp:  [98 F (36.7 C)-98.6 F (37 C)] 98.6 F (37 C) (04/30 6314) Pulse Rate:  [66-91] 78 (04/30 0938) Resp:  [6-23] 18 (04/30 0938) BP: (83-163)/(52-93) 157/77 (04/30 0938) SpO2:  [93 %-100 %] 94 % (04/30 0938) Weight:  [69.9 kg-70.9 kg] 70.9 kg (04/30 0518)    Intake/Output from previous day: 04/29 0701 - 04/30 0700 In: 1290.3 [I.V.:890.3; IV Piggyback:400] Out: -  Intake/Output this shift: No intake/output data recorded.  Dressings are dry and intact on both feet.  MRI results were reviewed and discussed with the patient.  This did reveal infection in the fourth and fifth toes as well as the majority of the fifth metatarsal.  Possibly a small early area in the fourth metatarsal head.  Lab Results:  Recent Labs    05/27/20 1157 05/28/20 0434  WBC 7.4 7.1  HGB 12.7* 11.5*  HCT 36.9* 33.0*  PLT 268 236   BMET Recent Labs    05/27/20 1157 05/28/20 0434  NA 133* 136  K 4.5 4.5  CL 100 105  CO2 24 24  GLUCOSE 139* 143*  BUN 22* 25*  CREATININE 1.06 1.09  CALCIUM 9.2 8.7*   PT/INR Recent Labs    05/27/20 1925  LABPROT 13.5  INR 1.0   ABG No results for input(s): PHART, HCO3 in the last 72 hours.  Invalid input(s): PCO2, PO2  Studies/Results: MR FOOT LEFT WO CONTRAST  Result Date: 05/28/2020 CLINICAL DATA:  60 year old male with significant history of diabetes with neuropathy who sustained an injury to both of his feet a couple of months ago while in [place] walking on a hot boardwalk. Possible osteomyelitis of the fourth and fifth toes on recent x-ray. EXAM: MRI OF THE LEFT FOOT WITHOUT CONTRAST TECHNIQUE: Multiplanar, multisequence MR imaging of the left foot was performed. No intravenous contrast was administered. COMPARISON:  None. FINDINGS:  Bones/Joint/Cartilage Severe bone marrow edema throughout the fifth metatarsal, fifth proximal phalanx and fifth distal phalanx most concerning for osteomyelitis. Fifth PIP joint effusion which may be reactive versus reflective of septic arthritis. Bone marrow edema in the fourth proximal phalanx, fourth middle phalanx and possibly fourth distal phalanx also concerning for osteomyelitis. Mild bone marrow edema in the shaft of the fourth metatarsal. Mild bone marrow edema at the base of the third metatarsal. Bone marrow edema in the first metatarsal head extending into the proximal shaft and at the base of the first proximal phalanx which may reflect stress reaction versus osteomyelitis. No acute fracture or dislocation. Normal alignment. No other joint effusion. Ligaments Collateral ligaments are intact.  Lisfranc ligament is intact. Muscles and Tendons Flexor, peroneal and extensor compartment tendons are intact. Generalized edema throughout the plantar musculature which may be reactive versus secondary to myositis. Soft tissue No fluid collection or hematoma. No soft tissue mass. Generalized soft tissue edema along the dorsal aspect of the foot. IMPRESSION: 1. Severe bone marrow edema throughout the fifth metatarsal, fifth proximal phalanx and fifth distal phalanx most concerning for osteomyelitis. Fifth PIP joint effusion which may be reactive versus reflective of septic arthritis. 2. Bone marrow edema in the fourth proximal phalanx, fourth middle phalanx and possibly fourth distal phalanx also concerning for osteomyelitis versus stress reaction. 3. Mild bone marrow edema in the shaft of the fourth metatarsal.  Mild bone marrow edema at the base of the third metatarsal. The changes may reflect mild stress reaction versus early osteomyelitis. 4. Bone marrow edema in the first metatarsal head extending into the proximal shaft and at the base of the first proximal phalanx which may reflect stress reaction versus  osteomyelitis. Electronically Signed   By: Elige KoHetal  Patel   On: 05/28/2020 08:04   PERIPHERAL VASCULAR CATHETERIZATION  Result Date: 05/27/2020 See op note   VAS US ABI WITH/WO TBI  Result Date: 05/26/2020  LOWER EXTREMITY DOPPLER STUDY Patient Name:  Scott Howard  Date of Exam:   05/26/2020 Medical Rec #: [id number]       Accession #:    [id number] Date of Birth: 1961/01/11       Patient Gender: M Patient Age:   60Y Exam Location:  [facility] Procedure:      VAS US ABI WITH/WO TBI Referring Phys: [id number] Latina CraverGREGORY G SCHNIER --------------------------------------------------------------------------------  Indications: Peripheral artery disease.  Performing Technologist: Debbe BalesSolomon Mcclary RVS  Examination Guidelines: A complete evaluation includes at minimum, Doppler waveform signals and systolic blood pressure reading at the level of bilateral brachial, anterior tibial, and posterior tibial arteries, when vessel segments are accessible. Bilateral testing is considered an integral part of a complete examination. Photoelectric Plethysmograph (PPG) waveforms and toe systolic pressure readings are included as required and additional duplex testing as needed. Limited examinations for reoccurring indications may be performed as noted.  ABI Findings: +---------+------------------+-----+--------+--------+ Right    Rt Pressure (mmHg)IndexWaveformComment  +---------+------------------+-----+--------+--------+ Brachial 149                                     +---------+------------------+-----+--------+--------+ ATA      139               0.89 biphasic         +---------+------------------+-----+--------+--------+ PTA      193               1.23 biphasic         +---------+------------------+-----+--------+--------+ Great Toe250               1.59 Normal  [place]       +---------+------------------+-----+--------+--------+ +---------+------------------+-----+--------+-------+ Left      Lt Pressure (mmHg)IndexWaveformComment +---------+------------------+-----+--------+-------+ Brachial 157                                    +---------+------------------+-----+--------+-------+ ATA      250               1.59 biphasic        +---------+------------------+-----+--------+-------+ PTA      250               1.59 biphasic        +---------+------------------+-----+--------+-------+ Great Toe115               0.73 Normal          +---------+------------------+-----+--------+-------+ +-------+-----------+-----------+------------+------------+ ABI/TBIToday's ABIToday's TBIPrevious ABIPrevious TBI +-------+-----------+-----------+------------+------------+ Right  1.23       >1.0 [place]                             +-------+-----------+-----------+------------+------------+ Left   >1.0 [place]    .73                                 +-------+-----------+-----------+------------+------------+  Summary: Right: Resting right ankle-brachial index is within normal range. No evidence of significant right lower extremity arterial disease. The right toe-brachial index is normal. Left: Resting left ankle-brachial index indicates noncompressible left lower extremity arteries. The left toe-brachial index is normal.  *See table(s) above for measurements and observations.  Electronically signed by [name] MD on 05/26/2020 at 5:50:25 PM.    Final     Anti-infectives: Anti-infectives (From admission, onward)   Start     Dose/Rate Route Frequency Ordered Stop   05/28/20 2000  vancomycin (VANCOREADY) IVPB 1500 mg/300 mL        1,500 mg 150 mL/hr over 120 Minutes Intravenous Every 24 hours 05/27/20 1456     05/28/20 0000  ceFAZolin (ANCEF) IVPB 2g/100 mL premix       Note to Pharmacy: To be given in specials   2 g 200 mL/hr over 30 Minutes Intravenous  Once 05/27/20 1556 05/27/20 1710   05/27/20 1800  vancomycin (VANCOREADY) IVPB 1750 mg/350 mL        1,750 mg 175  mL/hr over 120 Minutes Intravenous  Once 05/27/20 1539 05/27/20 2220   05/27/20 1600  vancomycin (VANCOREADY) IVPB 1750 mg/350 mL  Status:  Discontinued        1,750 mg 175 mL/hr over 120 Minutes Intravenous  Once 05/27/20 1456 05/27/20 1539   05/27/20 1500  cefTRIAXone (ROCEPHIN) 2 g in sodium chloride 0.9 % 100 mL IVPB        2 g 200 mL/hr over 30 Minutes Intravenous Every 24 hours 05/27/20 1442        Assessment/Plan: s/p Procedure(s): Lower Extremity Angiography (Left) Assessment: Osteomyelitis left foot with gangrenous changes fifth toe.   Plan: Dressings left intact.  We will begin Santyl ointment and moistened gauze dressings to the ulcerations on the right foot.  Discussed with the patient surgery on the left foot due to the infected bone and gangrenous changes.  Discussed with the patient at this point I would like to start with fourth and fifth toe amputations with partial ray resections to remove infected bone and see how this heals.  Discussed with the patient that he has significant challenges to healing with his diabetes and circulation as well as the multiple other ulcerative areas on his foot.  Discussed that he may need to consider transmetatarsal amputation if tomorrow surgery does not appear to be healing well.  Discussed challenges with that including the plantar ulceration on his foot that would give challenges to wound closure potentially.  Also discussed that he is still at significant risk in the future for needing a higher amputation such as a below-knee amputation.  Discussed possible risk and complications of the procedure including but not limited to as mentioned above difficulty in healing due to continued infection, previously poorly controlled diabetes, and vascular disease even with recent intervention.  No guarantees can be given as to the outcome of the surgery and patient understands that this may require more than 1 debridement and again potential conversion to  transmetatarsal amputation.  Questions invited and answered.  Patient will be n.p.o. after midnight.  Consent form for amputation left fourth and fifth toes with partial ray resections.  Spoke with medicine about discontinuing his heparin drip at approximately 2:00 AM tomorrow morning.  Plan for surgery tomorrow morning.  Also discussed with the patient that while he is in the hospital I think we should consider trying to perform angiogram on his right lower extremity due to the numerous ulcerations  on the right foot.  Discussed with the patient also that on discharge he will most likely require a PICC line with IV antibiotics for 6 weeks depending on results from pathology and cultures.  Discussed that he will most likely need to be out of work for least a couple of months.  Patient voiced full understanding.  Again plan for surgery tomorrow morning.  LOS: 1 day    [name] 05/28/2020

## 2020-05-28 NOTE — Op Note (Signed)
[facility] Percutaneous Study/Intervention Procedural Note   Date of Surgery: 05/27/2020  Surgeon: Hortencia Pilar  Pre-operative Diagnosis: Atherosclerotic occlusive disease bilateral lower extremities with multiple ulcerations bilateral lower extremities  Post-operative diagnosis: Same  Procedure(s) Performed: 1. Introduction catheter into left lower extremity 3rd order catheter placement  2. Thrombectomy using the penumbra catheter left distal anterior tibial and dorsalis pedis  3. Percutaneous transluminal angioplasty left anterior tibial to 3 mm              4. Percutaneous transluminal angioplasty left posterior tibial to 3 mm  5. Star close closure right common femoral arteriotomy  Anesthesia: Conscious sedation was administered under my direct supervision by the interventional radiology RN. IV Versed plus fentanyl were utilized. Continuous ECG, pulse oximetry and blood pressure was monitored throughout the entire procedure.  Conscious sedation was for a total of 1 hour 29 minutes.  Sheath: 6 French Raby right common femoral retrograde  Contrast: 85 cc  Fluoroscopy Time: 13.8 minutes  Indications: Toya Smothers Slater presents with multiple ulcers of the bilateral lower extremities. This suggests the patient is having limb threatening ischemia. The risks and benefits are reviewed all questions answered patient agrees to proceed.  Procedure:Jaquis R Firmin is a 60 y.o. y.o. male who was identified and appropriate procedural time out was performed. The patient was then placed supine on the table and prepped and draped in the usual sterile fashion.   Ultrasound was placed in the sterile sleeve and the right groin was evaluated the right common femoral artery was echolucent and pulsatile indicating patency. Image was recorded for the permanent record and under real-time visualization a  microneedle was inserted into the common femoral artery followed by the microwire and then the micro-sheath. A J-wire was then advanced through the micro-sheath and a 5 [place] sheath was then inserted over a J-wire. J-wire was then advanced and a 5 French pigtail catheter was positioned at the level of T12.  AP projection of the aorta was then obtained. Pigtail catheter was repositioned to above the bifurcation and a RAO view of the pelvis was obtained. Subsequently a pigtail catheter with the stiff angle Glidewire was used to cross the aortic bifurcation the catheter wire were advanced down into the left distal external iliac artery. Oblique view of the femoral bifurcation was then obtained and subsequently the wire was reintroduced and the pigtail catheter negotiated into the SFA representing third order catheter placement. Distal runoff was then performed.  Diagnostic interpretation: The abdominal aorta is opacified with a bolus injection contrast.  There are mild atherosclerotic changes but there are no hemodynamically significant stenoses.  The aortic bifurcation is widely patent.  Bilateral common internal and external iliac arteries again show mild atherosclerotic changes but no hemodynamically significant stenoses.  The left common femoral profunda femoris superficial femoral and popliteal arteries demonstrate mild atherosclerotic changes but no hemodynamically significant stenoses are noted.  The trifurcation is patent.  The anterior tibial demonstrates 3 greater than 90% stenoses in the series in its proximal one third.  It is patent in its distal two thirds.  The dorsalis pedis demonstrates a subtotal occlusion.  The tibioperoneal trunk is patent with moderate atherosclerotic changes.  The origin of the anterior tibial demonstrates a greater than 80% stenoses.  Distally the posterior tibial has a second 80% stenosis at the level of the ankle.  The peroneal is patent but quite small and  collateralizes poorly across the foot.  5000 units of heparin was  then given and allowed to circulate and a 6 [place] Raby sheath was advanced up and over the bifurcation and positioned in the femoral artery  Kumpe catheter and advantage Glidewire were then negotiated down into the distal popliteal. Catheter was then advanced. Hand injection contrast demonstrated the tibial anatomy in detail.  Initially the wire is negotiated into the anterior tibial.  The 3 lesions proximally are crossed and then the wire catheter advanced down and the dorsalis pedis lesion is crossed.  Wire was exchanged for a 0.014 run-through.  Using a 2 mm balloon distally angioplasty of the dorsalis pedis was performed to 10 atm for 1 minute.  Next a 3 mm x 100 mm ultra score balloon is used to angioplasty the proximal lesions.  Follow-up imaging demonstrates that the proximal lesions are well treated with less than 10% residual stenosis.  However the dorsalis pedis lesion does not look significantly improved.  I again used a 2 mm balloon to inflate across this lesion but this did not improve the appearance.  At this point I prepped the penumbra CAT 3 catheter and multiple passes were made across the dorsalis pedis.  Subsequently some nitroglycerin was infused after which there was patency of the dorsalis pedis with approximately 20% residual stenosis.  I then elected to treat the posterior tibial.  Approximately a 3 mm x 40 mm ultra score balloon was used inflation was to 10 atm for 1 minute.  Follow-up imaging demonstrated less than 10% residual stenosis and I proceeded to treat the distal lesion with a 2.5 x 40 mm ultra score balloon.  Angioplasty was to 10 atm for 1 minute.  Follow-up imaging now demonstrated wide patency of the posterior tibial with less than 10% residual stenosis throughout its course and filling of the plantar vessels and the pedal arch.    At this point I elected to terminate the case.  After review of these  images the sheath is pulled into the right external iliac oblique of the common femoral is obtained and a Star close device deployed. There no immediate Complications.  Findings:  The abdominal aorta is opacified with a bolus injection contrast.  There are mild atherosclerotic changes but there are no hemodynamically significant stenoses.  The aortic bifurcation is widely patent.  Bilateral common internal and external iliac arteries again show mild atherosclerotic changes but no hemodynamically significant stenoses.  The left common femoral profunda femoris superficial femoral and popliteal arteries demonstrate mild atherosclerotic changes but no hemodynamically significant stenoses are noted.  The trifurcation is patent.  The anterior tibial demonstrates 3 greater than 90% stenoses in the series in its proximal one third.  It is patent in its distal two thirds.  The dorsalis pedis demonstrates a subtotal occlusion.  The tibioperoneal trunk is patent with moderate atherosclerotic changes.  The origin of the anterior tibial demonstrates a greater than 80% stenoses.  Distally the posterior tibial has a second 80% stenosis at the level of the ankle.  The peroneal is patent but quite small and collateralizes poorly across the foot.  Following angioplasty of the anterior tibial there is less than 5% residual stenosis proximally.  After angioplasty and thrombectomy of the dorsalis pedis there is now less than 20% residual stenosis with patency.  Following angioplasty of the posterior tibial as described above there is less than 10% residual stenosis with wide patency down to the foot.   Summary: Successful recanalization left lower extremity for limb salvage   Disposition: Patient was taken to  the recovery room in stable condition having tolerated the procedure well.  Belenda Cruise Camy Leder 05/28/2020,11:41 AM

## 2020-05-28 NOTE — Progress Notes (Signed)
Pending heparin results lab called, technician stated blood sample is still processing.

## 2020-05-28 NOTE — H&P (View-Only) (Signed)
1 Day Post-Op   Subjective/Chief Complaint: Patient seen.  No significant pain with the feet.  Has a lot of questions about upcoming procedure and postoperative care.   Objective: Vital signs in last 24 hours: Temp:  [98 F (36.7 C)-98.6 F (37 C)] 98.6 F (37 C) (04/30 6314) Pulse Rate:  [66-91] 78 (04/30 0938) Resp:  [6-23] 18 (04/30 0938) BP: (83-163)/(52-93) 157/77 (04/30 0938) SpO2:  [93 %-100 %] 94 % (04/30 0938) Weight:  [69.9 kg-70.9 kg] 70.9 kg (04/30 0518)    Intake/Output from previous day: 04/29 0701 - 04/30 0700 In: 1290.3 [I.V.:890.3; IV Piggyback:400] Out: -  Intake/Output this shift: No intake/output data recorded.  Dressings are dry and intact on both feet.  MRI results were reviewed and discussed with the patient.  This did reveal infection in the fourth and fifth toes as well as the majority of the fifth metatarsal.  Possibly a small early area in the fourth metatarsal head.  Lab Results:  Recent Labs    05/27/20 1157 05/28/20 0434  WBC 7.4 7.1  HGB 12.7* 11.5*  HCT 36.9* 33.0*  PLT 268 236   BMET Recent Labs    05/27/20 1157 05/28/20 0434  NA 133* 136  K 4.5 4.5  CL 100 105  CO2 24 24  GLUCOSE 139* 143*  BUN 22* 25*  CREATININE 1.06 1.09  CALCIUM 9.2 8.7*   PT/INR Recent Labs    05/27/20 1925  LABPROT 13.5  INR 1.0   ABG No results for input(s): PHART, HCO3 in the last 72 hours.  Invalid input(s): PCO2, PO2  Studies/Results: MR FOOT LEFT WO CONTRAST  Result Date: 05/28/2020 CLINICAL DATA:  60 year old male with significant history of diabetes with neuropathy who sustained an injury to both of his feet a couple of months ago while in [place] walking on a hot boardwalk. Possible osteomyelitis of the fourth and fifth toes on recent x-ray. EXAM: MRI OF THE LEFT FOOT WITHOUT CONTRAST TECHNIQUE: Multiplanar, multisequence MR imaging of the left foot was performed. No intravenous contrast was administered. COMPARISON:  None. FINDINGS:  Bones/Joint/Cartilage Severe bone marrow edema throughout the fifth metatarsal, fifth proximal phalanx and fifth distal phalanx most concerning for osteomyelitis. Fifth PIP joint effusion which may be reactive versus reflective of septic arthritis. Bone marrow edema in the fourth proximal phalanx, fourth middle phalanx and possibly fourth distal phalanx also concerning for osteomyelitis. Mild bone marrow edema in the shaft of the fourth metatarsal. Mild bone marrow edema at the base of the third metatarsal. Bone marrow edema in the first metatarsal head extending into the proximal shaft and at the base of the first proximal phalanx which may reflect stress reaction versus osteomyelitis. No acute fracture or dislocation. Normal alignment. No other joint effusion. Ligaments Collateral ligaments are intact.  Lisfranc ligament is intact. Muscles and Tendons Flexor, peroneal and extensor compartment tendons are intact. Generalized edema throughout the plantar musculature which may be reactive versus secondary to myositis. Soft tissue No fluid collection or hematoma. No soft tissue mass. Generalized soft tissue edema along the dorsal aspect of the foot. IMPRESSION: 1. Severe bone marrow edema throughout the fifth metatarsal, fifth proximal phalanx and fifth distal phalanx most concerning for osteomyelitis. Fifth PIP joint effusion which may be reactive versus reflective of septic arthritis. 2. Bone marrow edema in the fourth proximal phalanx, fourth middle phalanx and possibly fourth distal phalanx also concerning for osteomyelitis versus stress reaction. 3. Mild bone marrow edema in the shaft of the fourth metatarsal.  Mild bone marrow edema at the base of the third metatarsal. The changes may reflect mild stress reaction versus early osteomyelitis. 4. Bone marrow edema in the first metatarsal head extending into the proximal shaft and at the base of the first proximal phalanx which may reflect stress reaction versus  osteomyelitis. Electronically Signed   By: Hetal  Patel   On: 05/28/2020 08:04   PERIPHERAL VASCULAR CATHETERIZATION  Result Date: 05/27/2020 See op note   VAS US ABI WITH/WO TBI  Result Date: 05/26/2020  LOWER EXTREMITY DOPPLER STUDY Patient Name:  Scott Howard  Date of Exam:   05/26/2020 Medical Rec #: [id number]       Accession #:    [id number] Date of Birth: 05/16/1960       Patient Gender: M Patient Age:   60Y Exam Location:  [facility] Procedure:      VAS US ABI WITH/WO TBI Referring Phys: [id number] GREGORY G SCHNIER --------------------------------------------------------------------------------  Indications: Peripheral artery disease.  Performing Technologist: Solomon Mcclary RVS  Examination Guidelines: A complete evaluation includes at minimum, Doppler waveform signals and systolic blood pressure reading at the level of bilateral brachial, anterior tibial, and posterior tibial arteries, when vessel segments are accessible. Bilateral testing is considered an integral part of a complete examination. Photoelectric Plethysmograph (PPG) waveforms and toe systolic pressure readings are included as required and additional duplex testing as needed. Limited examinations for reoccurring indications may be performed as noted.  ABI Findings: +---------+------------------+-----+--------+--------+ Right    Rt Pressure (mmHg)IndexWaveformComment  +---------+------------------+-----+--------+--------+ Brachial 149                                     +---------+------------------+-----+--------+--------+ ATA      139               0.89 biphasic         +---------+------------------+-----+--------+--------+ PTA      193               1.23 biphasic         +---------+------------------+-----+--------+--------+ Great Toe250               1.59 Normal  [place]       +---------+------------------+-----+--------+--------+ +---------+------------------+-----+--------+-------+ Left      Lt Pressure (mmHg)IndexWaveformComment +---------+------------------+-----+--------+-------+ Brachial 157                                    +---------+------------------+-----+--------+-------+ ATA      250               1.59 biphasic        +---------+------------------+-----+--------+-------+ PTA      250               1.59 biphasic        +---------+------------------+-----+--------+-------+ Great Toe115               0.73 Normal          +---------+------------------+-----+--------+-------+ +-------+-----------+-----------+------------+------------+ ABI/TBIToday's ABIToday's TBIPrevious ABIPrevious TBI +-------+-----------+-----------+------------+------------+ Right  1.23       >1.0 [place]                             +-------+-----------+-----------+------------+------------+ Left   >1.0     .73                                 +-------+-----------+-----------+------------+------------+    Summary: Right: Resting right ankle-brachial index is within normal range. No evidence of significant right lower extremity arterial disease. The right toe-brachial index is normal. Left: Resting left ankle-brachial index indicates noncompressible left lower extremity arteries. The left toe-brachial index is normal.  *See table(s) above for measurements and observations.  Electronically signed by [name] MD on 05/26/2020 at 5:50:25 PM.    Final     Anti-infectives: Anti-infectives (From admission, onward)   Start     Dose/Rate Route Frequency Ordered Stop   05/28/20 2000  vancomycin (VANCOREADY) IVPB 1500 mg/300 mL        1,500 mg 150 mL/hr over 120 Minutes Intravenous Every 24 hours 05/27/20 1456     05/28/20 0000  ceFAZolin (ANCEF) IVPB 2g/100 mL premix       Note to Pharmacy: To be given in specials   2 g 200 mL/hr over 30 Minutes Intravenous  Once 05/27/20 1556 05/27/20 1710   05/27/20 1800  vancomycin (VANCOREADY) IVPB 1750 mg/350 mL        1,750 mg 175  mL/hr over 120 Minutes Intravenous  Once 05/27/20 1539 05/27/20 2220   05/27/20 1600  vancomycin (VANCOREADY) IVPB 1750 mg/350 mL  Status:  Discontinued        1,750 mg 175 mL/hr over 120 Minutes Intravenous  Once 05/27/20 1456 05/27/20 1539   05/27/20 1500  cefTRIAXone (ROCEPHIN) 2 g in sodium chloride 0.9 % 100 mL IVPB        2 g 200 mL/hr over 30 Minutes Intravenous Every 24 hours 05/27/20 1442        Assessment/Plan: s/p Procedure(s): Lower Extremity Angiography (Left) Assessment: Osteomyelitis left foot with gangrenous changes fifth toe.   Plan: Dressings left intact.  We will begin Santyl ointment and moistened gauze dressings to the ulcerations on the right foot.  Discussed with the patient surgery on the left foot due to the infected bone and gangrenous changes.  Discussed with the patient at this point I would like to start with fourth and fifth toe amputations with partial ray resections to remove infected bone and see how this heals.  Discussed with the patient that he has significant challenges to healing with his diabetes and circulation as well as the multiple other ulcerative areas on his foot.  Discussed that he may need to consider transmetatarsal amputation if tomorrow surgery does not appear to be healing well.  Discussed challenges with that including the plantar ulceration on his foot that would give challenges to wound closure potentially.  Also discussed that he is still at significant risk in the future for needing a higher amputation such as a below-knee amputation.  Discussed possible risk and complications of the procedure including but not limited to as mentioned above difficulty in healing due to continued infection, previously poorly controlled diabetes, and vascular disease even with recent intervention.  No guarantees can be given as to the outcome of the surgery and patient understands that this may require more than 1 debridement and again potential conversion to  transmetatarsal amputation.  Questions invited and answered.  Patient will be n.p.o. after midnight.  Consent form for amputation left fourth and fifth toes with partial ray resections.  Spoke with medicine about discontinuing his heparin drip at approximately 2:00 AM tomorrow morning.  Plan for surgery tomorrow morning.  Also discussed with the patient that while he is in the hospital I think we should consider trying to perform angiogram on his right lower extremity due to the numerous ulcerations  on the right foot.  Discussed with the patient also that on discharge he will most likely require a PICC line with IV antibiotics for 6 weeks depending on results from pathology and cultures.  Discussed that he will most likely need to be out of work for least a couple of months.  Patient voiced full understanding.  Again plan for surgery tomorrow morning.  LOS: 1 day    [name] 05/28/2020

## 2020-05-29 ENCOUNTER — Inpatient Hospital Stay: Payer: 59 | Admitting: Anesthesiology

## 2020-05-29 ENCOUNTER — Encounter
Admission: EM | Disposition: A | Discharge status: Home / Self Care | Payer: Self-pay | Source: Home / Self Care | Attending: Internal Medicine

## 2020-05-29 DIAGNOSIS — L97511 Non-pressure chronic ulcer of other part of right foot limited to breakdown of skin: Secondary | ICD-10-CM

## 2020-05-29 DIAGNOSIS — I739 Peripheral vascular disease, unspecified: Secondary | ICD-10-CM | POA: Diagnosis not present

## 2020-05-29 DIAGNOSIS — M86172 Other acute osteomyelitis, left ankle and foot: Secondary | ICD-10-CM

## 2020-05-29 DIAGNOSIS — E11621 Type 2 diabetes mellitus with foot ulcer: Secondary | ICD-10-CM | POA: Diagnosis not present

## 2020-05-29 DIAGNOSIS — E1169 Type 2 diabetes mellitus with other specified complication: Principal | ICD-10-CM

## 2020-05-29 DIAGNOSIS — E785 Hyperlipidemia, unspecified: Secondary | ICD-10-CM

## 2020-05-29 DIAGNOSIS — I10 Essential (primary) hypertension: Secondary | ICD-10-CM

## 2020-05-29 HISTORY — PX: AMPUTATION TOE: SHX6595

## 2020-05-29 LAB — GLUCOSE, CAPILLARY
Glucose-Capillary: 111 mg/dL — ABNORMAL HIGH (ref 70–99)
Glucose-Capillary: 115 mg/dL — ABNORMAL HIGH (ref 70–99)
Glucose-Capillary: 136 mg/dL — ABNORMAL HIGH (ref 70–99)
Glucose-Capillary: 160 mg/dL — ABNORMAL HIGH (ref 70–99)
Glucose-Capillary: 212 mg/dL — ABNORMAL HIGH (ref 70–99)
Glucose-Capillary: 270 mg/dL — ABNORMAL HIGH (ref 70–99)
Glucose-Capillary: 351 mg/dL — ABNORMAL HIGH (ref 70–99)

## 2020-05-29 SURGERY — AMPUTATION, TOE
Anesthesia: General | Site: Toe | Laterality: Left

## 2020-05-29 MED ORDER — PROPOFOL 10 MG/ML IV BOLUS
INTRAVENOUS | Status: DC | PRN
  Administered 2020-05-29: 160 mg via INTRAVENOUS

## 2020-05-29 MED ORDER — EPHEDRINE SULFATE 50 MG/ML IJ SOLN
INTRAMUSCULAR | Status: DC | PRN
  Administered 2020-05-29 (×2): 10 mg via INTRAVENOUS

## 2020-05-29 MED ORDER — NEOMYCIN-POLYMYXIN B GU 40-200000 IR SOLN
Status: DC | PRN
  Administered 2020-05-29: 2 mL

## 2020-05-29 MED ORDER — DEXAMETHASONE SODIUM PHOSPHATE 10 MG/ML IJ SOLN
INTRAMUSCULAR | Status: DC | PRN
  Administered 2020-05-29: 5 mg via INTRAVENOUS

## 2020-05-29 MED ORDER — SODIUM CHLORIDE 0.9 % IV SOLN: INTRAVENOUS | Status: DC | PRN

## 2020-05-29 MED ORDER — PHENYLEPHRINE HCL (PRESSORS) 10 MG/ML IV SOLN
INTRAVENOUS | Status: DC | PRN
  Administered 2020-05-29 (×3): 100 ug via INTRAVENOUS

## 2020-05-29 MED ORDER — VANCOMYCIN HCL 1000 MG IV SOLR
INTRAVENOUS | Status: DC | PRN
  Administered 2020-05-29: 1000 mg

## 2020-05-29 MED ORDER — LIDOCAINE HCL (CARDIAC) PF 100 MG/5ML IV SOSY
PREFILLED_SYRINGE | INTRAVENOUS | Status: DC | PRN
  Administered 2020-05-29: 100 mg via INTRAVENOUS

## 2020-05-29 MED ORDER — ONDANSETRON HCL 4 MG/2ML IJ SOLN
4.0000 mg | Freq: Once | INTRAMUSCULAR | Status: DC | PRN
Start: 2020-05-29 — End: 2020-05-29

## 2020-05-29 MED ORDER — FENTANYL CITRATE (PF) 100 MCG/2ML IJ SOLN
INTRAMUSCULAR | Status: AC
  Filled 2020-05-29: qty 2

## 2020-05-29 MED ORDER — MIDAZOLAM HCL 2 MG/2ML IJ SOLN
INTRAMUSCULAR | Status: AC
  Filled 2020-05-29: qty 2

## 2020-05-29 MED ORDER — MIDAZOLAM HCL 2 MG/2ML IJ SOLN
INTRAMUSCULAR | Status: DC | PRN
  Administered 2020-05-29: 2 mg via INTRAVENOUS

## 2020-05-29 MED ORDER — GLIPIZIDE ER 2.5 MG PO TB24
2.5000 mg | ORAL_TABLET | Freq: Every day | ORAL | Status: DC
  Administered 2020-05-30: 2.5 mg via ORAL
  Filled 2020-05-29 (×2): qty 1

## 2020-05-29 MED ORDER — PROPOFOL 10 MG/ML IV BOLUS
INTRAVENOUS | Status: AC
  Filled 2020-05-29: qty 20

## 2020-05-29 MED ORDER — INSULIN ASPART 100 UNIT/ML IJ SOLN
0.0000 [IU] | Freq: Three times a day (TID) | INTRAMUSCULAR | Status: DC
  Administered 2020-05-29: 2 [IU] via SUBCUTANEOUS
  Administered 2020-05-29: 9 [IU] via SUBCUTANEOUS
  Administered 2020-05-30 – 2020-06-01 (×4): 2 [IU] via SUBCUTANEOUS
  Filled 2020-05-29 (×6): qty 1

## 2020-05-29 MED ORDER — FENTANYL CITRATE (PF) 100 MCG/2ML IJ SOLN: 25.0000 ug | INTRAMUSCULAR | Status: DC | PRN

## 2020-05-29 MED ORDER — ONDANSETRON HCL 4 MG/2ML IJ SOLN
INTRAMUSCULAR | Status: DC | PRN
  Administered 2020-05-29: 4 mg via INTRAVENOUS

## 2020-05-29 MED ORDER — HEPARIN (PORCINE) 25000 UT/250ML-% IV SOLN
1750.0000 [IU]/h | INTRAVENOUS | Status: DC
  Administered 2020-05-29: 1550 [IU]/h via INTRAVENOUS
  Administered 2020-05-30 – 2020-05-31 (×2): 1750 [IU]/h via INTRAVENOUS
  Filled 2020-05-29 (×3): qty 250

## 2020-05-29 MED ORDER — INSULIN ASPART 100 UNIT/ML IJ SOLN
0.0000 [IU] | Freq: Every day | INTRAMUSCULAR | Status: DC
  Administered 2020-05-29: 3 [IU] via SUBCUTANEOUS
  Filled 2020-05-29: qty 1

## 2020-05-29 MED ORDER — BUPIVACAINE HCL 0.5 % IJ SOLN
INTRAMUSCULAR | Status: DC | PRN
  Administered 2020-05-29: 10 mL

## 2020-05-29 SURGICAL SUPPLY — 54 items
BLADE MED AGGRESSIVE (BLADE) ×2 IMPLANT
BLADE OSC/SAGITTAL MD 5.5X18 (BLADE) ×2 IMPLANT
BLADE SURG 15 STRL LF DISP TIS (BLADE) ×2 IMPLANT
BLADE SURG 15 STRL SS (BLADE) ×2
BLADE SURG MINI STRL (BLADE) ×2 IMPLANT
BNDG CONFORM 2 STRL LF (GAUZE/BANDAGES/DRESSINGS) ×2 IMPLANT
BNDG ELASTIC 4X5.8 VLCR STR LF (GAUZE/BANDAGES/DRESSINGS) ×2 IMPLANT
BNDG ESMARK 4X12 TAN STRL LF (GAUZE/BANDAGES/DRESSINGS) ×2 IMPLANT
BNDG GAUZE 4.5X4.1 6PLY STRL (MISCELLANEOUS) ×3 IMPLANT
BNDG STRETCH 4X75 STRL LF (GAUZE/BANDAGES/DRESSINGS) ×1 IMPLANT
CANISTER SUCT 1200ML W/VALVE (MISCELLANEOUS) ×2 IMPLANT
CNTNR SPEC 2.5X3XGRAD LEK (MISCELLANEOUS) ×1
CONT SPEC 4OZ STER OR WHT (MISCELLANEOUS) ×1
CONTAINER SPEC 2.5X3XGRAD LEK (MISCELLANEOUS) IMPLANT
COVER WAND RF STERILE (DRAPES) ×2 IMPLANT
CUFF TOURN SGL QUICK 12 (TOURNIQUET CUFF) ×2 IMPLANT
CUFF TOURN SGL QUICK 18X4 (TOURNIQUET CUFF) ×2 IMPLANT
DRAPE FLUOR MINI C-ARM 54X84 (DRAPES) ×2 IMPLANT
DURAPREP 26ML APPLICATOR (WOUND CARE) ×2 IMPLANT
ELECT REM PT RETURN 9FT ADLT (ELECTROSURGICAL) ×2
ELECTRODE REM PT RTRN 9FT ADLT (ELECTROSURGICAL) ×1 IMPLANT
GAUZE SPONGE 4X4 12PLY STRL (GAUZE/BANDAGES/DRESSINGS) ×2 IMPLANT
GAUZE XEROFORM 1X8 LF (GAUZE/BANDAGES/DRESSINGS) ×2 IMPLANT
GLOVE SURG ENC MOIS LTX SZ7.5 (GLOVE) ×2 IMPLANT
GLOVE SURG UNDER LTX SZ8 (GLOVE) ×2 IMPLANT
GOWN STRL REUS W/ TWL LRG LVL3 (GOWN DISPOSABLE) ×2 IMPLANT
GOWN STRL REUS W/TWL LRG LVL3 (GOWN DISPOSABLE) ×2
HANDPIECE VERSAJET DEBRIDEMENT (MISCELLANEOUS) ×2 IMPLANT
IV NS IRRIG 3000ML ARTHROMATIC (IV SOLUTION) ×2 IMPLANT
KIT STIMULAN RAPID CURE 5CC (Orthopedic Implant) ×1 IMPLANT
KIT TURNOVER KIT A (KITS) ×2 IMPLANT
LABEL OR SOLS (LABEL) ×2 IMPLANT
MANIFOLD NEPTUNE II (INSTRUMENTS) ×2 IMPLANT
NDL FILTER BLUNT 18X1 1/2 (NEEDLE) ×1 IMPLANT
NDL HYPO 25X1 1.5 SAFETY (NEEDLE) ×2 IMPLANT
NEEDLE FILTER BLUNT 18X 1/2SAF (NEEDLE) ×1
NEEDLE FILTER BLUNT 18X1 1/2 (NEEDLE) ×1 IMPLANT
NEEDLE HYPO 25X1 1.5 SAFETY (NEEDLE) ×4 IMPLANT
NS IRRIG 500ML POUR BTL (IV SOLUTION) ×2 IMPLANT
PACK EXTREMITY ARMC (MISCELLANEOUS) ×2 IMPLANT
PAD ABD DERMACEA PRESS 5X9 (GAUZE/BANDAGES/DRESSINGS) ×1 IMPLANT
SOL PREP PVP 2OZ (MISCELLANEOUS) ×2
SOLUTION PREP PVP 2OZ (MISCELLANEOUS) ×1 IMPLANT
SPONGE LAP 18X18 RF (DISPOSABLE) ×1 IMPLANT
STOCKINETTE BIAS CUT 4 980044 (GAUZE/BANDAGES/DRESSINGS) ×2 IMPLANT
STOCKINETTE STRL 6IN 960660 (GAUZE/BANDAGES/DRESSINGS) ×2 IMPLANT
STRIP CLOSURE SKIN 1/4X4 (GAUZE/BANDAGES/DRESSINGS) ×2 IMPLANT
SUT ETHILON 3-0 FS-10 30 BLK (SUTURE) ×2
SUT ETHILON 4-0 (SUTURE)
SUT ETHILON 4-0 FS2 18XMFL BLK (SUTURE)
SUTURE EHLN 3-0 FS-10 30 BLK (SUTURE) ×1 IMPLANT
SUTURE ETHLN 4-0 FS2 18XMF BLK (SUTURE) IMPLANT
SWAB CULTURE AMIES ANAERIB BLU (MISCELLANEOUS) ×2 IMPLANT
SYR 10ML LL (SYRINGE) ×2 IMPLANT

## 2020-05-29 NOTE — Anesthesia Postprocedure Evaluation (Signed)
Anesthesia Post Note  Patient: Scott Howard  Procedure(s) Performed: 4th and 5th TOE AMPUTATION WITH PARTIAL RAY RESECTION (Left Toe)  Patient location during evaluation: PACU Anesthesia Type: General Level of consciousness: awake and alert Pain management: pain level controlled Vital Signs Assessment: post-procedure vital signs reviewed and stable Respiratory status: spontaneous breathing and respiratory function stable Cardiovascular status: stable Anesthetic complications: no   No complications documented.   Last Vitals:  Vitals:   05/29/20 0947 05/29/20 1002  BP: (!) 104/53 124/61  Pulse: 73 81  Resp: 19 20  Temp: 36.8 C   SpO2: 99% 97%    Last Pain:  Vitals:   05/29/20 0947  TempSrc:   PainSc: Asleep                 Zakariyya Helfman K

## 2020-05-29 NOTE — Progress Notes (Signed)
Patient back to room 143 from OR s/p Amputation Left Fifth Toe with Partial Fifth Ray; Amputation Left Fourth Toe with Partial Fourth Ray; Excisional Debridement Ulceration Left Forefoot, full thickness.  Patient A+Ox4, VSS, Room Air.  No complaints of pain. Dressing to Left foot, c/d/i. Will continue to monitor and assess with plan of care.

## 2020-05-29 NOTE — Plan of Care (Signed)

## 2020-05-29 NOTE — Anesthesia Preprocedure Evaluation (Signed)
Anesthesia Evaluation  Patient identified by MRN, date of birth, ID band Patient awake    Reviewed: Allergy & Precautions, NPO status , Patient's Chart, lab work & pertinent test results  History of Anesthesia Complications Negative for: history of anesthetic complications  Airway Mallampati: II       Dental   Pulmonary neg sleep apnea (resolved after UPPP), neg COPD, Not current smoker,           Cardiovascular hypertension, Pt. on medications + Peripheral Vascular Disease  (-) Past MI and (-) CHF (-) dysrhythmias (-) Valvular Problems/Murmurs     Neuro/Psych neg Seizures    GI/Hepatic Neg liver ROS, neg GERD  ,  Endo/Other  diabetes, Type 2, Oral Hypoglycemic Agents  Renal/GU Renal InsufficiencyRenal disease     Musculoskeletal   Abdominal   Peds  Hematology   Anesthesia Other Findings   Reproductive/Obstetrics                             Anesthesia Physical Anesthesia Plan  ASA: III  Anesthesia Plan: General   Post-op Pain Management:    Induction: Intravenous  PONV Risk Score and Plan: 2  Airway Management Planned: LMA  Additional Equipment:   Intra-op Plan:   Post-operative Plan:   Informed Consent: I have reviewed the patients History and Physical, chart, labs and discussed the procedure including the risks, benefits and alternatives for the proposed anesthesia with the patient or authorized representative who has indicated his/her understanding and acceptance.       Plan Discussed with:   Anesthesia Plan Comments:         Anesthesia Quick Evaluation

## 2020-05-29 NOTE — Progress Notes (Signed)
Patient ID: Scott Howard, male   DOB: 1960/03/25, 60 y.o.   MRN: [id number] [facility] PROGRESS NOTE  Scott Marinererry R Maduro WUJ:[id number] DOB: 1960/03/25 DOA: 05/27/2020 PCP: Mick SellFitzgerald, David P, MD  HPI/Subjective: Patient seen this morning prior to surgery.  Physical he was feeling okay.  Nervous about procedure.  No other complaints.  Objective: Vitals:   05/29/20 1026 05/29/20 1042  BP: 122/66 (!) 142/69  Pulse: 80 76  Resp: 20 18  Temp:  97.9 F (36.6 C)  SpO2: 95% 98%    Intake/Output Summary (Last 24 hours) at 05/29/2020 1121 Last data filed at 05/29/2020 0935 Gross per 24 hour  Intake 1040 ml  Output 10 ml  Net 1030 ml   Filed Weights   05/27/20 1154 05/27/20 1506 05/28/20 0518  Weight: 70 kg 69.9 kg 70.9 kg    ROS: Review of Systems  Respiratory: Negative for shortness of breath.   Cardiovascular: Negative for chest pain.  Gastrointestinal: Negative for abdominal pain.   Exam: Physical Exam HENT:     Head: Normocephalic.     Mouth/Throat:     Pharynx: No oropharyngeal exudate.  Eyes:     General: Lids are normal.     Conjunctiva/sclera: Conjunctivae normal.     Pupils: Pupils are equal, round, and reactive to light.  Cardiovascular:     Rate and Rhythm: Normal rate and regular rhythm.     Heart sounds: Normal heart sounds, S1 normal and S2 normal.  Pulmonary:     Breath sounds: Normal breath sounds. No decreased breath sounds, wheezing, rhonchi or rales.  Abdominal:     Palpations: Abdomen is soft.     Tenderness: There is no abdominal tenderness.  Musculoskeletal:     Right lower leg: No swelling.     Left lower leg: No swelling.  Skin:    General: Skin is warm.     Comments: Bilateral feet covered.  Neurological:     Mental Status: He is alert and oriented to person, place, and time.       Data Reviewed: Basic Metabolic Panel: Recent Labs  Lab 05/27/20 1157 05/28/20 0434  NA 133* 136  K 4.5 4.5  CL 100 105  CO2 24 24  GLUCOSE 139*  143*  BUN 22* 25*  CREATININE 1.06 1.09  CALCIUM 9.2 8.7*  MG 1.6* 1.6*   Liver Function Tests: Recent Labs  Lab 05/27/20 1157  AST 10*  ALT 10  ALKPHOS 72  BILITOT 1.4*  PROT 8.0  ALBUMIN 3.4*   CBC: Recent Labs  Lab 05/27/20 1157 05/28/20 0434  WBC 7.4 7.1  NEUTROABS 5.1  --   HGB 12.7* 11.5*  HCT 36.9* 33.0*  MCV 84.2 85.1  PLT 268 236    CBG: Recent Labs  Lab 05/28/20 1154 05/28/20 1741 05/29/20 0003 05/29/20 0616 05/29/20 0950  GLUCAP 137* 112* 111* 115* 136*    Recent Results (from the past 240 hour(s))  Resp Panel by RT-PCR (Flu A&B, Covid) Nasopharyngeal Swab     Status: None   Collection Time: 05/27/20  1:44 PM   Specimen: Nasopharyngeal Swab; Nasopharyngeal(NP) swabs in vial transport medium  Result Value Ref Range Status   SARS Coronavirus 2 by RT PCR NEGATIVE NEGATIVE Final    Comment: (NOTE) SARS-CoV-2 target nucleic acids are NOT DETECTED.  The SARS-CoV-2 RNA is generally detectable in upper respiratory specimens during the acute phase of infection. The lowest concentration of SARS-CoV-2 viral copies this assay can detect is 138 copies/mL. A  negative result does not preclude SARS-Cov-2 infection and should not be used as the sole basis for treatment or other patient management decisions. A negative result may occur with  improper specimen collection/handling, submission of specimen other than nasopharyngeal swab, presence of viral mutation(s) within the areas targeted by this assay, and inadequate number of viral copies(<138 copies/mL). A negative result must be combined with clinical observations, patient history, and epidemiological information. The expected result is Negative.  Fact Sheet for Patients:  BloggerCourse.com  Fact Sheet for Healthcare Providers:  SeriousBroker.it  This test is no t yet approved or cleared by the [place] FDA and  has been authorized for detection  and/or diagnosis of SARS-CoV-2 by FDA under an Emergency Use Authorization (EUA). This EUA will remain  in effect (meaning this test can be used) for the duration of the COVID-19 declaration under Section 564(b)(1) of the Act, 21 U.S.C.section 360bbb-3(b)(1), unless the authorization is terminated  or revoked sooner.       Influenza A by PCR NEGATIVE NEGATIVE Final   Influenza B by PCR NEGATIVE NEGATIVE Final    Comment: (NOTE) The Xpert Xpress SARS-CoV-2/FLU/RSV plus assay is intended as an aid in the diagnosis of influenza from Nasopharyngeal swab specimens and should not be used as a sole basis for treatment. Nasal washings and aspirates are unacceptable for Xpert Xpress SARS-CoV-2/FLU/RSV testing.  Fact Sheet for Patients: BloggerCourse.com  Fact Sheet for Healthcare Providers: SeriousBroker.it  This test is not yet approved or cleared by the [place] FDA and has been authorized for detection and/or diagnosis of SARS-CoV-2 by FDA under an Emergency Use Authorization (EUA). This EUA will remain in effect (meaning this test can be used) for the duration of the COVID-19 declaration under Section 564(b)(1) of the Act, 21 U.S.C. section 360bbb-3(b)(1), unless the authorization is terminated or revoked.  Performed at [facility], [address] [place], [place] [address]   Culture, blood (Routine X 2) w Reflex to ID Panel     Status: None (Preliminary result)   Collection Time: 05/27/20  7:25 PM   Specimen: BLOOD  Result Value Ref Range Status   Specimen Description BLOOD BLOOD RIGHT HAND  Final   Special Requests   Final    BOTTLES DRAWN AEROBIC AND ANAEROBIC Blood Culture adequate volume   Culture   Final    NO GROWTH 2 DAYS Performed at [facility], [address], [place], [place] [address]    Report Status PENDING  Incomplete  Culture, blood (Routine X 2) w Reflex to ID Panel     Status:  None (Preliminary result)   Collection Time: 05/27/20  7:25 PM   Specimen: BLOOD  Result Value Ref Range Status   Specimen Description BLOOD RIGHT ANTECUBITAL  Final   Special Requests   Final    BOTTLES DRAWN AEROBIC AND ANAEROBIC Blood Culture adequate volume   Culture   Final    NO GROWTH 2 DAYS Performed at [facility], [address] [place], [place] [address]    Report Status PENDING  Incomplete     Studies: MR FOOT LEFT WO CONTRAST  Result Date: 05/28/2020 CLINICAL DATA:  60 year old male with significant history of diabetes with neuropathy who sustained an injury to both of his feet a couple of months ago while in [place] walking on a hot boardwalk. Possible osteomyelitis of the fourth and fifth toes on recent x-ray. EXAM: MRI OF THE LEFT FOOT WITHOUT CONTRAST TECHNIQUE: Multiplanar, multisequence MR imaging of  the left foot was performed. No intravenous contrast was administered. COMPARISON:  None. FINDINGS: Bones/Joint/Cartilage Severe bone marrow edema throughout the fifth metatarsal, fifth proximal phalanx and fifth distal phalanx most concerning for osteomyelitis. Fifth PIP joint effusion which may be reactive versus reflective of septic arthritis. Bone marrow edema in the fourth proximal phalanx, fourth middle phalanx and possibly fourth distal phalanx also concerning for osteomyelitis. Mild bone marrow edema in the shaft of the fourth metatarsal. Mild bone marrow edema at the base of the third metatarsal. Bone marrow edema in the first metatarsal head extending into the proximal shaft and at the base of the first proximal phalanx which may reflect stress reaction versus osteomyelitis. No acute fracture or dislocation. Normal alignment. No other joint effusion. Ligaments Collateral ligaments are intact.  Lisfranc ligament is intact. Muscles and Tendons Flexor, peroneal and extensor compartment tendons are intact. Generalized edema throughout the plantar musculature which may  be reactive versus secondary to myositis. Soft tissue No fluid collection or hematoma. No soft tissue mass. Generalized soft tissue edema along the dorsal aspect of the foot. IMPRESSION: 1. Severe bone marrow edema throughout the fifth metatarsal, fifth proximal phalanx and fifth distal phalanx most concerning for osteomyelitis. Fifth PIP joint effusion which may be reactive versus reflective of septic arthritis. 2. Bone marrow edema in the fourth proximal phalanx, fourth middle phalanx and possibly fourth distal phalanx also concerning for osteomyelitis versus stress reaction. 3. Mild bone marrow edema in the shaft of the fourth metatarsal. Mild bone marrow edema at the base of the third metatarsal. The changes may reflect mild stress reaction versus early osteomyelitis. 4. Bone marrow edema in the first metatarsal head extending into the proximal shaft and at the base of the first proximal phalanx which may reflect stress reaction versus osteomyelitis. Electronically Signed   By: Elige Ko   On: 05/28/2020 08:04   PERIPHERAL VASCULAR CATHETERIZATION  Result Date: 05/27/2020 See op note    Scheduled Meds: . collagenase   Topical Daily  . insulin aspart  0-5 Units Subcutaneous QHS  . insulin aspart  0-9 Units Subcutaneous TID WC  . lisinopril  10 mg Oral Daily  . pravastatin  20 mg Oral QHS  . sodium chloride flush  3 mL Intravenous Q12H   Continuous Infusions: . sodium chloride 75 mL/hr at 05/28/20 2031  . sodium chloride    . cefTRIAXone (ROCEPHIN)  IV 2 g (05/28/20 1611)  . vancomycin 1,500 mg (05/28/20 2100)    Assessment/Plan:  1. 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  Patient also has numerous ulcerations on the left foot.  Patient had amputation of the left fifth toe with partial fifth ray.  Patient had amputation of the left fourth toe with partial fourth ray and also excisional debridement and ulceration of the left forefoot full-thickness today  05/29/2020 by Dr. Alberteen Spindle podiatry. 2. Multiple ulcerations right foot, present on admission.  Will likely need an angiogram of the right lower extremity. 3. Peripheral vascular disease.  Dr. Gilda Crease did a angiogram of the left lower extremity with multiple angioplasties on 05/27/2020. 4. Type 2 diabetes mellitus with hyperlipidemia.  Continue pravastatin.  Will restart low-dose Glucotrol XL tomorrow.  Hemoglobin A1c better at 6.9.  On sliding scale.  Continue pravastatin. 5. Essential hypertension and proteinuria.  On lisinopril 6. Hypomagnesemia.  Replaced yesterday.       Code Status:     Code Status Orders  (From admission, onward)  Start     Ordered   05/27/20 1407  Full code  Continuous        05/27/20 1407        Code Status History    This patient has a current code status but no historical code status.   Advance Care Planning Activity     Disposition Plan: Status is: Inpatient  Dispo: The patient is from: Home              Anticipated d/c is to: Home              Patient currently on IV antibiotics for osteomyelitis   Difficult to place patient.  No.  Time spent: 27 minutes  Jansel [employer]

## 2020-05-29 NOTE — Interval H&P Note (Signed)
History and Physical Interval Note:  05/29/2020 8:29 AM  Scott Howard  has presented today for surgery, with the diagnosis of NECROSIS.  The various methods of treatment have been discussed with the patient and family. After consideration of risks, benefits and other options for treatment, the patient has consented to  Procedure(s): 4th and 5th TOE AMPUTATION WITH PARTIAL RAY RESECTION (Left) as a surgical intervention.  The patient's history has been reviewed, patient examined, no change in status, stable for surgery.  I have reviewed the patient's chart and labs.  Questions were answered to the patient's satisfaction.     [name]

## 2020-05-29 NOTE — Anesthesia Procedure Notes (Signed)
Procedure Name: LMA Insertion Date/Time: 05/29/2020 8:50 AM Performed by: Junious Silk, CRNA Pre-anesthesia Checklist: Patient identified, Patient being monitored, Timeout performed, Emergency Drugs available and Suction available Patient Re-evaluated:Patient Re-evaluated prior to induction Oxygen Delivery Method: Circle system utilized Preoxygenation: Pre-oxygenation with 100% oxygen Induction Type: IV induction Ventilation: Mask ventilation without difficulty LMA: LMA inserted LMA Size: 4.0 Tube type: Oral Number of attempts: 1 Placement Confirmation: positive ETCO2 and breath sounds checked- equal and bilateral Tube secured with: Tape Dental Injury: Teeth and Oropharynx as per pre-operative assessment

## 2020-05-29 NOTE — Transfer of Care (Signed)
Immediate Anesthesia Transfer of Care Note  Patient: Scott Howard  Procedure(s) Performed: 4th and 5th TOE AMPUTATION WITH PARTIAL RAY RESECTION (Left Toe)  Patient Location: PACU  Anesthesia Type:General  Level of Consciousness: sedated  Airway & Oxygen Therapy: Patient Spontanous Breathing and Patient connected to face mask oxygen  Post-op Assessment: Report given to RN and Post -op Vital signs reviewed and stable  Post vital signs: Reviewed and stable  Last Vitals:  Vitals Value Taken Time  BP 124/61 05/29/20 1000  Temp    Pulse 83 05/29/20 1000  Resp 19 05/29/20 1000  SpO2 96 % 05/29/20 1000  Vitals shown include unvalidated device data.  Last Pain:  Vitals:   05/29/20 0947  TempSrc:   PainSc: Asleep         Complications: No complications documented.
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
## 2020-05-29 NOTE — Plan of Care (Signed)

## 2020-05-29 NOTE — Consult Note (Signed)
ANTICOAGULATION CONSULT NOTE   Pharmacy Consult for Heparin Indication: Multiple tibial angioplasties left lower extremity  Patient Measurements: Heparin Dosing Weight: 69.9 kg  Labs: Recent Labs    05/27/20 1157 05/27/20 1925 05/28/20 0150 05/28/20 0434 05/28/20 1402 05/28/20 2142  HGB 12.7*  --   --  11.5*  --   --   HCT 36.9*  --   --  33.0*  --   --   PLT 268  --   --  236  --   --   LABPROT  --  13.5  --   --   --   --   INR  --  1.0  --   --   --   --   HEPARINUNFRC  --   --  <0.10*  --  0.11* 0.25*  CREATININE 1.06  --   --  1.09  --   --     Estimated Creatinine Clearance: 65 mL/min (by C-G formula based on SCr of 1.09 mg/dL).   Medical History: Past Medical History:  Diagnosis Date  . Diabetes mellitus without complication (HCC)   . Hyperlipidemia    borderline  . Hypertension     Assessment: Pharmacy consulted to start heparin for multiple tibial angioplasties left lower extremity. No bolus for the start, but can follow nomogram afterwards. No DOAC PTA.   4/30 at 0150 HL < 0.10; 950 units/hr 4/30 at 1402 HL 0.11; 1200 units/hr 4/30 at 2142 HL 0.25; 1400 units/hr  Patient is POD # 0 from amputation left fifth toe with partial fifth ray, amputation left fourth toe with partial fourth ray, and excisional debridement ulceration left forefoot with podiatry. Per vascular, plan is to continue heparin infusion as planning for angiogram on Tuesday/Wednesday.  Goal of Therapy:  Heparin level 0.3-0.7 units/ml Monitor platelets by anticoagulation protocol: Yes   Plan:  --Discussed with provider, will re-start IV heparin 8 hours after surgery at previous rate with no bolus --Re-start heparin at 1550 units/hr at 1800 this evening --Re-check HL 6 hours after re-initiation. Allow some wiggle room to achieve steady state as not giving bolus --Daily CBC per protocol while on IV heparin. Watch closely for bleeding given recent surgery  Tressie Ellis 05/29/2020,3:48  PM

## 2020-05-30 ENCOUNTER — Other Ambulatory Visit (INDEPENDENT_AMBULATORY_CARE_PROVIDER_SITE_OTHER): Payer: Self-pay | Admitting: Vascular Surgery

## 2020-05-30 ENCOUNTER — Inpatient Hospital Stay: Payer: Self-pay

## 2020-05-30 ENCOUNTER — Encounter: Payer: Self-pay | Admitting: Podiatry

## 2020-05-30 DIAGNOSIS — L97412 Non-pressure chronic ulcer of right heel and midfoot with fat layer exposed: Secondary | ICD-10-CM

## 2020-05-30 DIAGNOSIS — I739 Peripheral vascular disease, unspecified: Secondary | ICD-10-CM | POA: Diagnosis not present

## 2020-05-30 DIAGNOSIS — E1169 Type 2 diabetes mellitus with other specified complication: Secondary | ICD-10-CM | POA: Diagnosis not present

## 2020-05-30 DIAGNOSIS — E11621 Type 2 diabetes mellitus with foot ulcer: Secondary | ICD-10-CM | POA: Diagnosis not present

## 2020-05-30 DIAGNOSIS — M86172 Other acute osteomyelitis, left ankle and foot: Secondary | ICD-10-CM | POA: Diagnosis not present

## 2020-05-30 LAB — BASIC METABOLIC PANEL
Anion gap: 7 (ref 5–15)
BUN: 22 mg/dL — ABNORMAL HIGH (ref 6–20)
CO2: 23 mmol/L (ref 22–32)
Calcium: 8.5 mg/dL — ABNORMAL LOW (ref 8.9–10.3)
Chloride: 104 mmol/L (ref 98–111)
Creatinine, Ser: 0.8 mg/dL (ref 0.61–1.24)
GFR, Estimated: >60 mL/min (ref >60)
Glucose, Bld: 169 mg/dL — ABNORMAL HIGH (ref 70–99)
Potassium: 4.1 mmol/L (ref 3.5–5.1)
Sodium: 134 mmol/L — ABNORMAL LOW (ref 135–145)

## 2020-05-30 LAB — GLUCOSE, CAPILLARY
Glucose-Capillary: 109 mg/dL — ABNORMAL HIGH (ref 70–99)
Glucose-Capillary: 167 mg/dL — ABNORMAL HIGH (ref 70–99)
Glucose-Capillary: 167 mg/dL — ABNORMAL HIGH (ref 70–99)
Glucose-Capillary: 199 mg/dL — ABNORMAL HIGH (ref 70–99)
Glucose-Capillary: 119 mg/dL — ABNORMAL HIGH (ref 70–99)
Glucose-Capillary: 121 mg/dL — ABNORMAL HIGH (ref 70–99)

## 2020-05-30 LAB — CBC
HCT: 30.9 % — ABNORMAL LOW (ref 39.0–52.0)
Hemoglobin: 10.7 g/dL — ABNORMAL LOW (ref 13.0–17.0)
MCH: 29.2 pg (ref 26.0–34.0)
MCHC: 34.6 g/dL (ref 30.0–36.0)
MCV: 84.2 fL (ref 80.0–100.0)
Platelets: 206 10*3/uL (ref 150–400)
RBC: 3.67 MIL/uL — ABNORMAL LOW (ref 4.22–5.81)
RDW: 11.7 % (ref 11.5–15.5)
WBC: 9.7 10*3/uL (ref 4.0–10.5)
nRBC: 0 % (ref 0.0–0.2)

## 2020-05-30 LAB — HEPARIN LEVEL (UNFRACTIONATED)
Heparin Unfractionated: 0.18 IU/mL — ABNORMAL LOW (ref 0.30–0.70)
Heparin Unfractionated: 0.4 IU/mL (ref 0.30–0.70)
Heparin Unfractionated: 0.58 IU/mL (ref 0.30–0.70)
Heparin Unfractionated: 0.7 IU/mL (ref 0.30–0.70)

## 2020-05-30 LAB — MAGNESIUM: Magnesium: 1.7 mg/dL (ref 1.7–2.4)

## 2020-05-30 MED ORDER — LEVOFLOXACIN 500 MG PO TABS
750.0000 mg | ORAL_TABLET | Freq: Every day | ORAL | Status: DC
  Administered 2020-05-30 – 2020-06-01 (×2): 750 mg via ORAL
  Filled 2020-05-30 (×2): qty 2

## 2020-05-30 MED ORDER — HEPARIN BOLUS VIA INFUSION
2100.0000 [IU] | Freq: Once | INTRAVENOUS | Status: AC
  Administered 2020-05-30: 2100 [IU] via INTRAVENOUS
  Filled 2020-05-30: qty 2100

## 2020-05-30 MED ORDER — VANCOMYCIN HCL 1000 MG/200ML IV SOLN
1000.0000 mg | Freq: Two times a day (BID) | INTRAVENOUS | Status: DC
  Administered 2020-05-30 – 2020-05-31 (×2): 1000 mg via INTRAVENOUS
  Filled 2020-05-30 (×4): qty 200

## 2020-05-30 MED ORDER — MAGNESIUM OXIDE -MG SUPPLEMENT 400 (240 MG) MG PO TABS
400.0000 mg | ORAL_TABLET | Freq: Every day | ORAL | Status: DC
  Administered 2020-05-30 – 2020-06-01 (×3): 400 mg via ORAL
  Filled 2020-05-30 (×3): qty 1

## 2020-05-30 MED ORDER — GLIPIZIDE 5 MG PO TABS
5.0000 mg | ORAL_TABLET | Freq: Two times a day (BID) | ORAL | Status: DC
  Administered 2020-06-01: 5 mg via ORAL
  Filled 2020-05-30: qty 1

## 2020-05-30 NOTE — Progress Notes (Signed)
1 Day Post-Op   Subjective/Chief Complaint: Patient seen.  Doing well with no significant pain.   Objective: Vital signs in last 24 hours: Temp:  [97.3 F (36.3 C)-98.5 F (36.9 C)] 97.6 F (36.4 C) (05/02 0820) Pulse Rate:  [64-90] 72 (05/02 0820) Resp:  [17-22] 18 (05/02 0820) BP: (104-171)/(53-85) 157/85 (05/02 0820) SpO2:  [95 %-99 %] 97 % (05/02 0820) Last BM Date: 05/29/20  Intake/Output from previous day: 05/01 0701 - 05/02 0700 In: 1601.7 [P.O.:680; I.V.:821.7; IV Piggyback:100] Out: 10 [Blood:10] Intake/Output this shift: Total I/O In: 3 [I.V.:3] Out: -   Bandages are dry and intact.  Upon removal there is mild bleeding on the bandaging with no signs of purulence.  Incision is well coapted with skin edges viable.  Dorsal and plantar ulcerations are stable.  Minimal erythema with no sign of infection.    Lab Results:  Recent Labs    05/28/20 0434 05/30/20 0714  WBC 7.1 9.7  HGB 11.5* 10.7*  HCT 33.0* 30.9*  PLT 236 206   BMET Recent Labs    05/28/20 0434 05/30/20 0714  NA 136 134*  K 4.5 4.1  CL 105 104  CO2 24 23  GLUCOSE 143* 169*  BUN 25* 22*  CREATININE 1.09 0.80  CALCIUM 8.7* 8.5*   PT/INR Recent Labs    05/27/20 1925  LABPROT 13.5  INR 1.0   ABG No results for input(s): PHART, HCO3 in the last 72 hours.  Invalid input(s): PCO2, PO2  Studies/Results: No results found.  Anti-infectives: Anti-infectives (From admission, onward)   Start     Dose/Rate Route Frequency Ordered Stop   05/29/20 0859  vancomycin (VANCOCIN) powder  Status:  Discontinued          As needed 05/29/20 0859 05/29/20 0944   05/28/20 2000  vancomycin (VANCOREADY) IVPB 1500 mg/300 mL        1,500 mg 150 mL/hr over 120 Minutes Intravenous Every 24 hours 05/27/20 1456     05/28/20 0000  ceFAZolin (ANCEF) IVPB 2g/100 mL premix       Note to Pharmacy: To be given in specials   2 g 200 mL/hr over 30 Minutes Intravenous  Once 05/27/20 1556 05/27/20 1710    05/27/20 1800  vancomycin (VANCOREADY) IVPB 1750 mg/350 mL        1,750 mg 175 mL/hr over 120 Minutes Intravenous  Once 05/27/20 1539 05/27/20 2220   05/27/20 1600  vancomycin (VANCOREADY) IVPB 1750 mg/350 mL  Status:  Discontinued        1,750 mg 175 mL/hr over 120 Minutes Intravenous  Once 05/27/20 1456 05/27/20 1539   05/27/20 1500  cefTRIAXone (ROCEPHIN) 2 g in sodium chloride 0.9 % 100 mL IVPB        2 g 200 mL/hr over 30 Minutes Intravenous Every 24 hours 05/27/20 1442        Assessment/Plan: s/p Procedure(s): 4th and 5th TOE AMPUTATION WITH PARTIAL RAY RESECTION (Left) Assessment: Stable status post fourth and fifth ray resections left foot.   Plan: Vaseline gauze applied to the dorsal and plantar ulcerations with Betadine and a sterile bandage along the incision area.  Discussed with the patient that at this point I think he is scheduled for angiogram of his right lower extremity tomorrow.  Patient will also most likely need infectious disease consult with IV antibiotics.  Plan for dressing change on Wednesday.  LOS: 3 days    [name] 05/30/2020

## 2020-05-30 NOTE — Consult Note (Signed)
ANTICOAGULATION CONSULT NOTE   Pharmacy Consult for Heparin Indication: Multiple tibial angioplasties left lower extremity  Patient Measurements: Heparin Dosing Weight: 69.9 kg  Labs: Recent Labs    05/27/20 1925 05/28/20 0150 05/28/20 0434 05/28/20 1402 05/29/20 2338 05/30/20 0714 05/30/20 1351  HGB  --   --  11.5*  --   --  10.7*  --   HCT  --   --  33.0*  --   --  30.9*  --   PLT  --   --  236  --   --  206  --   LABPROT 13.5  --   --   --   --   --   --   INR 1.0  --   --   --   --   --   --   HEPARINUNFRC  --    < >  --    < > 0.18* 0.70 0.58  CREATININE  --   --  1.09  --   --  0.80  --    < > = values in this interval not displayed.    Estimated Creatinine Clearance: 88.6 mL/min (by C-G formula based on SCr of 0.8 mg/dL).   Medical History: Past Medical History:  Diagnosis Date  . Diabetes mellitus without complication (HCC)   . Hyperlipidemia    borderline  . Hypertension     Assessment: Pharmacy consulted to start heparin for multiple tibial angioplasties left lower extremity. No bolus for the start, but can follow nomogram afterwards. No DOAC PTA.  Date Time HL Rate/Comment 4/30 0150 <0.10 950 units/hr 4/30 1402 0.11;  1200 units/hr 4/30 2142 0.25;  1400 units/hr 5/1 2338 0.18:   bolus 2100,inc to 1800 units/hr.  5/2 0714 0.70  (upper limit of therapeutic) 1800 > 1750 un/hr 5/2 1351 0.58; thera x1; 1750 un/hr  Patient is POD # 0 from amputation left fifth toe with partial fifth ray, amputation left fourth toe with partial fourth ray, and excisional debridement ulceration left forefoot with podiatry. Per vascular, plan is to continue heparin infusion as planning for angiogram on Tuesday/Wednesday.  Goal of Therapy:  Heparin level 0.3-0.7 units/ml Monitor platelets by anticoagulation protocol: Yes   Plan:  5/2 1351 HL therapeutic x1 at 0.58 on 1750 un/hr.  Will continue at current rate and recheck HL in 6hrs. CTM daily CBC while on therapeutic  heparin   Martyn Malay 05/30/2020,2:42 PM

## 2020-05-30 NOTE — Evaluation (Signed)
Physical Therapy Evaluation Patient Details Name: Scott Howard MRN: [id number] DOB: 1960-11-28 Today's Date: 05/30/2020   History of Present Illness  Pt is a 60 y.o. male with medical history significant for type 2 diabetes mellitus complicated by peripheral polyneuropathy and bilateral foot ulcers, hypertension, hyperlipidemia, who is admitted to [facility] on 05/27/2020 with osteomyelitis of the left fifth toe.  Pt diagnosed with gangrene and osteomyelitis and is s/p fourth and fifth toe amputations and ray resections of the left foot.    Clinical Impression  Pt was pleasant and motivated to participate during the session and overall performed well.  Pt training provided on proper sequencing for PWB through the heels only using both a RW and crutches.  Pt was unsteady with the crutches with difficulty sequencing with them and with significantly increased sway/instability in standing.  With the RW the pt was steady with good control and needed only cuing for proper sequencing.  Pt education also provided in proper positioning of the LEs while at rest for elevation and to prevent heel pressure.  Please follow surgeon's recommendations for further therapies upon discharge.       Follow Up Recommendations Follow surgeon's recommendation for DC plan and follow-up therapies    Equipment Recommendations  Rolling walker with 5" wheels    Recommendations for Other Services       Precautions / Restrictions Precautions Precautions: None Restrictions Weight Bearing Restrictions: Yes RLE Weight Bearing: Partial weight bearing LLE Weight Bearing: Partial weight bearing Other Position/Activity Restrictions: PWB through bilateral heels only with RW and post-op shoes donned, limited distances only      Mobility  Bed Mobility Overal bed mobility: Independent                  Transfers Overall transfer level: Needs assistance Equipment used: Rolling walker (2  wheeled) Transfers: Sit to/from Stand Sit to Stand: Supervision         General transfer comment: Min verbal cues for hand placement  Ambulation/Gait Ambulation/Gait assistance: Supervision Gait Distance (Feet): 20 Feet Assistive device: Rolling walker (2 wheeled);Crutches Gait Pattern/deviations: Step-to pattern;Decreased step length - right;Decreased step length - left Gait velocity: decreased   General Gait Details: Mod verbal and visual cues for safe sequencing with the RW and crutches with pt significantly more steady with the RW than with the crutches  Stairs            Wheelchair Mobility    Modified Rankin (Stroke Patients Only)       Balance Overall balance assessment: Needs assistance   Sitting balance-Leahy Scale: Normal     Standing balance support: Bilateral upper extremity supported;During functional activity Standing balance-Leahy Scale: Good                               Pertinent Vitals/Pain Pain Assessment: 0-10 Pain Score: 2  Pain Location: L foot Pain Descriptors / Indicators: Sore Pain Intervention(s): Premedicated before session;Monitored during session;Repositioned    Home Living Family/patient expects to be discharged to:: Private residence Living Arrangements: Alone Available Help at Discharge: Family;Available 24 hours/day Type of Home: House Home Access: Stairs to enter Entrance Stairs-Rails: Right Entrance Stairs-Number of Steps: 3 Home Layout: One level Home Equipment: Grab bars - tub/shower;Grab bars - toilet      Prior Function Level of Independence: Independent         Comments: Ind amb community distances without an AD, Ind with  ADLs     Hand Dominance        Extremity/Trunk Assessment   Upper Extremity Assessment Upper Extremity Assessment: Overall WFL for tasks assessed    Lower Extremity Assessment Lower Extremity Assessment: Overall WFL for tasks assessed       Communication    Communication: No difficulties  Cognition Arousal/Alertness: Awake/alert Behavior During Therapy: WFL for tasks assessed/performed Overall Cognitive Status: Within Functional Limits for tasks assessed                                        General Comments      Exercises Other Exercises Other Exercises: Sit to/from stand transfer training from various surfaces with crutches and with RW with cues for proper sequencing   Assessment/Plan    PT Assessment Patient needs continued PT services  PT Problem List Decreased activity tolerance;Decreased knowledge of use of DME;Pain;Decreased knowledge of precautions       PT Treatment Interventions DME instruction;Gait training;Stair training;Functional mobility training;Therapeutic activities;Therapeutic exercise;Balance training;Patient/family education    PT Goals (Current goals can be found in the Care Plan section)  Acute Rehab PT Goals Patient Stated Goal: To be able to play tennis again PT Goal Formulation: With patient Time For Goal Achievement: 06/12/20 Potential to Achieve Goals: Fair    Frequency 7X/week   Barriers to discharge        Co-evaluation               AM-PAC PT "6 Clicks" Mobility  Outcome Measure Help needed turning from your back to your side while in a flat bed without using bedrails?: None Help needed moving from lying on your back to sitting on the side of a flat bed without using bedrails?: None Help needed moving to and from a bed to a chair (including a wheelchair)?: A Little Help needed standing up from a chair using your arms (e.g., wheelchair or bedside chair)?: A Little Help needed to walk in hospital room?: A Little Help needed climbing 3-5 steps with a railing? : A Little 6 Click Score: 20    End of Session   Activity Tolerance: Patient tolerated treatment well Patient left: in bed;with nursing/sitter in room;with call bell/phone within reach Nurse Communication:  Mobility status;Weight bearing status PT Visit Diagnosis: Difficulty in walking, not elsewhere classified (R26.2);Pain Pain - Right/Left: Left Pain - part of body: Ankle and joints of foot    Time: 1350-1432 PT Time Calculation (min) (ACUTE ONLY): 42 min   Charges:   PT Evaluation $PT Eval Moderate Complexity: 1 Mod PT Treatments $Gait Training: 8-22 mins        D. Elly Modena PT, DPT 05/30/20, 2:55 PM

## 2020-05-30 NOTE — Consult Note (Signed)
ANTICOAGULATION CONSULT NOTE   Pharmacy Consult for Heparin Indication: Multiple tibial angioplasties left lower extremity  Patient Measurements: Heparin Dosing Weight: 69.9 kg  Labs: Recent Labs    05/27/20 1157 05/27/20 1925 05/28/20 0150 05/28/20 0434 05/28/20 1402 05/28/20 2142 05/29/20 2338  HGB 12.7*  --   --  11.5*  --   --   --   HCT 36.9*  --   --  33.0*  --   --   --   PLT 268  --   --  236  --   --   --   LABPROT  --  13.5  --   --   --   --   --   INR  --  1.0  --   --   --   --   --   HEPARINUNFRC  --   --    < >  --  0.11* 0.25* 0.18*  CREATININE 1.06  --   --  1.09  --   --   --    < > = values in this interval not displayed.    Estimated Creatinine Clearance: 65 mL/min (by C-G formula based on SCr of 1.09 mg/dL).   Medical History: Past Medical History:  Diagnosis Date  . Diabetes mellitus without complication (HCC)   . Hyperlipidemia    borderline  . Hypertension     Assessment: Pharmacy consulted to start heparin for multiple tibial angioplasties left lower extremity. No bolus for the start, but can follow nomogram afterwards. No DOAC PTA.   4/30 at 0150 HL < 0.10; 950 units/hr 4/30 at 1402 HL 0.11; 1200 units/hr 4/30 at 2142 HL 0.25; 1400 units/hr  Patient is POD # 0 from amputation left fifth toe with partial fifth ray, amputation left fourth toe with partial fourth ray, and excisional debridement ulceration left forefoot with podiatry. Per vascular, plan is to continue heparin infusion as planning for angiogram on Tuesday/Wednesday.  Goal of Therapy:  Heparin level 0.3-0.7 units/ml Monitor platelets by anticoagulation protocol: Yes   Plan:  5/1 @ 2338:   HL = 0.18 Will order heparin 2100 units IV X 1 and increase drip rate to 1800 units/hr.  Will recheck HL 6 hrs after rate change.   Jesseca Marsch D 05/30/2020,12:37 AM

## 2020-05-30 NOTE — Consult Note (Signed)
ANTICOAGULATION CONSULT NOTE   Pharmacy Consult for Heparin Indication: Multiple tibial angioplasties left lower extremity  Patient Measurements: Heparin Dosing Weight: 69.9 kg  Labs: Recent Labs    05/27/20 1157 05/27/20 1925 05/28/20 0150 05/28/20 0434 05/28/20 1402 05/28/20 2142 05/29/20 2338 05/30/20 0714  HGB 12.7*  --   --  11.5*  --   --   --  10.7*  HCT 36.9*  --   --  33.0*  --   --   --  30.9*  PLT 268  --   --  236  --   --   --  206  LABPROT  --  13.5  --   --   --   --   --   --   INR  --  1.0  --   --   --   --   --   --   HEPARINUNFRC  --   --    < >  --    < > 0.25* 0.18* 0.70  CREATININE 1.06  --   --  1.09  --   --   --  0.80   < > = values in this interval not displayed.    Estimated Creatinine Clearance: 88.6 mL/min (by C-G formula based on SCr of 0.8 mg/dL).   Medical History: Past Medical History:  Diagnosis Date  . Diabetes mellitus without complication (HCC)   . Hyperlipidemia    borderline  . Hypertension     Assessment: Pharmacy consulted to start heparin for multiple tibial angioplasties left lower extremity. No bolus for the start, but can follow nomogram afterwards. No DOAC PTA.   4/30 at 0150 HL < 0.10; 950 units/hr 4/30 at 1402 HL 0.11; 1200 units/hr 4/30 at 2142 HL 0.25; 1400 units/hr 5/1   at 2338 HL 0.18:  bolus 2100,inc to 1800 units/hr.   Patient is POD # 0 from amputation left fifth toe with partial fifth ray, amputation left fourth toe with partial fourth ray, and excisional debridement ulceration left forefoot with podiatry. Per vascular, plan is to continue heparin infusion as planning for angiogram on Tuesday/Wednesday.  Goal of Therapy:  Heparin level 0.3-0.7 units/ml Monitor platelets by anticoagulation protocol: Yes   Plan:  5/2 @ 0714:   HL = 0.70 (upper limit of therapeutic) Will slightly decrease drip rate to 1750 units/hr.  Will recheck HL 6 hrs after rate change.   Tilly Pernice A 05/30/2020,7:44 AM

## 2020-05-30 NOTE — Consult Note (Signed)
Pharmacy Antibiotic Note  Scott Howard is a 60 y.o. male admitted on 05/27/2020 with Osteomyelitis.  Pharmacy has been consulted for vancomycin dosing.  5/2 POD#1 s/p amputation R toes. For R angiogram tomorrow  Plan: Scr improved. Will adjust vancomycin from 1500 mg q24H to 1000mg  IV q12h for a predicted AUC of 502. Goal AUC 400-550.  Scr 0.80.  Cmin 13.7 F/u to deteremine if Vancomycin to be continued and assessment of timing of levels.  Continue to monitor renal function.   Ceftriaxone 2 g daily.   Height: 5\' 6"  (167.6 cm) Weight: 70.9 kg (156 lb 4.9 oz) IBW/kg (Calculated) : 63.8  Temp (24hrs), Avg:97.9 F (36.6 C), Min:97.6 F (36.4 C), Max:98.5 F (36.9 C)  Recent Labs  Lab 05/27/20 1157 05/28/20 0434 05/30/20 0714  WBC 7.4 7.1 9.7  CREATININE 1.06 1.09 0.80    Estimated Creatinine Clearance: 88.6 mL/min (by C-G formula based on SCr of 0.8 mg/dL).    Allergies  Allergen Reactions  . Sulfa Antibiotics Rash  . Silvadene [Silver Sulfadiazine]     Antimicrobials this admission: 4/29  ceftriaxone >>  4/29 vancomycin >>   Dose adjustments this admission: 5/2: 1500 mg q24H to 1000mg  IV q12h   Microbiology results: 5/1 bone cx: pending Thank you for allowing pharmacy to be a part of this patient's care.  Lima Chillemi A, PharmD 05/30/2020 1:01 PM

## 2020-05-30 NOTE — Plan of Care (Signed)

## 2020-05-30 NOTE — Progress Notes (Signed)
Inpatient Diabetes Program Recommendations  AACE/ADA: New Consensus Statement on Inpatient Glycemic Control (2015)  Target Ranges:  Prepandial:   less than 140 mg/dL      Peak postprandial:   less than 180 mg/dL (1-2 hours)      Critically ill patients:  140 - 180 mg/dL   Lab Results  Component Value Date   GLUCAP 167 (H) 05/30/2020   HGBA1C 6.7 (H) 05/27/2020    Review of Glycemic Control  Results for NORWIN, ALEMAN (MRN [id number]) as of 05/30/2020 10:37  Ref. Range 05/29/2020 09:50 05/29/2020 11:52 05/29/2020 17:21 05/29/2020 21:14 05/29/2020 23:57 05/30/2020 06:13 05/30/2020 08:05  Glucose-Capillary Latest Ref Range: 70 - 99 mg/dL 768 (H) 115 (H) 726 (H) 270 (H) 212 (H) 167 (H) 167 (H)   Diabetes history: DM 2 Outpatient Diabetes medications:  Glucotrol 5 mg bid Current orders for Inpatient glycemic control:  Novolog sensitive tid with meals and HS Glucotrol 2.5 mg daily Inpatient Diabetes Program Recommendations:    Note CBG's increased after Decadron 5 mg on 05/29/20.  Fasting blood sugars improved today and A1C indicates great glycemic control prior to admit.  Will follow.   Thanks  Beryl Meager, RN, BC-ADM Inpatient Diabetes Coordinator Pager [phone] (8a-5p)

## 2020-05-30 NOTE — Progress Notes (Signed)
Patient ID: Scott Howard, male   DOB: [birth year], 60 y.o.   MRN: [id number] [facility] PROGRESS NOTE  Scott Howard IOX:[id number] DOB: [birth year] DOA: 05/27/2020 PCP: Mick Sell, MD  HPI/Subjective: Patient feeling okay.  Really does not have that much pain with his foot.  Had amputation yesterday for osteomyelitis and gangrene.  Objective: Vitals:   05/30/20 0632 05/30/20 0820  BP: (!) 169/76 (!) 157/85  Pulse: 64 72  Resp:  18  Temp:  97.6 F (36.4 C)  SpO2:  97%    Intake/Output Summary (Last 24 hours) at 05/30/2020 1559 Last data filed at 05/30/2020 1406 Gross per 24 hour  Intake 883.21 ml  Output --  Net 883.21 ml   Filed Weights   05/27/20 1154 05/27/20 1506 05/28/20 0518  Weight: 70 kg 69.9 kg 70.9 kg    ROS: Review of Systems  Respiratory: Negative for shortness of breath.   Cardiovascular: Negative for chest pain.  Gastrointestinal: Negative for abdominal pain, nausea and vomiting.  Musculoskeletal: Positive for joint pain.   Exam: Physical Exam HENT:     Head: Normocephalic.     Mouth/Throat:     Pharynx: No oropharyngeal exudate.  Eyes:     General: Lids are normal.     Conjunctiva/sclera: Conjunctivae normal.     Pupils: Pupils are equal, round, and reactive to light.  Cardiovascular:     Rate and Rhythm: Normal rate and regular rhythm.     Heart sounds: Normal heart sounds, S1 normal and S2 normal.  Pulmonary:     Breath sounds: No decreased breath sounds, wheezing, rhonchi or rales.  Abdominal:     Palpations: Abdomen is soft.     Tenderness: There is no abdominal tenderness.  Musculoskeletal:     Right ankle: No swelling.     Left ankle: No swelling.  Skin:    General: Skin is warm.  Neurological:     Mental Status: He is alert and oriented to person, place, and time.         Data Reviewed: Basic Metabolic Panel: Recent Labs  Lab 05/27/20 1157 05/28/20 0434 05/30/20 0714  NA 133* 136 134*  K 4.5 4.5 4.1  CL 100  105 104  CO2 24 24 23   GLUCOSE 139* 143* 169*  BUN 22* 25* 22*  CREATININE 1.06 1.09 0.80  CALCIUM 9.2 8.7* 8.5*  MG 1.6* 1.6* 1.7   Liver Function Tests: Recent Labs  Lab 05/27/20 1157  AST 10*  ALT 10  ALKPHOS 72  BILITOT 1.4*  PROT 8.0  ALBUMIN 3.4*   CBC: Recent Labs  Lab 05/27/20 1157 05/28/20 0434 05/30/20 0714  WBC 7.4 7.1 9.7  NEUTROABS 5.1  --   --   HGB 12.7* 11.5* 10.7*  HCT 36.9* 33.0* 30.9*  MCV 84.2 85.1 84.2  PLT 268 236 206    CBG: Recent Labs  Lab 05/29/20 2114 05/29/20 2357 05/30/20 0613 05/30/20 0805 05/30/20 1114  GLUCAP 270* 212* 167* 167* 199*    Recent Results (from the past 240 hour(s))  Resp Panel by RT-PCR (Flu A&B, Covid) Nasopharyngeal Swab     Status: None   Collection Time: 05/27/20  1:44 PM   Specimen: Nasopharyngeal Swab; Nasopharyngeal(NP) swabs in vial transport medium  Result Value Ref Range Status   SARS Coronavirus 2 by RT PCR NEGATIVE NEGATIVE Final    Comment: (NOTE) SARS-CoV-2 target nucleic acids are NOT DETECTED.  The SARS-CoV-2 RNA is generally detectable in upper respiratory specimens during  the acute phase of infection. The lowest concentration of SARS-CoV-2 viral copies this assay can detect is 138 copies/mL. A negative result does not preclude SARS-Cov-2 infection and should not be used as the sole basis for treatment or other patient management decisions. A negative result may occur with  improper specimen collection/handling, submission of specimen other than nasopharyngeal swab, presence of viral mutation(s) within the areas targeted by this assay, and inadequate number of viral copies(<138 copies/mL). A negative result must be combined with clinical observations, patient history, and epidemiological information. The expected result is Negative.  Fact Sheet for Patients:  BloggerCourse.com  Fact Sheet for Healthcare Providers:   SeriousBroker.it  This test is no t yet approved or cleared by the [place] FDA and  has been authorized for detection and/or diagnosis of SARS-CoV-2 by FDA under an Emergency Use Authorization (EUA). This EUA will remain  in effect (meaning this test can be used) for the duration of the COVID-19 declaration under Section 564(b)(1) of the Act, 21 U.S.C.section 360bbb-3(b)(1), unless the authorization is terminated  or revoked sooner.       Influenza A by PCR NEGATIVE NEGATIVE Final   Influenza B by PCR NEGATIVE NEGATIVE Final    Comment: (NOTE) The Xpert Xpress SARS-CoV-2/FLU/RSV plus assay is intended as an aid in the diagnosis of influenza from Nasopharyngeal swab specimens and should not be used as a sole basis for treatment. Nasal washings and aspirates are unacceptable for Xpert Xpress SARS-CoV-2/FLU/RSV testing.  Fact Sheet for Patients: BloggerCourse.com  Fact Sheet for Healthcare Providers: SeriousBroker.it  This test is not yet approved or cleared by the [place] FDA and has been authorized for detection and/or diagnosis of SARS-CoV-2 by FDA under an Emergency Use Authorization (EUA). This EUA will remain in effect (meaning this test can be used) for the duration of the COVID-19 declaration under Section 564(b)(1) of the Act, 21 U.S.C. section 360bbb-3(b)(1), unless the authorization is terminated or revoked.  Performed at [facility], [address] [place], [place] [address]   Culture, blood (Routine X 2) w Reflex to ID Panel     Status: None (Preliminary result)   Collection Time: 05/27/20  7:25 PM   Specimen: BLOOD  Result Value Ref Range Status   Specimen Description BLOOD BLOOD RIGHT HAND  Final   Special Requests   Final    BOTTLES DRAWN AEROBIC AND ANAEROBIC Blood Culture adequate volume   Culture   Final    NO GROWTH 3 DAYS Performed at [facility], [address], [place], [place] [address]    Report Status PENDING  Incomplete  Culture, blood (Routine X 2) w Reflex to ID Panel     Status: None (Preliminary result)   Collection Time: 05/27/20  7:25 PM   Specimen: BLOOD  Result Value Ref Range Status   Specimen Description BLOOD RIGHT ANTECUBITAL  Final   Special Requests   Final    BOTTLES DRAWN AEROBIC AND ANAEROBIC Blood Culture adequate volume   Culture   Final    NO GROWTH 3 DAYS Performed at [facility], [address], [place], [place] [address]    Report Status PENDING  Incomplete  Aerobic/Anaerobic Culture w Gram Stain (surgical/deep wound)     Status: None (Preliminary result)   Collection Time: 05/29/20  9:09 AM   Specimen: PATH Amputaion Arm/Leg; Tissue  Result Value Ref Range Status   Specimen Description   Final    BONE LEFT FOOT BONE Performed at  [facility], [address], [place], [place] [address]    Special Requests   Final    NONE Performed at [facility], [address] [place], [place] [address]    Gram Stain NO WBC SEEN NO ORGANISMS SEEN   Final   Culture   Final    NO GROWTH < 24 HOURS Performed at [facility], [address], [place], [place] [address]    Report Status PENDING  Incomplete     Studies: [place] EKG SITE RITE  Result Date: 05/30/2020 If Site Rite image not attached, placement could not be confirmed due to current cardiac rhythm.   Scheduled Meds: . collagenase   Topical Daily  . glipiZIDE  2.5 mg Oral Q breakfast  . insulin aspart  0-5 Units Subcutaneous QHS  . insulin aspart  0-9 Units Subcutaneous TID WC  . lisinopril  10 mg Oral Daily  . magnesium oxide  400 mg Oral Daily  . pravastatin  20 mg Oral QHS  . sodium chloride flush  3 mL Intravenous Q12H   Continuous Infusions: . sodium chloride 75 mL/hr at 05/30/20 0312  . sodium chloride    . cefTRIAXone (ROCEPHIN)  IV 2 g (05/30/20 1533)  . heparin 1,750 Units/hr (05/30/20  1435)  . vancomycin 1,000 mg (05/30/20 1359)    Assessment/Plan:  1. 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  Status post left fourth and fifth toe amputation by Dr. Alberteen Spindle on 05/29/2020.  Consulted infectious disease.  PICC line ordered.  Likely will go home with IV antibiotics.  Transitional care team will work on home health. 2. Multiple ulcerations right foot, present on admission.  Will need angiogram of the right lower extremity.  That is set up for tomorrow 05/31/2020 3. Peripheral vascular disease.  Dr. Gilda Crease did an angiogram of the left lower extremity with multiple angioplasties on 05/27/2020.  We will have an angiogram tomorrow of the right lower extremity. 4. Type 2 diabetes mellitus with hyperlipidemia.  Continue pravastatin.  Started Glucotrol XL back today but will hold for tomorrow since will be n.p.o. for procedure.  Continue sliding scale insulin. 5. Essential hypertension on lisinopril.  Blood pressure little bit elevated today.  Continue to watch today if still elevated tomorrow will increase lisinopril dose. 6. Hypomagnesemia.  Will start oral replacement.  Replaced IV during the hospital course.       Code Status:     Code Status Orders  (From admission, onward)         Start     Ordered   05/27/20 1407  Full code  Continuous        05/27/20 1407        Code Status History    This patient has a current code status but no historical code status.   Advance Care Planning Activity     Disposition Plan: Status is: Inpatient  Dispo: The patient is from: Home              Anticipated d/c is to: Home with home health.  Earliest potential will be 06/01/2020.              Patient currently on IV antibiotics for osteomyelitis.  We will have an angiogram tomorrow.   Difficult to place patient.  No.  Time spent: 27 minutes  Scott Howard [employer]

## 2020-05-30 NOTE — Progress Notes (Signed)
Secure chat with RN re PICC placement.  Waiting on ID consultation for approval to place PICC. Pt not for d/c home today.  Will follow progress notes.

## 2020-05-30 NOTE — Consult Note (Signed)
ANTICOAGULATION CONSULT NOTE   Pharmacy Consult for Heparin Indication: Multiple tibial angioplasties left lower extremity  Patient Measurements: Heparin Dosing Weight: 69.9 kg  Labs: Recent Labs    05/28/20 0434 05/28/20 1402 05/30/20 0714 05/30/20 1351 05/30/20 1953  HGB 11.5*  --  10.7*  --   --   HCT 33.0*  --  30.9*  --   --   PLT 236  --  206  --   --   HEPARINUNFRC  --    < > 0.70 0.58 0.40  CREATININE 1.09  --  0.80  --   --    < > = values in this interval not displayed.    Estimated Creatinine Clearance: 88.6 mL/min (by C-G formula based on SCr of 0.8 mg/dL).   Medical History: Past Medical History:  Diagnosis Date  . Diabetes mellitus without complication (HCC)   . Hyperlipidemia    borderline  . Hypertension     Assessment: Pharmacy consulted to start heparin for multiple tibial angioplasties left lower extremity. No bolus for the start, but can follow nomogram afterwards. No DOAC PTA.  Date Time HL Rate/Comment 4/30 0150 <0.10 950 units/hr 4/30 1402 0.11;  1200 units/hr 4/30 2142 0.25;  1400 units/hr 5/1 2338 0.18:   bolus 2100,inc to 1800 units/hr.  5/2 0714 0.70  (upper limit of therapeutic) 1800 > 1750 un/hr 5/2 1351 0.58; thera x1; 1750 un/hr 5/2 1953 0.40 Therapeutic x 2 @ 1750 unit/hr   Patient is POD # 0 from amputation left fifth toe with partial fifth ray, amputation left fourth toe with partial fourth ray, and excisional debridement ulceration left forefoot with podiatry. Per vascular, plan is to continue heparin infusion as planning for angiogram on Tuesday/Wednesday.  Goal of Therapy:  Heparin level 0.3-0.7 units/ml Monitor platelets by anticoagulation protocol: Yes   Plan:   HL remains therapeutic on  1750 un/hr.  Will continue at current rate and recheck HL with AM labs . CTM daily CBC while on therapeutic heparin   Sharen Hones, PharmD, BCPS Clinical Pharmacist  05/30/2020,9:13 PM

## 2020-05-30 NOTE — Consult Note (Signed)
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 recs based on culture results but will likely do vanco or dapto + oral quinolone.  Thank you very much for allowing me to participate in the care of this patient. Please call with questions.   Cheral Marker. Ola Spurr, MD

## 2020-05-31 ENCOUNTER — Inpatient Hospital Stay: Payer: 59

## 2020-05-31 ENCOUNTER — Encounter
Admission: EM | Disposition: A | Discharge status: Home / Self Care | Payer: Self-pay | Source: Home / Self Care | Attending: Internal Medicine

## 2020-05-31 DIAGNOSIS — M86172 Other acute osteomyelitis, left ankle and foot: Secondary | ICD-10-CM | POA: Diagnosis not present

## 2020-05-31 DIAGNOSIS — I70235 Atherosclerosis of native arteries of right leg with ulceration of other part of foot: Secondary | ICD-10-CM

## 2020-05-31 DIAGNOSIS — I739 Peripheral vascular disease, unspecified: Secondary | ICD-10-CM | POA: Diagnosis not present

## 2020-05-31 DIAGNOSIS — E11621 Type 2 diabetes mellitus with foot ulcer: Secondary | ICD-10-CM | POA: Diagnosis not present

## 2020-05-31 DIAGNOSIS — E1142 Type 2 diabetes mellitus with diabetic polyneuropathy: Secondary | ICD-10-CM

## 2020-05-31 HISTORY — PX: LOWER EXTREMITY ANGIOGRAPHY: CATH118251

## 2020-05-31 LAB — CBC
HCT: 34.6 % — ABNORMAL LOW (ref 39.0–52.0)
Hemoglobin: 12.2 g/dL — ABNORMAL LOW (ref 13.0–17.0)
MCH: 29.7 pg (ref 26.0–34.0)
MCHC: 35.3 g/dL (ref 30.0–36.0)
MCV: 84.2 fL (ref 80.0–100.0)
Platelets: 219 10*3/uL (ref 150–400)
RBC: 4.11 MIL/uL — ABNORMAL LOW (ref 4.22–5.81)
RDW: 11.9 % (ref 11.5–15.5)
WBC: 6.1 10*3/uL (ref 4.0–10.5)
nRBC: 0 % (ref 0.0–0.2)

## 2020-05-31 LAB — BASIC METABOLIC PANEL
Anion gap: 9 (ref 5–15)
BUN: 16 mg/dL (ref 6–20)
CO2: 25 mmol/L (ref 22–32)
Calcium: 9 mg/dL (ref 8.9–10.3)
Chloride: 102 mmol/L (ref 98–111)
Creatinine, Ser: 0.96 mg/dL (ref 0.61–1.24)
GFR, Estimated: >60 mL/min (ref >60)
Glucose, Bld: 121 mg/dL — ABNORMAL HIGH (ref 70–99)
Potassium: 4.3 mmol/L (ref 3.5–5.1)
Sodium: 136 mmol/L (ref 135–145)

## 2020-05-31 LAB — CK: Total CK: 32 U/L — ABNORMAL LOW (ref 49–397)

## 2020-05-31 LAB — GLUCOSE, CAPILLARY
Glucose-Capillary: 115 mg/dL — ABNORMAL HIGH (ref 70–99)
Glucose-Capillary: 133 mg/dL — ABNORMAL HIGH (ref 70–99)
Glucose-Capillary: 118 mg/dL — ABNORMAL HIGH (ref 70–99)
Glucose-Capillary: 118 mg/dL — ABNORMAL HIGH (ref 70–99)
Glucose-Capillary: 167 mg/dL — ABNORMAL HIGH (ref 70–99)

## 2020-05-31 LAB — HEPARIN LEVEL (UNFRACTIONATED): Heparin Unfractionated: 0.58 IU/mL (ref 0.30–0.70)

## 2020-05-31 SURGERY — LOWER EXTREMITY ANGIOGRAPHY
Anesthesia: Moderate Sedation | Laterality: Right

## 2020-05-31 MED ORDER — VANCOMYCIN HCL 1750 MG/350ML IV SOLN
1750.0000 mg | INTRAVENOUS | Status: DC
  Filled 2020-05-31: qty 350

## 2020-05-31 MED ORDER — MIDAZOLAM HCL 2 MG/2ML IJ SOLN
INTRAMUSCULAR | Status: DC | PRN
  Administered 2020-05-31: 1 mg via INTRAVENOUS
  Administered 2020-05-31: 2 mg via INTRAVENOUS
  Administered 2020-05-31: 1 mg via INTRAVENOUS

## 2020-05-31 MED ORDER — SODIUM CHLORIDE 0.9 % IV SOLN: INTRAVENOUS | Status: AC

## 2020-05-31 MED ORDER — FENTANYL CITRATE (PF) 100 MCG/2ML IJ SOLN
INTRAMUSCULAR | Status: AC
  Filled 2020-05-31: qty 2

## 2020-05-31 MED ORDER — MIDAZOLAM HCL 2 MG/ML PO SYRP: 8.0000 mg | ORAL_SOLUTION | Freq: Once | ORAL | Status: DC | PRN

## 2020-05-31 MED ORDER — HEPARIN (PORCINE) 25000 UT/250ML-% IV SOLN
1750.0000 [IU]/h | INTRAVENOUS | Status: DC
  Administered 2020-05-31 – 2020-06-01 (×2): 1750 [IU]/h via INTRAVENOUS
  Filled 2020-05-31 (×3): qty 250

## 2020-05-31 MED ORDER — ONDANSETRON HCL 4 MG/2ML IJ SOLN: 4.0000 mg | Freq: Four times a day (QID) | INTRAMUSCULAR | Status: DC | PRN

## 2020-05-31 MED ORDER — FENTANYL CITRATE (PF) 100 MCG/2ML IJ SOLN
INTRAMUSCULAR | Status: DC | PRN
  Administered 2020-05-31: 50 ug via INTRAVENOUS
  Administered 2020-05-31 (×2): 25 ug via INTRAVENOUS

## 2020-05-31 MED ORDER — SODIUM CHLORIDE 0.9 % IV SOLN: INTRAVENOUS | Status: DC

## 2020-05-31 MED ORDER — METHYLPREDNISOLONE SODIUM SUCC 125 MG IJ SOLR: 125.0000 mg | Freq: Once | INTRAMUSCULAR | Status: DC | PRN

## 2020-05-31 MED ORDER — SODIUM CHLORIDE 0.9% FLUSH: 3.0000 mL | INTRAVENOUS | Status: DC | PRN

## 2020-05-31 MED ORDER — SODIUM CHLORIDE 0.9 % IV SOLN: 250.0000 mL | INTRAVENOUS | Status: DC | PRN

## 2020-05-31 MED ORDER — MIDAZOLAM HCL 5 MG/5ML IJ SOLN
INTRAMUSCULAR | Status: AC
  Filled 2020-05-31: qty 5

## 2020-05-31 MED ORDER — SODIUM CHLORIDE 0.9% FLUSH: 10.0000 mL | INTRAVENOUS | Status: DC | PRN

## 2020-05-31 MED ORDER — IODIXANOL 320 MG/ML IV SOLN
INTRAVENOUS | Status: DC | PRN
  Administered 2020-05-31: 75 mL

## 2020-05-31 MED ORDER — HYDROMORPHONE HCL 1 MG/ML IJ SOLN: 1.0000 mg | Freq: Once | INTRAMUSCULAR | Status: DC | PRN

## 2020-05-31 MED ORDER — ASPIRIN EC 81 MG PO TBEC
81.0000 mg | DELAYED_RELEASE_TABLET | Freq: Every day | ORAL | Status: DC
  Administered 2020-06-01: 81 mg via ORAL
  Filled 2020-05-31: qty 1

## 2020-05-31 MED ORDER — SODIUM CHLORIDE 0.9 % IV SOLN
500.0000 mg | Freq: Every day | INTRAVENOUS | Status: DC
  Administered 2020-05-31 – 2020-06-01 (×2): 500 mg via INTRAVENOUS
  Filled 2020-05-31 (×3): qty 10

## 2020-05-31 MED ORDER — SODIUM CHLORIDE 0.9% FLUSH: 3.0000 mL | Freq: Two times a day (BID) | INTRAVENOUS | Status: DC

## 2020-05-31 MED ORDER — HEPARIN SODIUM (PORCINE) 1000 UNIT/ML IJ SOLN
INTRAMUSCULAR | Status: DC | PRN
  Administered 2020-05-31: 5000 [IU] via INTRAVENOUS

## 2020-05-31 MED ORDER — DIPHENHYDRAMINE HCL 50 MG/ML IJ SOLN: 50.0000 mg | Freq: Once | INTRAMUSCULAR | Status: DC | PRN

## 2020-05-31 MED ORDER — CEFAZOLIN SODIUM-DEXTROSE 2-4 GM/100ML-% IV SOLN
2.0000 g | Freq: Once | INTRAVENOUS | Status: DC
  Filled 2020-05-31: qty 100

## 2020-05-31 MED ORDER — FAMOTIDINE 20 MG PO TABS: 40.0000 mg | ORAL_TABLET | Freq: Once | ORAL | Status: DC | PRN

## 2020-05-31 MED ORDER — HEPARIN SODIUM (PORCINE) 1000 UNIT/ML IJ SOLN
INTRAMUSCULAR | Status: AC
  Filled 2020-05-31: qty 1

## 2020-05-31 MED ORDER — CHLORHEXIDINE GLUCONATE CLOTH 2 % EX PADS
6.0000 | MEDICATED_PAD | Freq: Every day | CUTANEOUS | Status: DC
  Administered 2020-05-31 – 2020-06-01 (×2): 6 via TOPICAL

## 2020-05-31 SURGICAL SUPPLY — 20 items
BALLN ULTRSCOR 014 2.5X200X150 (BALLOONS) ×2
BALLN ULTRVRSE 2X300X150 (BALLOONS) ×1
BALLN ULTRVRSE 2X300X150 OTW (BALLOONS) ×1
BALLOON ULTRSC 014 2.5X200X150 (BALLOONS) ×1 IMPLANT
BALLOON ULTRVRSE 2X300X150 OTW (BALLOONS) ×1 IMPLANT
CANNULA 5F STIFF (CANNULA) ×2 IMPLANT
CATH 0.018 NAVICROSS ST 150 (CATHETERS) ×2 IMPLANT
CATH SEEKER .035X150CM (CATHETERS) ×2 IMPLANT
CATH TEMPO 5F RIM 65CM (CATHETERS) ×2 IMPLANT
CATH VERT 5X100 (CATHETERS) ×2 IMPLANT
COVER PROBE U/S 5X48 (MISCELLANEOUS) ×2 IMPLANT
DEVICE STARCLOSE SE CLOSURE (Vascular Products) ×2 IMPLANT
GLIDEWIRE ADV .035X260CM (WIRE) ×2 IMPLANT
GUIDEWIRE PFTE-COATED .018X300 (WIRE) ×2 IMPLANT
KIT ENCORE 26 ADVANTAGE (KITS) ×2 IMPLANT
PACK ANGIOGRAPHY (CUSTOM PROCEDURE TRAY) ×2 IMPLANT
SHEATH BRITE TIP 5FRX11 (SHEATH) ×2 IMPLANT
SHEATH RAABE 6FR (SHEATH) ×2 IMPLANT
WIRE G V18X300CM (WIRE) ×2 IMPLANT
WIRE RUNTHROUGH .014X300CM (WIRE) ×2 IMPLANT

## 2020-05-31 NOTE — TOC Progression Note (Signed)
Transition of Care [facility]) - Progression Note    Patient Details  Name: Scott Howard MRN: [id number] Date of Birth: 08/05/1960  Transition [facility]) CM/SW Contact  Barrie Dunker, RN Phone Number: 05/31/2020, 9:06 AM  Clinical Narrative:     Vianne Bulls with [facility] to set up Home infusion for Long term ABX, she will set up [facility] for Nursing as well      Expected Discharge Plan and Services                                                 Social Determinants of Health (SDOH) Interventions    Readmission Risk Interventions No flowsheet data found.

## 2020-05-31 NOTE — Progress Notes (Signed)
Peripherally Inserted Central Catheter Placement  The IV Nurse has discussed with the patient and/or persons authorized to consent for the patient, the purpose of this procedure and the potential benefits and risks involved with this procedure.  The benefits include less needle sticks, lab draws from the catheter, and the patient may be discharged home with the catheter. Risks include, but not limited to, infection, bleeding, blood clot (thrombus formation), and puncture of an artery; nerve damage and irregular heartbeat and possibility to perform a PICC exchange if needed/ordered by physician.  Alternatives to this procedure were also discussed.  Bard Power PICC patient education guide, fact sheet on infection prevention and patient information card has been provided to patient /or left at bedside.    PICC Placement Documentation  PICC Single Lumen 05/31/20 Left Basilic 45 cm 0 cm (Active)  Indication for Insertion or Continuance of Line Prolonged intravenous therapies 05/31/20 1335  Exposed Catheter (cm) 0 cm 05/31/20 1335  Site Assessment Clean;Dry;Intact 05/31/20 1335  Line Status Flushed;Blood return noted;Saline locked 05/31/20 1335  Dressing Type Transparent 05/31/20 1335  Dressing Status Clean;Dry;Intact 05/31/20 1335  Antimicrobial disc in place? Yes 05/31/20 1335  Dressing Change Due 05/31/20 05/31/20 1335       Audrie Gallus 05/31/2020, 1:38 PM

## 2020-05-31 NOTE — Interval H&P Note (Signed)
History and Physical Interval Note:  05/31/2020 3:50 PM  Scott Howard  has presented today for surgery, with the diagnosis of Atherosclerotic disease with ulceration.  The various methods of treatment have been discussed with the patient and family. After consideration of risks, benefits and other options for treatment, the patient has consented to  Procedure(s): Lower Extremity Angiography (Right) as a surgical intervention.  The patient's history has been reviewed, patient examined, no change in status, stable for surgery.  I have reviewed the patient's chart and labs.  Questions were answered to the patient's satisfaction.     [name]

## 2020-05-31 NOTE — TOC Progression Note (Signed)
Transition of Care [facility]) - Progression Note    Patient Details  Name: Scott Howard MRN: [id number] Date of Birth: 1960-09-08  Transition of Care [facility]) CM/SW [place], RN Phone Number: 05/31/2020, 9:13 AM  Clinical Narrative:   Met with the patient to discuss DC plan and needs He will have his sister staying with him and acting as the caregiver, I explained about [facility] being set up and that they will manage the Home ABX, He will need a RW, Dr will change the dressing on his foot in the office, I notified Suanne Marker with [employer] to bring a RW to the room to take home at DC          Expected Discharge Plan and Services                                                 Social Determinants of Health (SDOH) Interventions    Readmission Risk Interventions No flowsheet data found.

## 2020-05-31 NOTE — Progress Notes (Signed)
PHARMACY CONSULT NOTE FOR:  OUTPATIENT  PARENTERAL ANTIBIOTIC THERAPY (OPAT)  Indication: diabetic foot infection with osteomyelitis Regimen: daptomycin 500mg  IV q24h End date: 06/26/2020  NOTE - patient to also get levofloxacin 750mg  PO daily  IV antibiotic discharge orders are pended. To discharging provider:  please sign these orders via discharge navigator,  Select New Orders & click on the button choice - Manage This Unsigned Work.     Thank you for allowing pharmacy to be a part of this patient's care.  06/28/2020, PharmD, BCPS.   Work Cell: [phone] 05/31/2020 3:06 PM

## 2020-05-31 NOTE — Progress Notes (Signed)
Patient ID: Scott Howard, male   DOB: [birth year], 60 y.o.   MRN: [id number]  Patient with some low back pain after angiogram.  Hopefully just from lying on procedure table.  Will get ct abd/pelvis to r/o retroperitoneal bleed since had vascular procedure today.  Dr Renae Gloss

## 2020-05-31 NOTE — Progress Notes (Signed)
Called IV team, no answer 

## 2020-05-31 NOTE — Consult Note (Signed)
ANTICOAGULATION CONSULT NOTE   Pharmacy Consult for Heparin Indication: Multiple tibial angioplasties left lower extremity  Patient Measurements: Heparin Dosing Weight: 69.9 kg  Labs: Recent Labs    05/30/20 0714 05/30/20 1351 05/30/20 1953 05/31/20 0621 05/31/20 0622  HGB 10.7*  --   --   --  12.2*  HCT 30.9*  --   --   --  34.6*  PLT 206  --   --   --  219  HEPARINUNFRC 0.70 0.58 0.40  --  0.58  CREATININE 0.80  --   --   --  0.96  CKTOTAL  --   --   --  32*  --     Estimated Creatinine Clearance: 73.8 mL/min (by C-G formula based on SCr of 0.96 mg/dL).   Medical History: Past Medical History:  Diagnosis Date  . Diabetes mellitus without complication (HCC)   . Hyperlipidemia    borderline  . Hypertension     Assessment: Pharmacy consulted to start heparin for multiple tibial angioplasties left lower extremity. No bolus for the start, but can follow nomogram afterwards. No DOAC PTA.   Date Time HL Rate/Comment 4/30 0150 <0.10 950 units/hr 4/30 1402 0.11;  1200 units/hr 4/30 2142 0.25;  1400 units/hr 5/1 2338 0.18:   bolus 2100,inc to 1800 units/hr.  5/2 0714 0.70  (upper limit of therapeutic) 1800 > 1750 un/hr 5/2 1351 0.58; thera x1; 1750 un/hr 5/2 1953 0.40 Therapeutic x 2 @ 1750 unit/hr  5/3 0622 0.58 Therapeutic x 3  Patient is POD2 amputation of left fourth and fifth toe with excisional debridement ulceration left forefoot with podiatry and s/p angiography on 5/3. Pharmacy has been consulted to resume heparin at previously therapeutic rate with no initial bolus.  Goal of Therapy:  Heparin level 0.3-0.7 units/ml Monitor platelets by anticoagulation protocol: Yes   Plan:  Restart heparin infusion at 1750 unit/hr with no initial bolus Will check HL in 6 hours and daily while on heparin Monitor daily CBC, s/s of bleed   Derrek Gu, PharmD 05/31/2020 6:24 PM

## 2020-05-31 NOTE — Progress Notes (Signed)
Physical Therapy Treatment Patient Details Name: Scott Howard MRN: [id number] DOB: [birth year] Today's Date: 05/31/2020    History of Present Illness Pt is a 60 y.o. male with medical history significant for type 2 diabetes mellitus complicated by peripheral polyneuropathy and bilateral foot ulcers, hypertension, hyperlipidemia, who is admitted to [facility] on 05/27/2020 with osteomyelitis of the left fifth toe.  Pt diagnosed with gangrene and osteomyelitis and is s/p fourth and fifth toe amputations and ray resections of the left foot.    PT Comments    Pt was pleasant and motivated to participate during the session.  Pt required no physical assistance during the session and was steady with transfers and gait.  Pt demonstrated good carryover of proper sequencing during transfers and gait to ensure BLE WB compliance.  Pt reported no adverse symptoms during the session other than mild L foot pain that did not worsen with activity.  Follow surgeon's recommendation for follow-up therapies.     Follow Up Recommendations  Follow surgeon's recommendation for DC plan and follow-up therapies     Equipment Recommendations  Rolling walker with 5" wheels    Recommendations for Other Services       Precautions / Restrictions Precautions Precautions: None Restrictions Weight Bearing Restrictions: Yes RLE Weight Bearing: Partial weight bearing LLE Weight Bearing: Partial weight bearing Other Position/Activity Restrictions: PWB through bilateral heels only with RW and with standard post-op shoe donned to the R and OrthoWedge shoe donned to the L, limited distances only    Mobility  Bed Mobility Overal bed mobility: Independent                  Transfers Overall transfer level: Needs assistance Equipment used: Rolling walker (2 wheeled) Transfers: Sit to/from Stand Sit to Stand: Supervision         General transfer comment: Min verbal cues for hand  placement  Ambulation/Gait Ambulation/Gait assistance: Supervision Gait Distance (Feet): 20 Feet Assistive device: Rolling walker (2 wheeled);Crutches Gait Pattern/deviations: Step-to pattern;Decreased step length - right;Decreased step length - left Gait velocity: decreased   General Gait Details: Min verbal and visual cues for safe sequencing with the RW   Stairs             Wheelchair Mobility    Modified Rankin (Stroke Patients Only)       Balance Overall balance assessment: Needs assistance   Sitting balance-Leahy Scale: Normal     Standing balance support: Bilateral upper extremity supported;During functional activity Standing balance-Leahy Scale: Good                              Cognition Arousal/Alertness: Awake/alert Behavior During Therapy: WFL for tasks assessed/performed Overall Cognitive Status: Within Functional Limits for tasks assessed                                        Exercises Total Joint Exercises Hip ABduction/ADduction: AROM;Strengthening;Both;10 reps Straight Leg Raises: AROM;Strengthening;Both;10 reps Long Arc Quad: Strengthening;Both;10 reps (with manual resistance) Knee Flexion: Strengthening;Both;10 reps (with manual resistance) Other Exercises Other Exercises: Positioning education/review for BLE elevation and to prevent BLE heel pressure    General Comments        Pertinent Vitals/Pain Pain Assessment: 0-10 Pain Score: 1  Pain Location: L foot Pain Descriptors / Indicators: Sore Pain Intervention(s): Premedicated before session;Monitored during session  Home Living                      Prior Function            PT Goals (current goals can now be found in the care plan section) Progress towards PT goals: Progressing toward goals    Frequency    7X/week      PT Plan Current plan remains appropriate    Co-evaluation              AM-PAC PT "6 Clicks"  Mobility   Outcome Measure  Help needed turning from your back to your side while in a flat bed without using bedrails?: None Help needed moving from lying on your back to sitting on the side of a flat bed without using bedrails?: None Help needed moving to and from a bed to a chair (including a wheelchair)?: A Little Help needed standing up from a chair using your arms (e.g., wheelchair or bedside chair)?: A Little Help needed to walk in hospital room?: A Little Help needed climbing 3-5 steps with a railing? : A Little 6 Click Score: 20    End of Session Equipment Utilized During Treatment: Gait belt Activity Tolerance: Patient tolerated treatment well Patient left: in bed;with nursing/sitter in room;with call bell/phone within reach;Other (comment) (Pt declined up in chair) Nurse Communication: Mobility status;Weight bearing status PT Visit Diagnosis: Difficulty in walking, not elsewhere classified (R26.2);Pain Pain - Right/Left: Left Pain - part of body: Ankle and joints of foot     Time: 1100-1127 PT Time Calculation (min) (ACUTE ONLY): 27 min  Charges:  $Gait Training: 8-22 mins $Therapeutic Exercise: 8-22 mins                     D. Scott Hiliana Eilts PT, DPT 05/31/20, 1:41 PM
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
## 2020-05-31 NOTE — Progress Notes (Signed)
Patient ID: Scott Howard, male   DOB: 09-21-1960, 60 y.o.   MRN: [id number] [facility] PROGRESS NOTE  Scott Howard [id number]:[id number] DOB: 11/14/1960 DOA: 05/27/2020 PCP: Mick Sell, MD  HPI/Subjective: Patient feeling fine.  Does not complain of any pain in the foot.  Supposed to get PICC line and angiogram today of the right lower extremity.  Came in with foot infection and is postoperative day 2 for amputation of the fourth and fifth toes on the left foot.  Objective: Vitals:   05/31/20 0316 05/31/20 0921  BP: (!) 150/88 (!) 170/80  Pulse: 64 72  Resp: 18 18  Temp: 97.7 F (36.5 C) 97.9 F (36.6 C)  SpO2: 100% 98%    Intake/Output Summary (Last 24 hours) at 05/31/2020 1334 Last data filed at 05/31/2020 9924 Gross per 24 hour  Intake 2559.01 ml  Output 700 ml  Net 1859.01 ml   Filed Weights   05/27/20 1506 05/28/20 0518 05/31/20 0316  Weight: 69.9 kg 70.9 kg 70.9 kg    ROS: Review of Systems  Respiratory: Negative for shortness of breath.   Cardiovascular: Negative for chest pain.  Gastrointestinal: Negative for abdominal pain, nausea and vomiting.  Musculoskeletal: Negative for joint pain.   Exam: Physical Exam HENT:     Head: Normocephalic.     Mouth/Throat:     Pharynx: No oropharyngeal exudate.  Eyes:     General: Lids are normal.     Conjunctiva/sclera: Conjunctivae normal.  Cardiovascular:     Rate and Rhythm: Normal rate and regular rhythm.     Heart sounds: Normal heart sounds, S1 normal and S2 normal.  Pulmonary:     Breath sounds: Normal breath sounds. No decreased breath sounds, wheezing, rhonchi or rales.  Abdominal:     Palpations: Abdomen is soft.     Tenderness: There is no abdominal tenderness.  Musculoskeletal:     Right ankle: No swelling.     Left ankle: No swelling.  Skin:    General: Skin is warm.     Findings: No rash.     Comments: Bilateral feet covered.  Neurological:     Mental Status: He is alert and oriented to  person, place, and time.       Data Reviewed: Basic Metabolic Panel: Recent Labs  Lab 05/27/20 1157 05/28/20 0434 05/30/20 0714 05/31/20 0622  NA 133* 136 134* 136  K 4.5 4.5 4.1 4.3  CL 100 105 104 102  CO2 24 24 23 25   GLUCOSE 139* 143* 169* 121*  BUN 22* 25* 22* 16  CREATININE 1.06 1.09 0.80 0.96  CALCIUM 9.2 8.7* 8.5* 9.0  MG 1.6* 1.6* 1.7  --    Liver Function Tests: Recent Labs  Lab 05/27/20 1157  AST 10*  ALT 10  ALKPHOS 72  BILITOT 1.4*  PROT 8.0  ALBUMIN 3.4*   CBC: Recent Labs  Lab 05/27/20 1157 05/28/20 0434 05/30/20 0714 05/31/20 0622  WBC 7.4 7.1 9.7 6.1  NEUTROABS 5.1  --   --   --   HGB 12.7* 11.5* 10.7* 12.2*  HCT 36.9* 33.0* 30.9* 34.6*  MCV 84.2 85.1 84.2 84.2  PLT 268 236 206 219    CBG: Recent Labs  Lab 05/30/20 1114 05/30/20 1727 05/30/20 2132 05/31/20 0834 05/31/20 1210  GLUCAP 199* 119* 121* 115* 133*    Recent Results (from the past 240 hour(s))  Resp Panel by RT-PCR (Flu A&B, Covid) Nasopharyngeal Swab     Status: None  Collection Time: 05/27/20  1:44 PM   Specimen: Nasopharyngeal Swab; Nasopharyngeal(NP) swabs in vial transport medium  Result Value Ref Range Status   SARS Coronavirus 2 by RT PCR NEGATIVE NEGATIVE Final    Comment: (NOTE) SARS-CoV-2 target nucleic acids are NOT DETECTED.  The SARS-CoV-2 RNA is generally detectable in upper respiratory specimens during the acute phase of infection. The lowest concentration of SARS-CoV-2 viral copies this assay can detect is 138 copies/mL. A negative result does not preclude SARS-Cov-2 infection and should not be used as the sole basis for treatment or other patient management decisions. A negative result may occur with  improper specimen collection/handling, submission of specimen other than nasopharyngeal swab, presence of viral mutation(s) within the areas targeted by this assay, and inadequate number of viral copies(<138 copies/mL). A negative result must be  combined with clinical observations, patient history, and epidemiological information. The expected result is Negative.  Fact Sheet for Patients:  BloggerCourse.com  Fact Sheet for Healthcare Providers:  SeriousBroker.it  This test is no t yet approved or cleared by the [place] FDA and  has been authorized for detection and/or diagnosis of SARS-CoV-2 by FDA under an Emergency Use Authorization (EUA). This EUA will remain  in effect (meaning this test can be used) for the duration of the COVID-19 declaration under Section 564(b)(1) of the Act, 21 U.S.C.section 360bbb-3(b)(1), unless the authorization is terminated  or revoked sooner.       Influenza A by PCR NEGATIVE NEGATIVE Final   Influenza B by PCR NEGATIVE NEGATIVE Final    Comment: (NOTE) The Xpert Xpress SARS-CoV-2/FLU/RSV plus assay is intended as an aid in the diagnosis of influenza from Nasopharyngeal swab specimens and should not be used as a sole basis for treatment. Nasal washings and aspirates are unacceptable for Xpert Xpress SARS-CoV-2/FLU/RSV testing.  Fact Sheet for Patients: BloggerCourse.com  Fact Sheet for Healthcare Providers: SeriousBroker.it  This test is not yet approved or cleared by the [place] FDA and has been authorized for detection and/or diagnosis of SARS-CoV-2 by FDA under an Emergency Use Authorization (EUA). This EUA will remain in effect (meaning this test can be used) for the duration of the COVID-19 declaration under Section 564(b)(1) of the Act, 21 U.S.C. section 360bbb-3(b)(1), unless the authorization is terminated or revoked.  Performed at [facility], [address] [place], [place] [address]   Culture, blood (Routine X 2) w Reflex to ID Panel     Status: None (Preliminary result)   Collection Time: 05/27/20  7:25 PM   Specimen: BLOOD  Result Value Ref  Range Status   Specimen Description BLOOD BLOOD RIGHT HAND  Final   Special Requests   Final    BOTTLES DRAWN AEROBIC AND ANAEROBIC Blood Culture adequate volume   Culture   Final    NO GROWTH 4 DAYS Performed at [facility], [address], [place], [place] [address]    Report Status PENDING  Incomplete  Culture, blood (Routine X 2) w Reflex to ID Panel     Status: None (Preliminary result)   Collection Time: 05/27/20  7:25 PM   Specimen: BLOOD  Result Value Ref Range Status   Specimen Description BLOOD RIGHT ANTECUBITAL  Final   Special Requests   Final    BOTTLES DRAWN AEROBIC AND ANAEROBIC Blood Culture adequate volume   Culture   Final    NO GROWTH 4 DAYS Performed at [facility], [address], [place], [place] [address]  Report Status PENDING  Incomplete  Aerobic/Anaerobic Culture w Gram Stain (surgical/deep wound)     Status: None (Preliminary result)   Collection Time: 05/29/20  9:09 AM   Specimen: PATH Amputaion Arm/Leg; Tissue  Result Value Ref Range Status   Specimen Description   Final    BONE LEFT FOOT BONE Performed at [facility], [address] [place], [place] [address]    Special Requests   Final    NONE Performed at [facility], [address] [place], [place] [address]    Gram Stain NO WBC SEEN NO ORGANISMS SEEN   Final   Culture   Final    NO GROWTH 2 DAYS NO ANAEROBES ISOLATED; CULTURE IN PROGRESS FOR 5 DAYS Performed at [facility], [address] [place], [place] [address]    Report Status PENDING  Incomplete     Studies: US EKG SITE RITE  Result Date: 05/30/2020 If Site Rite image not attached, placement could not be confirmed due to current cardiac rhythm.   Scheduled Meds: . collagenase   Topical Daily  . [START ON 06/01/2020] glipiZIDE  5 mg Oral BID AC  . insulin aspart  0-5 Units Subcutaneous QHS  . insulin aspart  0-9 Units Subcutaneous TID WC  . levofloxacin  750 mg Oral Q1400   . lisinopril  10 mg Oral Daily  . magnesium oxide  400 mg Oral Daily  . pravastatin  20 mg Oral QHS  . sodium chloride flush  3 mL Intravenous Q12H   Continuous Infusions: . sodium chloride 75 mL/hr at 05/30/20 0312  . sodium chloride    . heparin 1,750 Units/hr (05/31/20 0056)  . vancomycin     Brief history: Patient admitted 05/27/2020 with osteomyelitis of the left fifth toe.  The patient was started on antibiotics.  Angiogram of the left lower extremity was done and patient had thrombectomy and 2 angioplasties.  Patient had MRI which shows osteomyelitis fifth toe and fourth toe.  Amputation of the fourth and fifth toe was done by Dr. Alberteen Spindleline podiatry on 05/29/2020.  PICC line to be placed on 05/31/2020.  Dr. Sampson GoonFitzgerald will make his recommendations for IV antibiotics.  Patient will have another angiogram of the right lower extremity on 05/31/2020.  Assessment/Plan:  1. 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  Postoperative day 2 for amputation of the left fourth and fifth toe.  PICC line placed today.  Patient will go home with IV antibiotics as per infectious disease.  Transitional care team setting up home health. 2. Multiple ulcerations right foot, present on admission we will have angiogram today of the right lower extremity. 3. Peripheral vascular disease.  Dr. Gilda CreaseSchnier did an angiogram of the left lower extremity with multiple angioplasties on 05/27/2020.  Patient will have an angiogram of the right lower extremity today.  Need to get vascular surgeries recommendations on blood thinners prior to disposition.  Currently on IV heparin. 4. Type 2 diabetes mellitus with hyperlipidemia.  Continue pravastatin.  Holding Glucotrol today can restart tomorrow.  Currently on sliding scale insulin.  Hemoglobin A1c 6.7. 5. Essential hypertension and proteinuria on lisinopril. 6. Hypomagnesemia.  Patient received IV magnesium and oral magnesium  Code Status:      Code Status Orders  (From admission, onward)         Start     Ordered   05/27/20 1407  Full code  Continuous  05/27/20 1407        Code Status History    This patient has a current code status but no historical code status.   Advance Care Planning Activity     Disposition Plan: Status is: Inpatient  Dispo: The patient is from: Home              Anticipated d/c is to: Home with home health              Patient currently to have an angiogram today.  If kidney function is good may be able to go home tomorrow with home health.   Difficult to place patient.  No.  Consultants:  Podiatry  Vascular surgery  Infectious disease  Procedures:  Angiogram left lower extremity 05/27/2020  Amputation left fourth and fifth toes on 05/29/2020  PICC line placement  Antibiotics:  Vancomycin  Rocephin  Time spent: 27 minutes  [employer]enny[employer]

## 2020-05-31 NOTE — Progress Notes (Signed)
Dr. Gilda Crease at bedside, speaking with pt. Re: procedural results. Pt. Verbalized understanding of conversatio.

## 2020-05-31 NOTE — Op Note (Signed)
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  Post-operative diagnosis: Same  Procedure(s) Performed: 1. Introduction catheter into right lower extremity 3rd order catheter placement  2. Contrast injection right lower extremity for distal runoff with additional 3rd order  3. Percutaneous transluminal angioplasty right posterior tibial to 2.5 mm              4. Star close closure left common femoral arteriotomy  Anesthesia: Conscious sedation was administered under my direct supervision by the interventional radiology RN. IV Versed plus fentanyl were utilized. Continuous ECG, pulse oximetry and blood pressure was monitored throughout the entire procedure.  Conscious sedation was for a total of 57 minutes and 49 seconds.  Sheath: 6 [place] Raby left common femoral retrograde  Contrast: 75 cc  Fluoroscopy Time: 11.5 minutes  Indications: Scott Howard presents with multiple ulcers of the right foot associated with atherosclerotic changes of the right lower extremity. This suggests the patient is having limb threatening ischemia. The risks and benefits of angiography with hope for intervention for limb salvage are reviewed all questions answered patient agrees to proceed.  Procedure:Scott Howard is a 60 y.o. y.o. male who was identified and appropriate procedural time out was performed. The patient was then placed supine on the table and prepped and draped in the usual sterile fashion.   Ultrasound was placed in the sterile sleeve and the left groin was evaluated the left common femoral artery was echolucent and pulsatile indicating patency. Image was recorded for the permanent record and under  real-time visualization a microneedle was inserted into the common femoral artery followed by the microwire and then the micro-sheath. A J-wire was then advanced through the micro-sheath and a 5 [place] sheath was then inserted over a J-wire. J-wire was then advanced and a 5 French rim catheter was positioned above the aortic bifurcation and a LAO view of the pelvis was obtained. Subsequently a rim catheter with the advantage Glidewire was used to cross the aortic bifurcation the catheter wire were advanced down into the right distal external iliac artery. Oblique view of the femoral bifurcation was then obtained and subsequently the wire was reintroduced and the pigtail catheter negotiated into the SFA representing third order catheter placement. Distal runoff was then performed.  Diagnostic interpretation: The abdominal aorta was imaged last week there is no indication to reimage it at this time as there were no significant abnormalities.  In an LAO projection the right common internal and external iliac arteries all appear widely patent.  The right common femoral profunda femoris and femoral-popliteal arteries are widely patent there minimal atherosclerotic changes noted but no hemodynamically significant lesions.  The trifurcation demonstrates increasing disease with occlusion of the anterior tibial just distal to the origin.  The anterior tibial remains occluded throughout its entire length including the proximal half of the dorsalis pedis.  The tibioperoneal trunk is widely patent the peroneal is patent and the dominant runoff to the foot proximally there is a 30% lesion noted.  The posterior tibial has a greater than 90% stenosis at its origin there are multiple occlusions along its entire length with associated greater than 90% stenoses as well.  Distally the posterior tibial does fill the plantar vessels.  Total length of disease posterior tibial is 35 cm.  5000 units of heparin was then given  and  allowed to circulate and a 6 [place] Raby sheath was advanced up and over the bifurcation and positioned in the femoral artery  Kumpe catheter and advantage Glidewire were then negotiated down into the distal popliteal. Hand injection contrast demonstrated the tibial anatomy in detail.  The advantage wire was then negotiated into the posterior tibial followed by the catheter.  I could only get the Kumpe catheter to advance but approximately halfway to the ankle.  I then switched to a 0.018V 18 wire as well as a 0.018 advantage wire and then exchanged the Kumpe for a Nava cross catheter and was able to get all the way down past the ankle.  Initially a 2 mm x 300 mm Ultraverse balloon was used to angioplasty the posterior tibial.  The inflation was for 2 minutes at 12 atm.  I then exchanged the 018 wire for a 0.014 wire through the balloon and used a 2.5 mm x 200 mm ultra score balloon beginning distally at the ankle 2 separate inflations were required both were to 10 atm for 1 minute.  Follow-up imaging demonstrated excellent patency with less than 10% residual stenosis throughout the length of the posterior tibial.  After review of these images the sheath is pulled into the left external iliac oblique of the common femoral is obtained and a Star close device deployed. There no immediate Complications.  Findings:  The abdominal aorta was imaged last week there is no indication to reimage it at this time as there were no significant abnormalities.  In an LAO projection the right common internal and external iliac arteries all appear widely patent.  The right common femoral profunda femoris and femoral-popliteal arteries are widely patent there minimal atherosclerotic changes noted but no hemodynamically significant lesions.  The trifurcation demonstrates increasing disease with occlusion of the anterior tibial just distal to the origin.  The anterior tibial remains occluded throughout its entire length  including the proximal half of the dorsalis pedis.  The tibioperoneal trunk is widely patent the peroneal is patent and the dominant runoff to the foot proximally there is a 30% lesion noted.  The posterior tibial has a greater than 90% stenosis at its origin there are multiple occlusions along its entire length with associated greater than 90% stenoses as well.  Distally the posterior tibial does fill the plantar vessels.  Total length of disease posterior tibial is 35 cm.  Following angioplasty the right posterior tibial now is now widely patent, there is less than 10% residual stenosis.   Summary: Successful recanalization right lower extremity for limb salvage   Disposition: Patient was taken to the recovery room in stable condition having tolerated the procedure well.  Scott Howard 05/31/2020,5:10 PM

## 2020-05-31 NOTE — Progress Notes (Signed)
Infectious Disease Long Term IV Antibiotic Orders Ramari Bray Jares 1960-07-13  Diagnosis: DFI with osteomyelitis  Culture results Enterobacter on otpt culture. Surg cx NGTD  LABS Lab Results  Component Value Date   CREATININE 0.96 05/31/2020   Lab Results  Component Value Date   WBC 6.1 05/31/2020   HGB 12.2 (L) 05/31/2020   HCT 34.6 (L) 05/31/2020   MCV 84.2 05/31/2020   PLT 219 05/31/2020   Lab Results  Component Value Date   ESRSEDRATE 83 (H) 05/27/2020   Lab Results  Component Value Date   CRP 3.3 (H) 05/27/2020    Allergies:  Allergies  Allergen Reactions  . Sulfa Antibiotics Rash  . Silvadene [Silver Sulfadiazine]     Discharge antibiotics Daptomycin 500 mg q 24  Levofloxacin 750 mg po qd  PICC Care per protocol Labs weekly while on IV antibiotics -FAX weekly labs to [phone] CBC w diff   Cr, LFTs CPK  CRP   Planned duration of antibiotics 4 weeks tentative-   Stop date May 29th Follow up clinic date 2 weeks    Mick Sell, MD

## 2020-05-31 NOTE — Progress Notes (Signed)
Spoke with Primary RN re: PICC insertion, clarified that RN called but no answer and this IV RN did not hear any phone call. PICC will be done today 05/31/20.

## 2020-05-31 NOTE — Consult Note (Signed)
ANTICOAGULATION CONSULT NOTE   Pharmacy Consult for Heparin Indication: Multiple tibial angioplasties left lower extremity  Patient Measurements: Heparin Dosing Weight: 69.9 kg  Labs: Recent Labs    05/30/20 0714 05/30/20 1351 05/30/20 1953 05/31/20 0622  HGB 10.7*  --   --  12.2*  HCT 30.9*  --   --  34.6*  PLT 206  --   --  219  HEPARINUNFRC 0.70 0.58 0.40 0.58  CREATININE 0.80  --   --  0.96    Estimated Creatinine Clearance: 73.8 mL/min (by C-G formula based on SCr of 0.96 mg/dL).   Medical History: Past Medical History:  Diagnosis Date  . Diabetes mellitus without complication (HCC)   . Hyperlipidemia    borderline  . Hypertension     Assessment: Pharmacy consulted to start heparin for multiple tibial angioplasties left lower extremity. No bolus for the start, but can follow nomogram afterwards. No DOAC PTA.  Date Time HL Rate/Comment 4/30 0150 <0.10 950 units/hr 4/30 1402 0.11;  1200 units/hr 4/30 2142 0.25;  1400 units/hr 5/1 2338 0.18:   bolus 2100,inc to 1800 units/hr.  5/2 0714 0.70  (upper limit of therapeutic) 1800 > 1750 un/hr 5/2 1351 0.58; thera x1; 1750 un/hr 5/2 1953 0.40 Therapeutic x 2 @ 1750 unit/hr  5/3 0622 0.58 Therapeutic x 3  Patient is POD # 0 from amputation left fifth toe with partial fifth ray, amputation left fourth toe with partial fourth ray, and excisional debridement ulceration left forefoot with podiatry. Per vascular, plan is to continue heparin infusion as planning for angiogram on Tuesday/Wednesday.  Goal of Therapy:  Heparin level 0.3-0.7 units/ml Monitor platelets by anticoagulation protocol: Yes   Plan:   HL remains therapeutic on  1750 un/hr.  Will continue at current rate and recheck HL with AM labs . CTM daily CBC while on therapeutic heparin   Otelia Sergeant, PharmD, [facility] 05/31/2020 7:02 AM

## 2020-06-01 ENCOUNTER — Encounter: Payer: Self-pay | Admitting: Vascular Surgery

## 2020-06-01 LAB — CULTURE, BLOOD (ROUTINE X 2)
Culture: NO GROWTH
Culture: NO GROWTH
Special Requests: ADEQUATE
Special Requests: ADEQUATE

## 2020-06-01 LAB — BASIC METABOLIC PANEL
Anion gap: 7 (ref 5–15)
BUN: 17 mg/dL (ref 6–20)
CO2: 26 mmol/L (ref 22–32)
Calcium: 8.6 mg/dL — ABNORMAL LOW (ref 8.9–10.3)
Chloride: 100 mmol/L (ref 98–111)
Creatinine, Ser: 0.84 mg/dL (ref 0.61–1.24)
GFR, Estimated: >60 mL/min (ref >60)
Glucose, Bld: 163 mg/dL — ABNORMAL HIGH (ref 70–99)
Potassium: 3.9 mmol/L (ref 3.5–5.1)
Sodium: 133 mmol/L — ABNORMAL LOW (ref 135–145)

## 2020-06-01 LAB — HEPARIN LEVEL (UNFRACTIONATED)
Heparin Unfractionated: 0.34 IU/mL (ref 0.30–0.70)
Heparin Unfractionated: 0.4 IU/mL (ref 0.30–0.70)

## 2020-06-01 LAB — CBC
HCT: 31.1 % — ABNORMAL LOW (ref 39.0–52.0)
Hemoglobin: 10.9 g/dL — ABNORMAL LOW (ref 13.0–17.0)
MCH: 29.4 pg (ref 26.0–34.0)
MCHC: 35 g/dL (ref 30.0–36.0)
MCV: 83.8 fL (ref 80.0–100.0)
Platelets: 218 10*3/uL (ref 150–400)
RBC: 3.71 MIL/uL — ABNORMAL LOW (ref 4.22–5.81)
RDW: 11.9 % (ref 11.5–15.5)
WBC: 6.1 10*3/uL (ref 4.0–10.5)
nRBC: 0 % (ref 0.0–0.2)

## 2020-06-01 LAB — SURGICAL PATHOLOGY

## 2020-06-01 LAB — GLUCOSE, CAPILLARY
Glucose-Capillary: 158 mg/dL — ABNORMAL HIGH (ref 70–99)
Glucose-Capillary: 117 mg/dL — ABNORMAL HIGH (ref 70–99)

## 2020-06-01 MED ORDER — CLOPIDOGREL BISULFATE 75 MG PO TABS: 75.0000 mg | ORAL_TABLET | Freq: Every day | ORAL | 3 refills | Status: AC

## 2020-06-01 MED ORDER — MAGNESIUM OXIDE -MG SUPPLEMENT 400 (240 MG) MG PO TABS: 400.0000 mg | ORAL_TABLET | Freq: Every day | ORAL | 0 refills | Status: DC

## 2020-06-01 MED ORDER — ASPIRIN 81 MG PO TBEC: 81.0000 mg | DELAYED_RELEASE_TABLET | Freq: Every day | ORAL | 11 refills | Status: AC

## 2020-06-01 MED ORDER — OXYCODONE HCL 5 MG PO TABS: 5.0000 mg | ORAL_TABLET | ORAL | 0 refills | Status: DC | PRN

## 2020-06-01 MED ORDER — LEVOFLOXACIN 750 MG PO TABS: 750.0000 mg | ORAL_TABLET | Freq: Every day | ORAL | 0 refills | Status: DC

## 2020-06-01 MED ORDER — DAPTOMYCIN IV (FOR PTA / DISCHARGE USE ONLY): 500.0000 mg | INTRAVENOUS | 0 refills | Status: DC

## 2020-06-01 MED ORDER — CLOPIDOGREL BISULFATE 75 MG PO TABS: 75.0000 mg | ORAL_TABLET | Freq: Every day | ORAL | Status: DC

## 2020-06-01 NOTE — Consult Note (Signed)
ANTICOAGULATION CONSULT NOTE   Pharmacy Consult for Heparin Indication: Multiple tibial angioplasties left lower extremity  Patient Measurements: Heparin Dosing Weight: 69.9 kg  Labs: Recent Labs    05/30/20 0714 05/30/20 1351 05/30/20 1953 05/31/20 0621 05/31/20 0622 06/01/20 0140  HGB 10.7*  --   --   --  12.2* 10.9*  HCT 30.9*  --   --   --  34.6* 31.1*  PLT 206  --   --   --  219 218  HEPARINUNFRC 0.70   < > 0.40  --  0.58 0.34  CREATININE 0.80  --   --   --  0.96  --   CKTOTAL  --   --   --  32*  --   --    < > = values in this interval not displayed.    Estimated Creatinine Clearance: 73.8 mL/min (by C-G formula based on SCr of 0.96 mg/dL).   Medical History: Past Medical History:  Diagnosis Date  . Diabetes mellitus without complication (HCC)   . Hyperlipidemia    borderline  . Hypertension     Assessment: Pharmacy consulted to start heparin for multiple tibial angioplasties left lower extremity. No bolus for the start, but can follow nomogram afterwards. No DOAC PTA.   Date Time HL Rate/Comment 4/30 0150 <0.10 950 units/hr 4/30 1402 0.11;  1200 units/hr 4/30 2142 0.25;  1400 units/hr 5/1 2338 0.18:   bolus 2100,inc to 1800 units/hr.  5/2 0714 0.70  (upper limit of therapeutic) 1800 > 1750 un/hr 5/2 1351 0.58; thera x1; 1750 un/hr 5/2 1953 0.40 Therapeutic x 2 @ 1750 unit/hr  5/3 0622 0.58 Therapeutic x 3 5/4 0140 0.34 Therapeutic x 1 after restart  Patient is POD2 amputation of left fourth and fifth toe with excisional debridement ulceration left forefoot with podiatry and s/p angiography on 5/3. Pharmacy has been consulted to resume heparin at previously therapeutic rate with no initial bolus.  Goal of Therapy:  Heparin level 0.3-0.7 units/ml Monitor platelets by anticoagulation protocol: Yes   Plan:  Continue heparin infusion at 1750 unit/hr Will recheck HL in 6 hours to confirm, then daily while on heparin Monitor daily CBC, s/s of  bleed  Otelia Sergeant, PharmD, [facility] 06/01/2020 2:35 AM

## 2020-06-01 NOTE — Progress Notes (Addendum)
[facility] Daily Progress Note  Subjective: Patient without complaint this afternoon. Looking forward to discharge home.  Objective: Vitals:   05/31/20 2359 06/01/20 0544 06/01/20 0803 06/01/20 1305  BP: 132/71 (!) 149/81 (!) 146/79 (!) 147/76  Pulse: 75 73 79 74  Resp: 17 17 18 18   Temp: 97.9 F (36.6 C) 97.9 F (36.6 C) 97.6 F (36.4 C) 98.5 F (36.9 C)  TempSrc:  Oral Oral Oral  SpO2: 98% 99% 98% 100%  Weight:      Height:        Intake/Output Summary (Last 24 hours) at 06/01/2020 1348 Last data filed at 06/01/2020 1023 Gross per 24 hour  Intake 1240 ml  Output --  Net 1240 ml   Physical Exam: A&Ox3, NAD CV: RRR Pulmonary: CTA Bilaterally Abdomen: Soft, Non-tender, Non-distended, (+) Bowel Sounds Groin access site:  PAD has been removed. Dressing is clean dry and intact.  No swelling or ecchymosis noted. Vascular:  Right lower extremity: Thigh soft. Calf soft. Extremities warm distally to the dressing recently placed by podiatry. I did not remove this dressing.     Laboratory: CBC    Component Value Date/Time   WBC 6.1 06/01/2020 0140   HGB 10.9 (L) 06/01/2020 0140   HCT 31.1 (L) 06/01/2020 0140   PLT 218 06/01/2020 0140   BMET    Component Value Date/Time   NA 133 (L) 06/01/2020 0500   K 3.9 06/01/2020 0500   CL 100 06/01/2020 0500   CO2 26 06/01/2020 0500   GLUCOSE 163 (H) 06/01/2020 0500   BUN 17 06/01/2020 0500   CREATININE 0.84 06/01/2020 0500   CALCIUM 8.6 (L) 06/01/2020 0500   GFRNONAA >60 06/01/2020 0500   Assessment/Planning: 1) OK to transition from heparin to aspirin and plavix. 2) okay from a vascular surgery standpoint for the person to be discharged when medically stable. 3) will continue to surveilled the patient's disease in the outpatient setting.  Discussed with Dr. 08/01/2020 Janmarie Smoot PA-C 06/01/2020 1:48 PM

## 2020-06-01 NOTE — Progress Notes (Signed)
1 Day Post-Op   Subjective/Chief Complaint: Patient seen.  Doing well.  Had angiogram on the right lower extremity yesterday.   Objective: Vital signs in last 24 hours: Temp:  [97.6 F (36.4 C)-98.3 F (36.8 C)] 97.6 F (36.4 C) (05/04 0803) Pulse Rate:  [65-79] 79 (05/04 0803) Resp:  [17-25] 18 (05/04 0803) BP: (126-171)/(71-88) 146/79 (05/04 0803) SpO2:  [93 %-100 %] 98 % (05/04 0803) Last BM Date: 05/31/20  Intake/Output from previous day: 05/03 0701 - 05/04 0700 In: 1003 [I.V.:1003] Out: -  Intake/Output this shift: Total I/O In: 240 [P.O.:240] Out: -   Bandages are dry and intact.  Upon removal the incision on the left foot remains well coapted with minimal bleeding.  No signs of incisional necrosis or infection.  Plantar ulceration and dorsal ulceration appear to be granulating in with continued eschar over the medial first metatarsal head.  Overall everything appears stable.  Eschared areas on the right foot are all stable and the plantar ulceration on the right foot appears to be significantly granulating in as well.          Lab Results:  Recent Labs    05/31/20 0622 06/01/20 0140  WBC 6.1 6.1  HGB 12.2* 10.9*  HCT 34.6* 31.1*  PLT 219 218   BMET Recent Labs    05/31/20 0622 06/01/20 0500  NA 136 133*  K 4.3 3.9  CL 102 100  CO2 25 26  GLUCOSE 121* 163*  BUN 16 17  CREATININE 0.96 0.84  CALCIUM 9.0 8.6*   PT/INR No results for input(s): LABPROT, INR in the last 72 hours. ABG No results for input(s): PHART, HCO3 in the last 72 hours.  Invalid input(s): PCO2, PO2  Studies/Results: CT ABDOMEN PELVIS WO CONTRAST  Result Date: 05/31/2020 CLINICAL DATA:  Recent lower extremity angiography, suspected retroperitoneal hematoma, left osteomyelitis EXAM: CT ABDOMEN AND PELVIS WITHOUT CONTRAST TECHNIQUE: Multidetector CT imaging of the abdomen and pelvis was performed following the standard protocol without IV contrast. COMPARISON:  None. FINDINGS:  Lower chest: No acute pleural or parenchymal lung disease. Unenhanced CT was performed per clinician order. Lack of IV contrast limits sensitivity and specificity, especially for evaluation of abdominal/pelvic solid viscera. Hepatobiliary: No focal liver abnormality is seen. No gallstones, gallbladder wall thickening, or biliary dilatation. Pancreas: Unremarkable. No pancreatic ductal dilatation or surrounding inflammatory changes. Spleen: Normal in size without focal abnormality. Adrenals/Urinary Tract: Excreted contrast is seen from recent angiography procedure. There is an indeterminate 1.9 x 1.8 cm hypodense lesion within the ventral aspect right kidney, with internal calcifications. Dedicated nonemergent renal MRI is recommended for complete characterization and to evaluate for underlying renal cell carcinoma. The left kidney is unremarkable. The adrenals and bladder are normal. Stomach/Bowel: No bowel obstruction or ileus. Moderate retained stool throughout the colon. Normal appendix right lower quadrant. Vascular/Lymphatic: There is extensive atherosclerosis of the abdominal aorta and its distal branches. No pathologic adenopathy within the abdomen or pelvis. Reproductive: Prostate is unremarkable. Other: No free fluid or free intraperitoneal gas. No evidence of retroperitoneal hematoma. No abdominal wall hernia. Musculoskeletal: There is increased soft tissue density within the muscular attachments along the right side of the pubic symphysis, primarily within the adductor muscular tendinous junction. This is best seen on axial images 82 through 87. Mild asymmetric sclerosis of the ventral aspect right side pubic symphysis is noted at the insertion of the rectus sheath and adductor tendon. No bony destruction or periosteal reaction. No evidence of avulsion fracture. 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 There are no acute or destructive bony lesions. Reconstructed images demonstrate  no additional findings. IMPRESSION: 1. No evidence of retroperitoneal hematoma. 2. 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 No bony destruction or periosteal reaction to suggest infection. Further evaluation with MRI may be useful. 3. Indeterminate 1.9 cm right renal lesion, which will require nonemergent dedicated renal MRI for complete characterization. 4. Moderate fecal retention. Electronically Signed   By: Sharlet Salina M.D.   On: 05/31/2020 20:59   PERIPHERAL VASCULAR CATHETERIZATION  Result Date: 05/31/2020 See Op Note  [place] EKG SITE RITE  Result Date: 05/30/2020 If Site Rite image not attached, placement could not be confirmed due to current cardiac rhythm.   Anti-infectives: Anti-infectives (From admission, onward)   Start     Dose/Rate Route Frequency Ordered Stop   05/31/20 2200  vancomycin (VANCOREADY) IVPB 1750 mg/350 mL  Status:  Discontinued        1,750 mg 175 mL/hr over 120 Minutes Intravenous Every 24 hours 05/31/20 1054 05/31/20 1419   05/31/20 1800  DAPTOmycin (CUBICIN) 500 mg in sodium chloride 0.9 % IVPB        500 mg 120 mL/hr over 30 Minutes Intravenous Daily 05/31/20 1435     05/31/20 1515  ceFAZolin (ANCEF) IVPB 2g/100 mL premix  Status:  Discontinued       Note to Pharmacy: To be given in specials   2 g 200 mL/hr over 30 Minutes Intravenous  Once 05/31/20 1418 05/31/20 1747   05/30/20 1800  levofloxacin (LEVAQUIN) tablet 750 mg        750 mg Oral Daily 05/30/20 1659 06/26/20 2359   05/30/20 1400  vancomycin (VANCOREADY) IVPB 1000 mg/200 mL  Status:  Discontinued        1,000 mg 200 mL/hr over 60 Minutes Intravenous Every 12 hours 05/30/20 1301 05/31/20 1054   05/29/20 0859  vancomycin (VANCOCIN) powder  Status:  Discontinued          As needed 05/29/20 0859 05/29/20 0944   05/28/20 2000  vancomycin (VANCOREADY) IVPB 1500 mg/300 mL   Status:  Discontinued        1,500 mg 150 mL/hr over 120 Minutes Intravenous Every 24 hours 05/27/20 1456 05/30/20 1301   05/28/20 0000  ceFAZolin (ANCEF) IVPB 2g/100 mL premix       Note to Pharmacy: To be given in specials   2 g 200 mL/hr over 30 Minutes Intravenous  Once 05/27/20 1556 05/27/20 1710   05/27/20 1800  vancomycin (VANCOREADY) IVPB 1750 mg/350 mL        1,750 mg 175 mL/hr over 120 Minutes Intravenous  Once 05/27/20 1539 05/27/20 2220   05/27/20 1600  vancomycin (VANCOREADY) IVPB 1750 mg/350 mL  Status:  Discontinued        1,750 mg 175 mL/hr over 120 Minutes Intravenous  Once 05/27/20 1456 05/27/20 1539   05/27/20 1500  cefTRIAXone (ROCEPHIN) 2 g in sodium chloride 0.9 % 100 mL IVPB  Status:  Discontinued        2 g 200 mL/hr over 30 Minutes Intravenous Every 24 hours 05/27/20 1442 05/30/20 1659      Assessment/Plan: s/p Procedure(s): Lower Extremity Angiography (Right) Assessment: Stable status post fourth and fifth ray resection left foot.  Multiple bilateral ulcerations status post bilateral vascular interventions.   Plan: Betadine applied to the incision area with Xeroform to  the plantar and dorsal ulcerations on the left foot followed by a gauze bandage.  We will have the nurses reapply Santyl ointment and dressings to the right foot.  Patient should be stable for discharge at this point on IV antibiotics and local wound care managed by home health and followed by infectious disease.  Plan for follow-up outpatient in 1 week.  Patient understands limited weightbearing on the feet.  For now podiatry will sign off and follow outpatient.  LOS: 5 days    [name] 06/01/2020

## 2020-06-01 NOTE — Progress Notes (Addendum)
Scott Howard to be D/C'd Home per MD order.  Discussed prescriptions and follow up appointments with the patient. Prescriptions given to patient, medication list explained in detail. Pt verbalized understanding.  Allergies as of 06/01/2020      Reactions   Sulfa Antibiotics Rash   Silvadene [silver Sulfadiazine]       Medication List    STOP taking these medications   doxycycline 100 MG tablet Commonly known as: VIBRA-TABS   ibuprofen 800 MG tablet Commonly known as: ADVIL     TAKE these medications   aspirin 81 MG EC tablet Take 1 tablet (81 mg total) by mouth daily. Swallow whole. Start taking on: Jun 02, 2020   clopidogrel 75 MG tablet Commonly known as: Plavix Take 1 tablet (75 mg total) by mouth daily.   daptomycin  IVPB Commonly known as: CUBICIN Inject 500 mg into the vein daily for 25 days. Indication: diabetic foot infection and osteomyelitis First Dose: Yes Last Day of Therapy:  06/26/2020 Labs - Once weekly:  CBC/D, CMP, CRP and CPK Method of administration: IV Push Method of administration may be changed at the discretion of home infusion pharmacist based upon assessment of the patient and/or caregiver's ability to self-administer the medication ordered.   glipiZIDE 5 MG tablet Commonly known as: GLUCOTROL Take 5 mg by mouth 2 (two) times daily.   levofloxacin 750 MG tablet Commonly known as: LEVAQUIN Take 1 tablet (750 mg total) by mouth daily at 2 PM for 28 doses.   lisinopril 10 MG tablet Commonly known as: ZESTRIL Take 1 tablet by mouth daily.   magnesium oxide 400 (240 Mg) MG tablet Commonly known as: MAG-OX Take 1 tablet (400 mg total) by mouth daily. Start taking on: Jun 02, 2020   oxyCODONE 5 MG immediate release tablet Commonly known as: Oxy IR/ROXICODONE Take 1-2 tablets (5-10 mg total) by mouth every 4 (four) hours as needed for moderate pain.   pravastatin 20 MG tablet Commonly known as: PRAVACHOL Take 20 mg by mouth at bedtime.    Santyl ointment Generic drug: collagenase Apply topically daily.            Durable Medical Equipment  (From admission, onward)         Start     Ordered   05/31/20 0917  For home use only DME Walker rolling  Once       Question Answer Comment  Walker: With 5 Inch Wheels   Patient needs a walker to treat with the following condition Weakness      05/31/20 0916           Discharge Care Instructions  (From admission, onward)         Start     Ordered   06/01/20 0000  Change dressing on IV access line weekly and PRN  (Home infusion instructions - [facility] )        06/01/20 1327   06/01/20 0000  Leave dressing on - Keep it clean, dry, and intact until clinic visit       Comments: Wound care at home will help you with dressing   06/01/20 1327          Vitals:   06/01/20 1305 06/01/20 1528  BP: (!) 147/76 (!) 141/67  Pulse: 74 73  Resp: 18 16  Temp: 98.5 F (36.9 C) 98.3 F (36.8 C)  SpO2: 100% 98%    Skin clean, dry and intact without evidence of skin break down,  no evidence of skin tears noted. IV catheter discontinued intact. Site without signs and symptoms of complications. Dressing and pressure applied. Pt denies pain at this time. No complaints noted. Pt d/c with PICC line for long term antibiotics, home health to follow up at home. Pt educated on dressing changes to bilateral feet wound, supplies provided to pt and follow up with Dr. Honor Loh podiatry next Wednesday.  An After Visit Summary was printed and given to the patient. Patient escorted via WC, and D/C home via private auto.  Scott Howard

## 2020-06-03 ENCOUNTER — Other Ambulatory Visit: Payer: 59

## 2020-06-03 LAB — AEROBIC/ANAEROBIC CULTURE W GRAM STAIN (SURGICAL/DEEP WOUND)
Culture: NO GROWTH
Gram Stain: NONE SEEN

## 2020-06-03 NOTE — Discharge Summary (Signed)
Physician Discharge Summary  Scott Howard LGX:[id number] DOB: 1960/02/01 DOA: 05/27/2020  PCP: [name], MD  Admit date: 05/27/2020 Discharge date: 06/03/2020  Admitted From: Home Disposition: Home  Recommendations for Outpatient Follow-up:  1. Follow up with PCP in 1-2 weeks 2. Follow-up with infectious disease 3. Follow-up with vascular surgery 4. Please obtain BMP/CBC in one week 5. Please follow up on the following pending results: None  Home Health: Yes Equipment/Devices: PICC line for IV antibiotics Discharge Condition: Stable CODE STATUS: Full Diet recommendation: Heart Healthy / Carb Modified   Brief/Interim Summary: Patient admitted 05/27/2020 with osteomyelitis of the left fifth toe.  The patient was started on antibiotics.  Angiogram of the left lower extremity was done and patient had thrombectomy and 2 angioplasties.  Patient had MRI which shows osteomyelitis fifth toe and fourth toe.  Amputation of the fourth and fifth toe was done by Dr. Cleda Mccreedy podiatry on 05/29/2020.  Patient underwent another angiogram of lower extremity with multiple angioplasties.  Tolerated the procedure well.  Did developed back pain after the surgery so CT abdomen was obtained for concern of postoperative bleeding.  It was negative for any retroperitoneal hematoma. Patient initially received heparin infusion after the procedure which was later transitioned to aspirin and Plavix by vascular surgery.  He will follow-up with them as an outpatient for further recommendations.  Patient grew Enterobacter on outpatient culture from his wound.  Surgical cultures remain negative.  Infectious disease was consulted and they recommended discharging on 4 weeks of daptomycin and Levaquin and he will follow-up with them as an outpatient for further recommendations.  Patient did received IV magnesium for hypomagnesemia.  He will continue rest of his home medications and follow-up with his  providers.  Discharge Diagnoses:  Principal Problem:   Pyogenic inflammation of bone ([place]) Active Problems:   Diabetic polyneuropathy associated with type 2 diabetes mellitus (HCC)   Essential hypertension   Hyperlipidemia   Type 2 diabetes mellitus with hyperlipidemia (HCC)   Dehydration   Acute prerenal azotemia   Diabetic foot ulcer ([place])   PVD (peripheral vascular disease) ([place])   Hypomagnesemia   Discharge Instructions  Discharge Instructions    [facility] pharmacist to adjust dose for Vancomycin, Aminoglycosides and other anti-infective therapies as requested by physician.   Complete by: As directed    [facility] to provide Cath Flo 32m   Complete by: As directed    Administer for PICC line occlusion and as ordered by physician for other access device issues.   Anaphylaxis Kit: Provided to treat any anaphylactic reaction to the medication being provided to the patient if First Dose or when requested by physician   Complete by: As directed    Epinephrine 17mml vial / amp: Administer 0.78m40m0.78ml48mubcutaneously once for moderate to severe anaphylaxis, nurse to call physician and pharmacy when reaction occurs and call 911 if needed for immediate care   Diphenhydramine 50mg35mIV vial: Administer 25-50mg 24mM PRN for first dose reaction, rash, itching, mild reaction, nurse to call physician and pharmacy when reaction occurs   Sodium Chloride 0.9% NS 500ml I58mdminister if needed for hypovolemic blood pressure drop or as ordered by physician after call to physician with anaphylactic reaction   Change dressing on IV access line weekly and PRN   Complete by: As directed    Diet - low sodium heart healthy   Complete by: As directed    Discharge instructions   Complete by: As directed  It was pleasure taking care of you. Continue taking your antibiotics as advised for 4 more weeks.  Daptomycin will be IV through your PICC line and levofloxacin by  mouth. Please follow the directions carefully and follow-up with your doctors according to your follow-up advises.   Flush IV access with Sodium Chloride 0.9% and Heparin 10 units/ml or 100 units/ml   Complete by: As directed    Home infusion instructions - [facility]   Complete by: As directed    Instructions: Flush IV access with Sodium Chloride 0.9% and Heparin 10units/ml or 100units/ml   Change dressing on IV access line: Weekly and PRN   Instructions Cath Flo 53m: Administer for PICC Line occlusion and as ordered by physician for other access device   [facility] pharmacist to adjust dose for: Vancomycin, Aminoglycosides and other anti-infective therapies as requested by physician   Increase activity slowly   Complete by: As directed    Leave dressing on - Keep it clean, dry, and intact until clinic visit   Complete by: As directed    Wound care at home will help you with dressing   Method of administration may be changed at the discretion of home infusion pharmacist based upon assessment of the patient and/or caregiver's ability to self-administer the medication ordered   Complete by: As directed      Allergies as of 06/01/2020      Reactions   Sulfa Antibiotics Rash   Silvadene [silver Sulfadiazine]       Medication List    STOP taking these medications   doxycycline 100 MG tablet Commonly known as: VIBRA-TABS   ibuprofen 800 MG tablet Commonly known as: ADVIL     TAKE these medications   aspirin 81 MG EC tablet Take 1 tablet (81 mg total) by mouth daily. Swallow whole.   clopidogrel 75 MG tablet Commonly known as: Plavix Take 1 tablet (75 mg total) by mouth daily.   daptomycin  IVPB Commonly known as: CUBICIN Inject 500 mg into the vein daily for 25 days. Indication: diabetic foot infection and osteomyelitis First Dose: Yes Last Day of Therapy:  06/26/2020 Labs - Once weekly:  CBC/D, CMP, CRP and CPK Method of administration: IV  Push Method of administration may be changed at the discretion of home infusion pharmacist based upon assessment of the patient and/or caregiver's ability to self-administer the medication ordered.   glipiZIDE 5 MG tablet Commonly known as: GLUCOTROL Take 5 mg by mouth 2 (two) times daily.   levofloxacin 750 MG tablet Commonly known as: LEVAQUIN Take 1 tablet (750 mg total) by mouth daily at 2 PM for 28 doses.   lisinopril 10 MG tablet Commonly known as: ZESTRIL Take 1 tablet by mouth daily.   magnesium oxide 400 (240 Mg) MG tablet Commonly known as: MAG-OX Take 1 tablet (400 mg total) by mouth daily.   oxyCODONE 5 MG immediate release tablet Commonly known as: Oxy IR/ROXICODONE Take 1-2 tablets (5-10 mg total) by mouth every 4 (four) hours as needed for moderate pain.   pravastatin 20 MG tablet Commonly known as: PRAVACHOL Take 20 mg by mouth at bedtime.   Santyl ointment Generic drug: collagenase Apply topically daily.            Discharge Care Instructions  (From admission, onward)         Start     Ordered   06/01/20 0000  Change dressing on IV access line weekly and PRN  (Home infusion  instructions - [facility] )        06/01/20 1327   06/01/20 0000  Leave dressing on - Keep it clean, dry, and intact until clinic visit       Comments: Wound care at home will help you with dressing   06/01/20 1327          Follow-up Information    Schnier, Dolores Lory, MD On 06/22/2020.   Specialties: Vascular Surgery, Cardiology, Radiology, Vascular Surgery Why: Appt w/  Arna Medici.  Will need an ABI with visit. @ 9:00 am Contact information: [place] [address] [phone]        [name], DPM. Go on 06/08/2020.   Specialty: Podiatry Why: @ 9:45 am Contact information: [place] [address] [phone]              Allergies  Allergen Reactions  . Sulfa Antibiotics Rash  . Silvadene [Silver  Sulfadiazine]     Consultations:  Infectious disease  Vascular surgery  Podiatry  Procedures/Studies: CT ABDOMEN PELVIS WO CONTRAST  Result Date: 05/31/2020 CLINICAL DATA:  Recent lower extremity angiography, suspected retroperitoneal hematoma, left osteomyelitis EXAM: CT ABDOMEN AND PELVIS WITHOUT CONTRAST TECHNIQUE: Multidetector CT imaging of the abdomen and pelvis was performed following the standard protocol without IV contrast. COMPARISON:  None. FINDINGS: Lower chest: No acute pleural or parenchymal lung disease. Unenhanced CT was performed per clinician order. Lack of IV contrast limits sensitivity and specificity, especially for evaluation of abdominal/pelvic solid viscera. Hepatobiliary: No focal liver abnormality is seen. No gallstones, gallbladder wall thickening, or biliary dilatation. Pancreas: Unremarkable. No pancreatic ductal dilatation or surrounding inflammatory changes. Spleen: Normal in size without focal abnormality. Adrenals/Urinary Tract: Excreted contrast is seen from recent angiography procedure. There is an indeterminate 1.9 x 1.8 cm hypodense lesion within the ventral aspect right kidney, with internal calcifications. Dedicated nonemergent renal MRI is recommended for complete characterization and to evaluate for underlying renal cell carcinoma. The left kidney is unremarkable. The adrenals and bladder are normal. Stomach/Bowel: No bowel obstruction or ileus. Moderate retained stool throughout the colon. Normal appendix right lower quadrant. Vascular/Lymphatic: There is extensive atherosclerosis of the abdominal aorta and its distal branches. No pathologic adenopathy within the abdomen or pelvis. Reproductive: Prostate is unremarkable. Other: No free fluid or free intraperitoneal gas. No evidence of retroperitoneal hematoma. No abdominal wall hernia. Musculoskeletal: There is increased soft tissue density within the muscular attachments along the right side of the pubic  symphysis, primarily within the adductor muscular tendinous junction. This is best seen on axial images 82 through 87. Mild asymmetric sclerosis of the ventral aspect right side pubic symphysis is noted at the insertion of the rectus sheath and adductor tendon. No bony destruction or periosteal reaction. No evidence of avulsion fracture. 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 There are no acute or destructive bony lesions. Reconstructed images demonstrate no additional findings. IMPRESSION: 1. No evidence of retroperitoneal hematoma. 2. 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 No bony destruction or periosteal reaction to suggest infection. Further evaluation with MRI may be useful. 3. Indeterminate 1.9 cm right renal lesion, which will require nonemergent dedicated renal MRI for complete characterization. 4. Moderate fecal retention. Electronically Signed   By: Randa Ngo M.D.   On: 05/31/2020 20:59   MR FOOT LEFT WO CONTRAST  Result Date: 05/28/2020 CLINICAL DATA:  60 year old male with significant history of diabetes with neuropathy who sustained an injury to both of his feet a couple of months ago while in [place] walking on a hot boardwalk. Possible osteomyelitis of the fourth and fifth toes on recent x-ray. EXAM: MRI OF THE LEFT FOOT WITHOUT CONTRAST TECHNIQUE: Multiplanar, multisequence MR imaging of the left foot was performed. No intravenous contrast was administered. COMPARISON:  None. FINDINGS: Bones/Joint/Cartilage Severe bone marrow edema throughout the fifth metatarsal, fifth proximal phalanx and fifth distal phalanx most concerning for osteomyelitis. Fifth PIP joint effusion which may be reactive versus reflective of septic arthritis. Bone marrow edema in the fourth proximal phalanx, fourth middle phalanx and possibly fourth distal phalanx  also concerning for osteomyelitis. Mild bone marrow edema in the shaft of the fourth metatarsal. Mild bone marrow edema at the base of the third metatarsal. Bone marrow edema in the first metatarsal head extending into the proximal shaft and at the base of the first proximal phalanx which may reflect stress reaction versus osteomyelitis. No acute fracture or dislocation. Normal alignment. No other joint effusion. Ligaments Collateral ligaments are intact.  Lisfranc ligament is intact. Muscles and Tendons Flexor, peroneal and extensor compartment tendons are intact. Generalized edema throughout the plantar musculature which may be reactive versus secondary to myositis. Soft tissue No fluid collection or hematoma. No soft tissue mass. Generalized soft tissue edema along the dorsal aspect of the foot. IMPRESSION: 1. Severe bone marrow edema throughout the fifth metatarsal, fifth proximal phalanx and fifth distal phalanx most concerning for osteomyelitis. Fifth PIP joint effusion which may be reactive versus reflective of septic arthritis. 2. Bone marrow edema in the fourth proximal phalanx, fourth middle phalanx and possibly fourth distal phalanx also concerning for osteomyelitis versus stress reaction. 3. Mild bone marrow edema in the shaft of the fourth metatarsal. Mild bone marrow edema at the base of the third metatarsal. The changes may reflect mild stress reaction versus early osteomyelitis. 4. Bone marrow edema in the first metatarsal head extending into the proximal shaft and at the base of the first proximal phalanx which may reflect stress reaction versus osteomyelitis. Electronically Signed   By: Kathreen Devoid   On: 05/28/2020 08:04   PERIPHERAL VASCULAR CATHETERIZATION  Result Date: 05/31/2020 See Op Note  PERIPHERAL VASCULAR CATHETERIZATION  Result Date: 05/27/2020 See op note   VAS [place] ABI WITH/WO TBI  Result Date: 05/26/2020  LOWER EXTREMITY DOPPLER STUDY Patient Name:  Scott Howard  Date of  Exam:   05/26/2020 Medical Rec #: [id number]       Accession #:    [id number] Date of Birth: 04/05/1960       Patient Gender: M Patient Age:   60Y Exam Location:  [facility] Procedure:      VAS [place] ABI WITH/WO TBI Referring Phys: [id number] [place] --------------------------------------------------------------------------------  Indications: Peripheral artery disease.  Performing Technologist: Almira Coaster RVS  Examination Guidelines: A complete evaluation includes at minimum, Doppler waveform signals and systolic blood pressure reading at the level of bilateral brachial, anterior tibial, and posterior tibial arteries, when vessel segments are accessible. Bilateral testing is considered an integral part of a complete examination. Photoelectric Plethysmograph (PPG) waveforms and toe systolic pressure readings are included as required and additional duplex testing as needed. Limited examinations for reoccurring indications may be performed as noted.  ABI Findings: +---------+------------------+-----+--------+--------+ Right    Rt Pressure (mmHg)IndexWaveformComment  +---------+------------------+-----+--------+--------+ Brachial 149                                     +---------+------------------+-----+--------+--------+  ATA      139               0.89 biphasic         +---------+------------------+-----+--------+--------+ PTA      193               1.23 biphasic         +---------+------------------+-----+--------+--------+ Great Toe250               1.59 Normal  [place]       +---------+------------------+-----+--------+--------+ +---------+------------------+-----+--------+-------+ Left     Lt Pressure (mmHg)IndexWaveformComment +---------+------------------+-----+--------+-------+ Brachial 157                                    +---------+------------------+-----+--------+-------+ ATA      250               1.59 biphasic         +---------+------------------+-----+--------+-------+ PTA      250               1.59 biphasic        +---------+------------------+-----+--------+-------+ Great Toe115               0.73 Normal          +---------+------------------+-----+--------+-------+ +-------+-----------+-----------+------------+------------+ ABI/TBIToday's ABIToday's TBIPrevious ABIPrevious TBI +-------+-----------+-----------+------------+------------+ Right  1.23       >1.0 [place]                             +-------+-----------+-----------+------------+------------+ Left   >1.0 [place]    .73                                 +-------+-----------+-----------+------------+------------+  Summary: Right: Resting right ankle-brachial index is within normal range. No evidence of significant right lower extremity arterial disease. The right toe-brachial index is normal. Left: Resting left ankle-brachial index indicates noncompressible left lower extremity arteries. The left toe-brachial index is normal.  *See table(s) above for measurements and observations.  Electronically signed by Hortencia Pilar MD on 05/26/2020 at 5:50:25 PM.    Final    [place] EKG SITE RITE  Result Date: 05/30/2020 If Site Rite image not attached, placement could not be confirmed due to current cardiac rhythm.    Subjective: Patient was seen and examined before discharge.  Denies any complaint and wants to go home.  Discharge Exam: Vitals:   06/01/20 1305 06/01/20 1528  BP: (!) 147/76 (!) 141/67  Pulse: 74 73  Resp: 18 16  Temp: 98.5 F (36.9 C) 98.3 F (36.8 C)  SpO2: 100% 98%   Vitals:   06/01/20 0544 06/01/20 0803 06/01/20 1305 06/01/20 1528  BP: (!) 149/81 (!) 146/79 (!) 147/76 (!) 141/67  Pulse: 73 79 74 73  Resp: '17 18 18 16  ' Temp: 97.9 F (36.6 C) 97.6 F (36.4 C) 98.5 F (36.9 C) 98.3 F (36.8 C)  TempSrc: Oral Oral Oral   SpO2: 99% 98% 100% 98%  Weight:      Height:        General: Pt is alert, awake, not in  acute distress Cardiovascular: RRR, S1/S2 +, no rubs, no gallops Respiratory: CTA bilaterally, no wheezing, no rhonchi Abdominal: Soft, NT, ND, bowel sounds + Extremities: no edema, no cyanosis, left fourth and fifth ray amputation.   The results of significant  diagnostics from this hospitalization (including imaging, microbiology, ancillary and laboratory) are listed below for reference.    Microbiology: Recent Results (from the past 240 hour(s))  Resp Panel by RT-PCR (Flu A&B, Covid) Nasopharyngeal Swab     Status: None   Collection Time: 05/27/20  1:44 PM   Specimen: Nasopharyngeal Swab; Nasopharyngeal(NP) swabs in vial transport medium  Result Value Ref Range Status   SARS Coronavirus 2 by RT PCR NEGATIVE NEGATIVE Final    Comment: (NOTE) SARS-CoV-2 target nucleic acids are NOT DETECTED.  The SARS-CoV-2 RNA is generally detectable in upper respiratory specimens during the acute phase of infection. The lowest concentration of SARS-CoV-2 viral copies this assay can detect is 138 copies/mL. A negative result does not preclude SARS-Cov-2 infection and should not be used as the sole basis for treatment or other patient management decisions. A negative result may occur with  improper specimen collection/handling, submission of specimen other than nasopharyngeal swab, presence of viral mutation(s) within the areas targeted by this assay, and inadequate number of viral copies(<138 copies/mL). A negative result must be combined with clinical observations, patient history, and epidemiological information. The expected result is Negative.  Fact Sheet for Patients:  EntrepreneurPulse.com.au  Fact Sheet for Healthcare Providers:  IncredibleEmployment.be  This test is no t yet approved or cleared by the [place] FDA and  has been authorized for detection and/or diagnosis of SARS-CoV-2 by FDA under an Emergency Use Authorization (EUA). This EUA  will remain  in effect (meaning this test can be used) for the duration of the COVID-19 declaration under Section 564(b)(1) of the Act, 21 U.S.C.section 360bbb-3(b)(1), unless the authorization is terminated  or revoked sooner.       Influenza A by PCR NEGATIVE NEGATIVE Final   Influenza B by PCR NEGATIVE NEGATIVE Final    Comment: (NOTE) The Xpert Xpress SARS-CoV-2/FLU/RSV plus assay is intended as an aid in the diagnosis of influenza from Nasopharyngeal swab specimens and should not be used as a sole basis for treatment. Nasal washings and aspirates are unacceptable for Xpert Xpress SARS-CoV-2/FLU/RSV testing.  Fact Sheet for Patients: EntrepreneurPulse.com.au  Fact Sheet for Healthcare Providers: IncredibleEmployment.be  This test is not yet approved or cleared by the [place] FDA and has been authorized for detection and/or diagnosis of SARS-CoV-2 by FDA under an Emergency Use Authorization (EUA). This EUA will remain in effect (meaning this test can be used) for the duration of the COVID-19 declaration under Section 564(b)(1) of the Act, 21 U.S.C. section 360bbb-3(b)(1), unless the authorization is terminated or revoked.  Performed at [facility], [address] [place], [place] [address]   Culture, blood (Routine X 2) w Reflex to ID Panel     Status: None   Collection Time: 05/27/20  7:25 PM   Specimen: BLOOD  Result Value Ref Range Status   Specimen Description BLOOD BLOOD RIGHT HAND  Final   Special Requests   Final    BOTTLES DRAWN AEROBIC AND ANAEROBIC Blood Culture adequate volume   Culture   Final    NO GROWTH 5 DAYS Performed at [facility], [address] [place], [place] [address]    Report Status 06/01/2020 FINAL  Final  Culture, blood (Routine X 2) w Reflex to ID Panel     Status: None   Collection Time: 05/27/20  7:25 PM   Specimen: BLOOD  Result Value Ref Range Status   Specimen  Description BLOOD RIGHT ANTECUBITAL  Final   Special Requests  Final    BOTTLES DRAWN AEROBIC AND ANAEROBIC Blood Culture adequate volume   Culture   Final    NO GROWTH 5 DAYS Performed at [facility], [address] [place], [place] [address]    Report Status 06/01/2020 FINAL  Final  Aerobic/Anaerobic Culture w Gram Stain (surgical/deep wound)     Status: None   Collection Time: 05/29/20  9:09 AM   Specimen: PATH Amputaion Arm/Leg; Tissue  Result Value Ref Range Status   Specimen Description   Final    BONE LEFT FOOT BONE Performed at [facility], [address] [place], [place] [address]    Special Requests   Final    NONE Performed at [facility], [place], [place] [address]    Gram Stain NO WBC SEEN NO ORGANISMS SEEN   Final   Culture   Final    No growth aerobically or anaerobically. Performed at [facility], [place] [address], [place],  [address]    Report Status 06/03/2020 FINAL  Final     Labs: BNP (last 3 results) No results for input(s): BNP in the last 8760 hours. Basic Metabolic Panel: Recent Labs  Lab 05/28/20 0434 05/30/20 0714 05/31/20 0622 06/01/20 0500  NA 136 134* 136 133*  K 4.5 4.1 4.3 3.9  CL 105 104 102 100  CO2 '24 23 25 26  ' GLUCOSE 143* 169* 121* 163*  BUN 25* 22* 16 17  CREATININE 1.09 0.80 0.96 0.84  CALCIUM 8.7* 8.5* 9.0 8.6*  MG 1.6* 1.7  --   --    Liver Function Tests: No results for input(s): AST, ALT, ALKPHOS, BILITOT, PROT, ALBUMIN in the last 168 hours. No results for input(s): LIPASE, AMYLASE in the last 168 hours. No results for input(s): AMMONIA in the last 168 hours. CBC: Recent Labs  Lab 05/28/20 0434 05/30/20 0714 05/31/20 0622 06/01/20 0140  WBC 7.1 9.7 6.1 6.1  HGB 11.5* 10.7* 12.2* 10.9*  HCT 33.0* 30.9* 34.6* 31.1*  MCV 85.1 84.2 84.2 83.8  PLT 236 206 219 218   Cardiac Enzymes: Recent Labs  Lab 05/31/20 0621  CKTOTAL 32*   BNP: Invalid  input(s): POCBNP CBG: Recent Labs  Lab 05/31/20 1503 05/31/20 1716 05/31/20 2110 06/01/20 0809 06/01/20 1241  GLUCAP 118* 118* 167* 158* 117*   D-Dimer No results for input(s): DDIMER in the last 72 hours. Hgb A1c No results for input(s): HGBA1C in the last 72 hours. Lipid Profile No results for input(s): CHOL, HDL, LDLCALC, TRIG, CHOLHDL, LDLDIRECT in the last 72 hours. Thyroid function studies No results for input(s): TSH, T4TOTAL, T3FREE, THYROIDAB in the last 72 hours.  Invalid input(s): FREET3 Anemia work up No results for input(s): VITAMINB12, FOLATE, FERRITIN, TIBC, IRON, RETICCTPCT in the last 72 hours. Urinalysis    Component Value Date/Time   COLORURINE YELLOW (A) 05/28/2020 0530   APPEARANCEUR CLEAR (A) 05/28/2020 0530   LABSPEC 1.033 (H) 05/28/2020 0530   PHURINE 5.0 05/28/2020 0530   GLUCOSEU 50 (A) 05/28/2020 0530   HGBUR NEGATIVE 05/28/2020 0530   BILIRUBINUR NEGATIVE 05/28/2020 0530   KETONESUR NEGATIVE 05/28/2020 0530   PROTEINUR NEGATIVE 05/28/2020 0530   NITRITE NEGATIVE 05/28/2020 0530   LEUKOCYTESUR NEGATIVE 05/28/2020 0530   Sepsis Labs Invalid input(s): PROCALCITONIN,  WBC,  LACTICIDVEN Microbiology Recent Results (from the past 240 hour(s))  Resp Panel by RT-PCR (Flu A&B, Covid) Nasopharyngeal Swab     Status: None   Collection Time: 05/27/20  1:44 PM  Specimen: Nasopharyngeal Swab; Nasopharyngeal(NP) swabs in vial transport medium  Result Value Ref Range Status   SARS Coronavirus 2 by RT PCR NEGATIVE NEGATIVE Final    Comment: (NOTE) SARS-CoV-2 target nucleic acids are NOT DETECTED.  The SARS-CoV-2 RNA is generally detectable in upper respiratory specimens during the acute phase of infection. The lowest concentration of SARS-CoV-2 viral copies this assay can detect is 138 copies/mL. A negative result does not preclude SARS-Cov-2 infection and should not be used as the sole basis for treatment or other patient management decisions. A  negative result may occur with  improper specimen collection/handling, submission of specimen other than nasopharyngeal swab, presence of viral mutation(s) within the areas targeted by this assay, and inadequate number of viral copies(<138 copies/mL). A negative result must be combined with clinical observations, patient history, and epidemiological information. The expected result is Negative.  Fact Sheet for Patients:  EntrepreneurPulse.com.au  Fact Sheet for Healthcare Providers:  IncredibleEmployment.be  This test is no t yet approved or cleared by the [place] FDA and  has been authorized for detection and/or diagnosis of SARS-CoV-2 by FDA under an Emergency Use Authorization (EUA). This EUA will remain  in effect (meaning this test can be used) for the duration of the COVID-19 declaration under Section 564(b)(1) of the Act, 21 U.S.C.section 360bbb-3(b)(1), unless the authorization is terminated  or revoked sooner.       Influenza A by PCR NEGATIVE NEGATIVE Final   Influenza B by PCR NEGATIVE NEGATIVE Final    Comment: (NOTE) The Xpert Xpress SARS-CoV-2/FLU/RSV plus assay is intended as an aid in the diagnosis of influenza from Nasopharyngeal swab specimens and should not be used as a sole basis for treatment. Nasal washings and aspirates are unacceptable for Xpert Xpress SARS-CoV-2/FLU/RSV testing.  Fact Sheet for Patients: EntrepreneurPulse.com.au  Fact Sheet for Healthcare Providers: IncredibleEmployment.be  This test is not yet approved or cleared by the [place] FDA and has been authorized for detection and/or diagnosis of SARS-CoV-2 by FDA under an Emergency Use Authorization (EUA). This EUA will remain in effect (meaning this test can be used) for the duration of the COVID-19 declaration under Section 564(b)(1) of the Act, 21 U.S.C. section 360bbb-3(b)(1), unless the authorization  is terminated or revoked.  Performed at [facility], [address] [place], [place] [address]   Culture, blood (Routine X 2) w Reflex to ID Panel     Status: None   Collection Time: 05/27/20  7:25 PM   Specimen: BLOOD  Result Value Ref Range Status   Specimen Description BLOOD BLOOD RIGHT HAND  Final   Special Requests   Final    BOTTLES DRAWN AEROBIC AND ANAEROBIC Blood Culture adequate volume   Culture   Final    NO GROWTH 5 DAYS Performed at [facility], [address], [place], [place] [address]    Report Status 06/01/2020 FINAL  Final  Culture, blood (Routine X 2) w Reflex to ID Panel     Status: None   Collection Time: 05/27/20  7:25 PM   Specimen: BLOOD  Result Value Ref Range Status   Specimen Description BLOOD RIGHT ANTECUBITAL  Final   Special Requests   Final    BOTTLES DRAWN AEROBIC AND ANAEROBIC Blood Culture adequate volume   Culture   Final    NO GROWTH 5 DAYS Performed at [facility], [address], [place], [place] [address]    Report Status 06/01/2020 FINAL  Final  Aerobic/Anaerobic Culture w Gram  Stain (surgical/deep wound)     Status: None   Collection Time: 05/29/20  9:09 AM   Specimen: PATH Amputaion Arm/Leg; Tissue  Result Value Ref Range Status   Specimen Description   Final    BONE LEFT FOOT BONE Performed at [facility], [address], [place], [place] [address]    Special Requests   Final    NONE Performed at [facility], [place], [place] [address]    Gram Stain NO WBC SEEN NO ORGANISMS SEEN   Final   Culture   Final    No growth aerobically or anaerobically. Performed at [facility], [place] [address], [place], [place] [address]    Report Status 06/03/2020 FINAL  Final    Time coordinating discharge: Over 30 minutes  SIGNED:  Lorella Nimrod, MD  [facility] 06/03/2020, 2:55 PM  If 7PM-7AM, please contact night-coverage [email or web address]  This record  has been created using Systems analyst. Errors have been sought and corrected,but may not always be located. Such creation errors do not reflect on the standard of care.

## 2020-06-06 ENCOUNTER — Encounter (INDEPENDENT_AMBULATORY_CARE_PROVIDER_SITE_OTHER): Payer: Self-pay | Admitting: Nurse Practitioner

## 2020-06-06 ENCOUNTER — Other Ambulatory Visit (INDEPENDENT_AMBULATORY_CARE_PROVIDER_SITE_OTHER): Payer: Self-pay | Admitting: Nurse Practitioner

## 2020-06-07 ENCOUNTER — Ambulatory Visit: Admission: RE | Admit: 2020-06-07 | Payer: 59 | Source: Home / Self Care | Admitting: Vascular Surgery

## 2020-06-07 ENCOUNTER — Encounter: Admission: RE | Payer: Self-pay | Source: Home / Self Care

## 2020-06-07 DIAGNOSIS — L97909 Non-pressure chronic ulcer of unspecified part of unspecified lower leg with unspecified severity: Secondary | ICD-10-CM

## 2020-06-07 SURGERY — LOWER EXTREMITY ANGIOGRAPHY
Anesthesia: Moderate Sedation | Laterality: Left

## 2020-06-12 ENCOUNTER — Emergency Department: Payer: 59

## 2020-06-12 ENCOUNTER — Other Ambulatory Visit: Payer: Self-pay

## 2020-06-12 ENCOUNTER — Emergency Department
Admission: EM | Admit: 2020-06-12 | Discharge: 2020-06-12 | Disposition: A | Discharge status: Home / Self Care | Payer: 59 | Source: Home / Self Care | Attending: Emergency Medicine | Admitting: Emergency Medicine

## 2020-06-12 DIAGNOSIS — E860 Dehydration: Secondary | ICD-10-CM | POA: Insufficient documentation

## 2020-06-12 DIAGNOSIS — I10 Essential (primary) hypertension: Secondary | ICD-10-CM | POA: Insufficient documentation

## 2020-06-12 DIAGNOSIS — Z79899 Other long term (current) drug therapy: Secondary | ICD-10-CM | POA: Insufficient documentation

## 2020-06-12 DIAGNOSIS — Z7982 Long term (current) use of aspirin: Secondary | ICD-10-CM | POA: Insufficient documentation

## 2020-06-12 DIAGNOSIS — Z7984 Long term (current) use of oral hypoglycemic drugs: Secondary | ICD-10-CM | POA: Insufficient documentation

## 2020-06-12 DIAGNOSIS — Z7902 Long term (current) use of antithrombotics/antiplatelets: Secondary | ICD-10-CM | POA: Insufficient documentation

## 2020-06-12 DIAGNOSIS — E1142 Type 2 diabetes mellitus with diabetic polyneuropathy: Secondary | ICD-10-CM | POA: Insufficient documentation

## 2020-06-12 DIAGNOSIS — U071 COVID-19: Secondary | ICD-10-CM | POA: Diagnosis not present

## 2020-06-12 LAB — COMPREHENSIVE METABOLIC PANEL
ALT: 14 U/L (ref 0–44)
AST: 14 U/L — ABNORMAL LOW (ref 15–41)
Albumin: 3.5 g/dL (ref 3.5–5.0)
Alkaline Phosphatase: 73 U/L (ref 38–126)
Anion gap: 8 (ref 5–15)
BUN: 23 mg/dL — ABNORMAL HIGH (ref 6–20)
CO2: 25 mmol/L (ref 22–32)
Calcium: 9 mg/dL (ref 8.9–10.3)
Chloride: 98 mmol/L (ref 98–111)
Creatinine, Ser: 1.44 mg/dL — ABNORMAL HIGH (ref 0.61–1.24)
GFR, Estimated: 56 mL/min — ABNORMAL LOW (ref >60)
Glucose, Bld: 132 mg/dL — ABNORMAL HIGH (ref 70–99)
Potassium: 4.6 mmol/L (ref 3.5–5.1)
Sodium: 131 mmol/L — ABNORMAL LOW (ref 135–145)
Total Bilirubin: 1.2 mg/dL (ref 0.3–1.2)
Total Protein: 7.9 g/dL (ref 6.5–8.1)

## 2020-06-12 LAB — CBC WITH DIFFERENTIAL/PLATELET
Abs Immature Granulocytes: 0.02 10*3/uL (ref 0.00–0.07)
Basophils Absolute: 0 10*3/uL (ref 0.0–0.1)
Basophils Relative: 1 %
Eosinophils Absolute: 0 10*3/uL (ref 0.0–0.5)
Eosinophils Relative: 0 %
HCT: 37.4 % — ABNORMAL LOW (ref 39.0–52.0)
Hemoglobin: 12.8 g/dL — ABNORMAL LOW (ref 13.0–17.0)
Immature Granulocytes: 0 %
Lymphocytes Relative: 13 %
Lymphs Abs: 0.8 10*3/uL (ref 0.7–4.0)
MCH: 29.2 pg (ref 26.0–34.0)
MCHC: 34.2 g/dL (ref 30.0–36.0)
MCV: 85.2 fL (ref 80.0–100.0)
Monocytes Absolute: 0.7 10*3/uL (ref 0.1–1.0)
Monocytes Relative: 11 %
Neutro Abs: 4.5 10*3/uL (ref 1.7–7.7)
Neutrophils Relative %: 75 %
Platelets: 192 10*3/uL (ref 150–400)
RBC: 4.39 MIL/uL (ref 4.22–5.81)
RDW: 11.9 % (ref 11.5–15.5)
WBC: 6 10*3/uL (ref 4.0–10.5)
nRBC: 0 % (ref 0.0–0.2)

## 2020-06-12 LAB — LACTIC ACID, PLASMA: Lactic Acid, Venous: 1.3 mmol/L (ref 0.5–1.9)

## 2020-06-12 MED ORDER — SODIUM CHLORIDE 0.9 % IV BOLUS
1000.0000 mL | Freq: Once | INTRAVENOUS | Status: AC
  Administered 2020-06-12: 1000 mL via INTRAVENOUS

## 2020-06-12 MED ORDER — NIRMATRELVIR/RITONAVIR (PAXLOVID)TABLET: 3.0000 | ORAL_TABLET | Freq: Two times a day (BID) | ORAL | 0 refills | Status: DC

## 2020-06-12 MED ORDER — NIRMATRELVIR/RITONAVIR (PAXLOVID)TABLET: 3.0000 | ORAL_TABLET | Freq: Two times a day (BID) | ORAL | 0 refills | Status: AC

## 2020-06-12 NOTE — ED Triage Notes (Addendum)
Pt via POV from home. Pt was sent here by his doctor. Pt is c/o cough, sore throat, congestion, and fatigue. Pt reports low grade fever at 99.5 and took Tylenol at 0930. Pt took a home COVID test this morning that was positive. Pt is A&Ox4 and NAD. Pt is hypotensive at 84/54. Denies SOB/CP.
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
## 2020-06-12 NOTE — ED Notes (Addendum)
First Nurse Note: Pt brought to ED from [facility] for evaluation for possible post op complication. Pt also found to be hypotensive. Pt is COVID +

## 2020-06-13 ENCOUNTER — Other Ambulatory Visit: Payer: Self-pay

## 2020-06-13 ENCOUNTER — Emergency Department: Payer: 59

## 2020-06-13 ENCOUNTER — Inpatient Hospital Stay
Admission: EM | Admit: 2020-06-13 | Discharge: 2020-06-16 | DRG: 177 | Disposition: A | Discharge status: Home / Self Care | Payer: 59 | Attending: Internal Medicine | Admitting: Internal Medicine

## 2020-06-13 DIAGNOSIS — E861 Hypovolemia: Secondary | ICD-10-CM | POA: Diagnosis not present

## 2020-06-13 DIAGNOSIS — N179 Acute kidney failure, unspecified: Secondary | ICD-10-CM

## 2020-06-13 DIAGNOSIS — E1151 Type 2 diabetes mellitus with diabetic peripheral angiopathy without gangrene: Secondary | ICD-10-CM | POA: Diagnosis present

## 2020-06-13 DIAGNOSIS — Z7902 Long term (current) use of antithrombotics/antiplatelets: Secondary | ICD-10-CM

## 2020-06-13 DIAGNOSIS — Z89422 Acquired absence of other left toe(s): Secondary | ICD-10-CM

## 2020-06-13 DIAGNOSIS — Z79899 Other long term (current) drug therapy: Secondary | ICD-10-CM

## 2020-06-13 DIAGNOSIS — E1169 Type 2 diabetes mellitus with other specified complication: Secondary | ICD-10-CM | POA: Diagnosis present

## 2020-06-13 DIAGNOSIS — I959 Hypotension, unspecified: Secondary | ICD-10-CM | POA: Diagnosis present

## 2020-06-13 DIAGNOSIS — Z888 Allergy status to other drugs, medicaments and biological substances status: Secondary | ICD-10-CM

## 2020-06-13 DIAGNOSIS — Z7982 Long term (current) use of aspirin: Secondary | ICD-10-CM

## 2020-06-13 DIAGNOSIS — E11621 Type 2 diabetes mellitus with foot ulcer: Secondary | ICD-10-CM | POA: Diagnosis present

## 2020-06-13 DIAGNOSIS — E860 Dehydration: Secondary | ICD-10-CM

## 2020-06-13 DIAGNOSIS — Z7984 Long term (current) use of oral hypoglycemic drugs: Secondary | ICD-10-CM

## 2020-06-13 DIAGNOSIS — I7025 Atherosclerosis of native arteries of other extremities with ulceration: Secondary | ICD-10-CM | POA: Diagnosis present

## 2020-06-13 DIAGNOSIS — J9601 Acute respiratory failure with hypoxia: Secondary | ICD-10-CM | POA: Diagnosis present

## 2020-06-13 DIAGNOSIS — E785 Hyperlipidemia, unspecified: Secondary | ICD-10-CM | POA: Diagnosis present

## 2020-06-13 DIAGNOSIS — L97429 Non-pressure chronic ulcer of left heel and midfoot with unspecified severity: Secondary | ICD-10-CM | POA: Diagnosis present

## 2020-06-13 DIAGNOSIS — Z882 Allergy status to sulfonamides status: Secondary | ICD-10-CM

## 2020-06-13 DIAGNOSIS — U071 COVID-19: Principal | ICD-10-CM | POA: Diagnosis present

## 2020-06-13 DIAGNOSIS — J1282 Pneumonia due to coronavirus disease 2019: Secondary | ICD-10-CM | POA: Diagnosis present

## 2020-06-13 DIAGNOSIS — L97419 Non-pressure chronic ulcer of right heel and midfoot with unspecified severity: Secondary | ICD-10-CM | POA: Diagnosis present

## 2020-06-13 DIAGNOSIS — L97412 Non-pressure chronic ulcer of right heel and midfoot with fat layer exposed: Secondary | ICD-10-CM

## 2020-06-13 DIAGNOSIS — I70202 Unspecified atherosclerosis of native arteries of extremities, left leg: Secondary | ICD-10-CM | POA: Diagnosis present

## 2020-06-13 DIAGNOSIS — I9589 Other hypotension: Secondary | ICD-10-CM | POA: Diagnosis not present

## 2020-06-13 DIAGNOSIS — E1142 Type 2 diabetes mellitus with diabetic polyneuropathy: Secondary | ICD-10-CM | POA: Diagnosis present

## 2020-06-13 DIAGNOSIS — I10 Essential (primary) hypertension: Secondary | ICD-10-CM | POA: Diagnosis present

## 2020-06-13 LAB — CBC WITH DIFFERENTIAL/PLATELET
Abs Immature Granulocytes: 0.04 10*3/uL (ref 0.00–0.07)
Basophils Absolute: 0 10*3/uL (ref 0.0–0.1)
Basophils Relative: 0 %
Eosinophils Absolute: 0.1 10*3/uL (ref 0.0–0.5)
Eosinophils Relative: 1 %
HCT: 34.7 % — ABNORMAL LOW (ref 39.0–52.0)
Hemoglobin: 12.3 g/dL — ABNORMAL LOW (ref 13.0–17.0)
Immature Granulocytes: 0 %
Lymphocytes Relative: 12 %
Lymphs Abs: 1.1 10*3/uL (ref 0.7–4.0)
MCH: 29.7 pg (ref 26.0–34.0)
MCHC: 35.4 g/dL (ref 30.0–36.0)
MCV: 83.8 fL (ref 80.0–100.0)
Monocytes Absolute: 1.2 10*3/uL — ABNORMAL HIGH (ref 0.1–1.0)
Monocytes Relative: 12 %
Neutro Abs: 7.3 10*3/uL (ref 1.7–7.7)
Neutrophils Relative %: 75 %
Platelets: 167 10*3/uL (ref 150–400)
RBC: 4.14 MIL/uL — ABNORMAL LOW (ref 4.22–5.81)
RDW: 11.9 % (ref 11.5–15.5)
WBC: 9.7 10*3/uL (ref 4.0–10.5)
nRBC: 0 % (ref 0.0–0.2)

## 2020-06-13 LAB — COMPREHENSIVE METABOLIC PANEL
ALT: 12 U/L (ref 0–44)
AST: 16 U/L (ref 15–41)
Albumin: 3.4 g/dL — ABNORMAL LOW (ref 3.5–5.0)
Alkaline Phosphatase: 63 U/L (ref 38–126)
Anion gap: 8 (ref 5–15)
BUN: 21 mg/dL — ABNORMAL HIGH (ref 6–20)
CO2: 24 mmol/L (ref 22–32)
Calcium: 8.6 mg/dL — ABNORMAL LOW (ref 8.9–10.3)
Chloride: 98 mmol/L (ref 98–111)
Creatinine, Ser: 1.27 mg/dL — ABNORMAL HIGH (ref 0.61–1.24)
GFR, Estimated: >60 mL/min (ref >60)
Glucose, Bld: 89 mg/dL (ref 70–99)
Potassium: 4.1 mmol/L (ref 3.5–5.1)
Sodium: 130 mmol/L — ABNORMAL LOW (ref 135–145)
Total Bilirubin: 1.4 mg/dL — ABNORMAL HIGH (ref 0.3–1.2)
Total Protein: 7.7 g/dL (ref 6.5–8.1)

## 2020-06-13 LAB — RESP PANEL BY RT-PCR (FLU A&B, COVID) ARPGX2
Influenza A by PCR: NEGATIVE
Influenza B by PCR: NEGATIVE
SARS Coronavirus 2 by RT PCR: POSITIVE — AB

## 2020-06-13 LAB — URINALYSIS, COMPLETE (UACMP) WITH MICROSCOPIC
Bacteria, UA: NONE SEEN
Bilirubin Urine: NEGATIVE
Glucose, UA: NEGATIVE mg/dL
Ketones, ur: NEGATIVE mg/dL
Leukocytes,Ua: NEGATIVE
Nitrite: NEGATIVE
Protein, ur: NEGATIVE mg/dL
Specific Gravity, Urine: 1.009 (ref 1.005–1.030)
Squamous Epithelial / HPF: NONE SEEN (ref 0–5)
WBC, UA: NONE SEEN WBC/hpf (ref 0–5)
pH: 5 (ref 5.0–8.0)

## 2020-06-13 LAB — CBG MONITORING, ED: Glucose-Capillary: 104 mg/dL — ABNORMAL HIGH (ref 70–99)

## 2020-06-13 LAB — LACTIC ACID, PLASMA: Lactic Acid, Venous: 1.4 mmol/L (ref 0.5–1.9)

## 2020-06-13 MED ORDER — ASCORBIC ACID 500 MG PO TABS
500.0000 mg | ORAL_TABLET | Freq: Every day | ORAL | Status: DC
  Administered 2020-06-14 – 2020-06-16 (×3): 500 mg via ORAL
  Filled 2020-06-13 (×3): qty 1

## 2020-06-13 MED ORDER — ONDANSETRON HCL 4 MG PO TABS: 4.0000 mg | ORAL_TABLET | Freq: Four times a day (QID) | ORAL | Status: DC | PRN

## 2020-06-13 MED ORDER — SODIUM CHLORIDE 0.9 % IV BOLUS
1000.0000 mL | Freq: Once | INTRAVENOUS | Status: AC
  Administered 2020-06-13: 1000 mL via INTRAVENOUS

## 2020-06-13 MED ORDER — SODIUM CHLORIDE 0.9 % IV SOLN
200.0000 mg | Freq: Once | INTRAVENOUS | Status: AC
  Administered 2020-06-14: 200 mg via INTRAVENOUS
  Filled 2020-06-13: qty 40

## 2020-06-13 MED ORDER — LEVOFLOXACIN 750 MG PO TABS
750.0000 mg | ORAL_TABLET | Freq: Every day | ORAL | Status: DC
  Administered 2020-06-14 – 2020-06-16 (×3): 750 mg via ORAL
  Filled 2020-06-13 (×3): qty 1

## 2020-06-13 MED ORDER — SODIUM CHLORIDE 0.9 % IV SOLN
100.0000 mg | Freq: Every day | INTRAVENOUS | Status: DC
  Administered 2020-06-14 – 2020-06-16 (×3): 100 mg via INTRAVENOUS
  Filled 2020-06-13 (×5): qty 20

## 2020-06-13 MED ORDER — INSULIN ASPART 100 UNIT/ML IJ SOLN
0.0000 [IU] | Freq: Three times a day (TID) | INTRAMUSCULAR | Status: DC
  Administered 2020-06-15 – 2020-06-16 (×2): 2 [IU] via SUBCUTANEOUS
  Filled 2020-06-13 (×2): qty 1

## 2020-06-13 MED ORDER — INSULIN ASPART 100 UNIT/ML IJ SOLN
0.0000 [IU] | Freq: Every day | INTRAMUSCULAR | Status: DC
Start: 2020-06-13 — End: 2020-06-16

## 2020-06-13 MED ORDER — SODIUM CHLORIDE 0.9 % IV SOLN
500.0000 mg | Freq: Every day | INTRAVENOUS | Status: DC
  Administered 2020-06-14 – 2020-06-15 (×2): 500 mg via INTRAVENOUS
  Filled 2020-06-13 (×3): qty 10

## 2020-06-13 MED ORDER — ACETAMINOPHEN 325 MG PO TABS
650.0000 mg | ORAL_TABLET | Freq: Four times a day (QID) | ORAL | Status: DC | PRN
  Administered 2020-06-13 – 2020-06-14 (×2): 650 mg via ORAL
  Filled 2020-06-13 (×2): qty 2

## 2020-06-13 MED ORDER — ZINC SULFATE 220 (50 ZN) MG PO CAPS
220.0000 mg | ORAL_CAPSULE | Freq: Every day | ORAL | Status: DC
  Administered 2020-06-14 – 2020-06-16 (×3): 220 mg via ORAL
  Filled 2020-06-13 (×3): qty 1

## 2020-06-13 MED ORDER — ENOXAPARIN SODIUM 40 MG/0.4ML IJ SOSY
40.0000 mg | PREFILLED_SYRINGE | INTRAMUSCULAR | Status: DC
  Filled 2020-06-13: qty 0.4

## 2020-06-13 MED ORDER — HYDROCODONE-ACETAMINOPHEN 5-325 MG PO TABS: 1.0000 | ORAL_TABLET | ORAL | Status: DC | PRN

## 2020-06-13 MED ORDER — ONDANSETRON HCL 4 MG/2ML IJ SOLN: 4.0000 mg | Freq: Four times a day (QID) | INTRAMUSCULAR | Status: DC | PRN

## 2020-06-13 MED ORDER — LACTATED RINGERS IV BOLUS
1000.0000 mL | Freq: Once | INTRAVENOUS | Status: AC
  Administered 2020-06-13: 1000 mL via INTRAVENOUS

## 2020-06-13 NOTE — Consult Note (Signed)
Remdesivir - Pharmacy Brief Note   O:  ALT: 12 CXR: No acute cardiopulmonary abnormalities SpO2: On room air   A/P:  06/13/20 SARS-CoV-2 PCR (+)  Remdesivir 200 mg IVPB once followed by 100 mg IVPB daily x 4 days.   Tressie Ellis 06/13/2020 9:46 PM

## 2020-06-13 NOTE — ED Provider Notes (Signed)
[facility] Emergency Department Provider Note   ____________________________________________   I have reviewed the triage vital signs and the nursing notes.   HISTORY  Chief Complaint Hypotension   History limited by: Not Limited   HPI Scott Howard is a 60 y.o. male who presents to the emergency department today because of concern for hypotension. The patient states that he was seen in the emergency department yesterday for concern for low blood pressure in the setting of positive home covid test. At time of discharge the patient was feeling better and blood pressure had improved after 2 liters of IV fluids. Since discharge patient has not been doing well with keeping up with his hydration. States he has only urinated one time today. The patient however denies any significant weakness or dizziness, but when they checked his blood pressure today it was noted to be low. The patient is on lisinopril but has not taken it the past two days.   Records reviewed. Per medical record review patient has a history of DM, HTN. ER visit yesterday.  Past Medical History:  Diagnosis Date  . Diabetes mellitus without complication (HCC)   . Hyperlipidemia    borderline  . Hypertension     Patient Active Problem List   Diagnosis Date Noted  . Hypomagnesemia   . Diabetic foot ulcer (HCC)   . PVD (peripheral vascular disease) (HCC)   . Pyogenic inflammation of bone (HCC) 05/27/2020  . Dehydration 05/27/2020  . Acute prerenal azotemia 05/27/2020  . Atherosclerosis of native arteries of the extremities with ulceration (HCC) 05/26/2020  . Insomnia 05/11/2020  . Diabetic polyneuropathy associated with type 2 diabetes mellitus (HCC) 02/22/2020  . Essential hypertension 02/22/2020  . Hyperlipidemia 02/22/2020  . Type 2 diabetes mellitus with hyperlipidemia (HCC) 02/22/2020    Past Surgical History:  Procedure Laterality Date  . AMPUTATION TOE Left 05/29/2020    Procedure: 4th and 5th TOE AMPUTATION WITH PARTIAL RAY RESECTION;  Surgeon: Linus Galas, DPM;  Location: [facility] ORS;  Service: Podiatry;  Laterality: Left;  . HAND SURGERY Left   . LOWER EXTREMITY ANGIOGRAPHY Left 05/27/2020   Procedure: Lower Extremity Angiography;  Surgeon: Renford Dills, MD;  Location: [facility] INVASIVE CV LAB;  Service: Cardiovascular;  Laterality: Left;  . LOWER EXTREMITY ANGIOGRAPHY Right 05/31/2020   Procedure: Lower Extremity Angiography;  Surgeon: Renford Dills, MD;  Location: [facility] INVASIVE CV LAB;  Service: Cardiovascular;  Laterality: Right;  . TONSILLECTOMY AND ADENOIDECTOMY      Prior to Admission medications   Medication Sig Start Date End Date Taking? Authorizing Provider  aspirin EC 81 MG EC tablet Take 1 tablet (81 mg total) by mouth daily. Swallow whole. 06/02/20   Arnetha Courser, MD  clopidogrel (PLAVIX) 75 MG tablet Take 1 tablet (75 mg total) by mouth daily. 06/01/20   Stegmayer, Cala Bradford A, PA-C  daptomycin (CUBICIN) IVPB Inject 500 mg into the vein daily for 25 days. Indication: diabetic foot infection and osteomyelitis First Dose: Yes Last Day of Therapy:  06/26/2020 Labs - Once weekly:  CBC/D, CMP, CRP and CPK Method of administration: IV Push Method of administration may be changed at the discretion of home infusion pharmacist based upon assessment of the patient and/or caregiver's ability to self-administer the medication ordered. 06/01/20 06/26/20  Arnetha Courser, MD  glipiZIDE (GLUCOTROL) 5 MG tablet Take 5 mg by mouth 2 (two) times daily. 02/23/20   [provider]  levofloxacin (LEVAQUIN) 750 MG tablet Take 1 tablet (750 mg total)  by mouth daily at 2 PM for 28 doses. 06/01/20 06/29/20  Arnetha Courser, MD  lisinopril (ZESTRIL) 10 MG tablet Take 1 tablet by mouth daily. 03/23/20   [provider]  magnesium oxide (MAG-OX) 400 (240 Mg) MG tablet Take 1 tablet (400 mg total) by mouth daily. 06/02/20   Arnetha Courser, MD  nirmatrelvir/ritonavir EUA  (PAXLOVID) TABS Take 3 tablets by mouth 2 (two) times daily for 5 days. Patient GFR is 56. Take nirmatrelvir (150 mg) 2 tablet(s) twice daily for 5 days and ritonavir (100 mg) one tablet twice daily for 5 days. 06/12/20 06/17/20  Jene Every, MD  oxyCODONE (OXY IR/ROXICODONE) 5 MG immediate release tablet Take 1-2 tablets (5-10 mg total) by mouth every 4 (four) hours as needed for moderate pain. 06/01/20   Arnetha Courser, MD  pravastatin (PRAVACHOL) 20 MG tablet Take 20 mg by mouth at bedtime. 03/23/20   [provider]  SANTYL ointment Apply topically daily. 05/25/20   [provider]    Allergies Sulfa antibiotics and Silvadene [silver sulfadiazine]  Family History  Problem Relation Age of Onset  . Cancer Mother   . Cancer Father   . Hypertension Brother   . Congestive Heart Failure Brother     Social History Social History   Tobacco Use  . Smoking status: Never Smoker  . Smokeless tobacco: Never Used  Vaping Use  . Vaping Use: Never used  Substance Use Topics  . Alcohol use: Yes    Comment: ocassionally  . Drug use: Never    Review of Systems Constitutional: No fever/chills Eyes: No visual changes. ENT: Positive for congestion.  Cardiovascular: Denies chest pain. Respiratory: Denies shortness of breath. Gastrointestinal: Positive for decreased oral intake.   Genitourinary: Negative for dysuria. Musculoskeletal: Negative for back pain. Skin: Negative for rash. Neurological: Negative for headaches, focal weakness or numbness.  ____________________________________________   PHYSICAL EXAM:  VITAL SIGNS: ED Triage Vitals  Enc Vitals Group     BP 06/13/20 1617 (!) 74/56     Pulse Rate 06/13/20 1617 99     Resp 06/13/20 1617 17     Temp 06/13/20 1617 98.5 F (36.9 C)     Temp Source 06/13/20 1617 Oral     SpO2 06/13/20 1617 98 %     Weight 06/13/20 1618 150 lb (68 kg)     Height 06/13/20 1618 5\' 6"  (1.676 m)     Head Circumference --      Peak  Flow --      Pain Score 06/13/20 1618 0   Constitutional: Alert and oriented.  Eyes: Conjunctivae are normal.  ENT      Head: Normocephalic and atraumatic.      Nose: No congestion/rhinnorhea.      Mouth/Throat: Mucous membranes are moist.      Neck: No stridor. Hematological/Lymphatic/Immunilogical: No cervical lymphadenopathy. Cardiovascular: Normal rate, regular rhythm.  No murmurs, rubs, or gallops.  Respiratory: Normal respiratory effort without tachypnea nor retractions. Breath sounds are clear and equal bilaterally. No wheezes/rales/rhonchi. Gastrointestinal: Soft and non tender. No rebound. No guarding.  Genitourinary: Deferred Musculoskeletal: Normal range of motion in all extremities. No lower extremity edema. Neurologic:  Normal speech and language. No gross focal neurologic deficits are appreciated.  Skin:  Skin is warm, dry and intact. No rash noted. Psychiatric: Mood and affect are normal. Speech and behavior are normal. Patient exhibits appropriate insight and judgment.  ____________________________________________    LABS (pertinent positives/negatives)  CBC wbc 9.7, hgb 12.3, plt 167  COVID positive UA clear, moderate hgb dipstick CMP na 130, k 4.1, cr 1.27  ____________________________________________   EKG  I, Phineas Semen, attending physician, personally viewed and interpreted this EKG  EKG Time: 1641 Rate: 91 Rhythm: sinus rhythm Axis: normal Intervals: qtc 409 QRS: narrow ST changes: no st elevation Impression: normal ekg  ____________________________________________    RADIOLOGY  None  ____________________________________________   PROCEDURES  Procedures  ____________________________________________   INITIAL IMPRESSION / ASSESSMENT AND PLAN / ED COURSE  Pertinent labs & imaging results that were available during my care of the patient were reviewed by me and considered in my medical decision making (see chart for details).    Patient presented to the emergency department today because of concerns for recurrent hypotension.  Patient had test positive for COVID yesterday.  Patient was found to be hypotensive here in the emergency department.  Was given a liter of fluid and continued to be orthostatically hypotensive.  At this time given recurrent hypotension do think he would benefit from admission to the hospital service.   ____________________________________________   FINAL CLINICAL IMPRESSION(S) / ED DIAGNOSES  Final diagnoses:  Dehydration  COVID-19     Note: This dictation was prepared with Dragon dictation. Any transcriptional errors that result from this process are unintentional     Phineas Semen, MD 06/13/20 2104

## 2020-06-13 NOTE — ED Triage Notes (Signed)
Arrives with continued c/o hypotension.  Seen through ED yesterday for same.  States BP at home was 68/40.  Patient is AAOx3.  Skin awrm and dry. NAD

## 2020-06-13 NOTE — ED Triage Notes (Addendum)
Pt states his b/p has been 60/40 at home, pt had recent toe amputation and is getting IV abx thru his picc line states he was running a fever today and yesterday, 101.1, took tylenol at 1430. denies N/V/D. States he did not eat well yesterday, has home health to change the dressing.

## 2020-06-13 NOTE — Consult Note (Addendum)
Pharmacy Antibiotic Note  Scott Howard is a 60 y.o. male admitted on 06/13/2020 with COVID-19 infection.  Pharmacy has been consulted for continuation of outpatient daptomycin and levofloxacin dosing.  Patient was recently admitted 05/27/20 - 06/03/20 with OM left fourth and fifth toe and vascular disease which was treated with endovascular intervention. Patient further had amputation of fourth and fifth toe with podiatry on 05/29/20. Infectious diseases was following and recommended treatment with 4 weeks of daptomycin and levofloxacin. Last day of therapy planned for 06/26/20.   Patient reports taking levofloxacin and daptomycin prior to presentation on 06/13/20.  Plan:  Continue daptomycin 500 mg IV q24h  Continue levofloxacin 750 mg PO daily  Last day of therapy tentatively scheduled for 06/26/20  Height: 5\' 6"  (167.6 cm) Weight: 68 kg (150 lb) IBW/kg (Calculated) : 63.8  Temp (24hrs), Avg:98.7 F (37.1 C), Min:98.5 F (36.9 C), Max:98.9 F (37.2 C)  Recent Labs  Lab 06/12/20 1444 06/13/20 1627 06/13/20 1633  WBC 6.0 9.7  --   CREATININE 1.44* 1.27*  --   LATICACIDVEN 1.3  --  1.4    Estimated Creatinine Clearance: 55.8 mL/min (A) (by C-G formula based on SCr of 1.27 mg/dL (H)).    Allergies  Allergen Reactions  . Sulfa Antibiotics Rash  . Silvadene [Silver Sulfadiazine]     Antimicrobials this admission: Remdesivir 5/16 >>  Daptomycin 5/17 >> (5/29) Levofloxacin 5/17 >> (5/29)  Dose adjustments this admission: N/A  Microbiology results: 5/16 BCx: pending 5/16 SARS-CoV-2 PCR: (+)  Thank you for allowing pharmacy to be a part of this patient's care.  6/16 06/13/2020 10:25 PM

## 2020-06-13 NOTE — H&P (Signed)
History and Physical    Scott Howard PTW:[id number] DOB: [birth year] DOA: 06/13/2020  PCP: Mick Sell, MD   Patient coming from: home  I have personally briefly reviewed patient's old medical records in [facility]  Chief Complaint: weakness, hypotension, covid positive  HPI: Scott Howard is a 60 y.o. 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  He was hydrated and discharged from the ER on 5/15 but returns today with a complaint of hypotension with blood pressure 68/40 at home.  States because of his low blood pressure he did not take his home BP meds today.  He denies cough or shortness of breath, denies nausea, vomiting or abdominal pain.  Denies change in bowel habits or dysuria. ED course: On arrival BP 74/56 with pulse of 99.  Afebrile and O2 sat 98% on room air and patient was mentating well.  Blood work with creatinine of 1.27 up from baseline of 0.8, normal WBC of 9.7, normal lactic acid of 1.4.  Sodium 130.  Hemoglobin 12.3 which is his baseline. EKG, personally viewed and interpreted: Sinus rhythm at 91 with no acute ST T wave changes Imaging: Chest x-ray with no acute abnormality  Patient given 2 L IV fluid bolus in the emergency room with improvement in blood pressure to 135/73.  Hospitalist consulted for admission.   Review of Systems: As per HPI otherwise all other systems on review of systems negative.    Past Medical History:  Diagnosis Date  . Diabetes mellitus without complication (HCC)   . Hyperlipidemia    borderline  . Hypertension      Past Surgical History:  Procedure Laterality Date  . AMPUTATION TOE Left 05/29/2020   Procedure: 4th and 5th TOE AMPUTATION WITH PARTIAL RAY RESECTION;  Surgeon: Linus Galas, DPM;  Location: [facility] ORS;  Service: Podiatry;  Laterality: Left;  . HAND SURGERY Left   . LOWER EXTREMITY ANGIOGRAPHY Left 05/27/2020   Procedure: Lower Extremity Angiography;  Surgeon: Renford Dills, MD;  Location: [facility] INVASIVE CV LAB;  Service: Cardiovascular;  Laterality: Left;  . LOWER EXTREMITY ANGIOGRAPHY Right 05/31/2020   Procedure: Lower Extremity Angiography;  Surgeon: Renford Dills, MD;  Location: [facility] INVASIVE CV LAB;  Service: Cardiovascular;  Laterality: Right;  . TONSILLECTOMY AND ADENOIDECTOMY       reports that he has never smoked. He has never used smokeless tobacco. He reports current alcohol use. He reports that he does not use drugs.  Allergies  Allergen Reactions  . Sulfa Antibiotics Rash  . Silvadene [Silver Sulfadiazine]     Family History  Problem Relation Age of Onset  . Cancer Mother   . Cancer Father   . Hypertension Brother   . Congestive Heart Failure Brother       Prior to Admission medications   Medication Sig Start Date End Date Taking? Authorizing Provider  aspirin EC 81 MG EC tablet Take 1 tablet (81 mg total) by mouth daily. Swallow whole. 06/02/20   Arnetha Courser, MD  clopidogrel (PLAVIX) 75 MG tablet Take 1 tablet (75 mg total) by  mouth daily. 06/01/20   Stegmayer, Cala Bradford A, PA-C  daptomycin (CUBICIN) IVPB Inject 500 mg into the vein daily for 25 days. Indication: diabetic foot infection and osteomyelitis First Dose: Yes Last Day of Therapy:  06/26/2020 Labs - Once weekly:  CBC/D, CMP, CRP and CPK Method of administration: IV Push Method of administration may be changed at the discretion of home infusion pharmacist based upon assessment of the patient and/or caregiver's ability to self-administer the medication ordered. 06/01/20 06/26/20  Arnetha Courser, MD   glipiZIDE (GLUCOTROL) 5 MG tablet Take 5 mg by mouth 2 (two) times daily. 02/23/20   [provider]  levofloxacin (LEVAQUIN) 750 MG tablet Take 1 tablet (750 mg total) by mouth daily at 2 PM for 28 doses. 06/01/20 06/29/20  Arnetha Courser, MD  lisinopril (ZESTRIL) 10 MG tablet Take 1 tablet by mouth daily. 03/23/20   [provider]  magnesium oxide (MAG-OX) 400 (240 Mg) MG tablet Take 1 tablet (400 mg total) by mouth daily. 06/02/20   Arnetha Courser, MD  nirmatrelvir/ritonavir EUA (PAXLOVID) TABS Take 3 tablets by mouth 2 (two) times daily for 5 days. Patient GFR is 56. Take nirmatrelvir (150 mg) 2 tablet(s) twice daily for 5 days and ritonavir (100 mg) one tablet twice daily for 5 days. 06/12/20 06/17/20  Jene Every, MD  oxyCODONE (OXY IR/ROXICODONE) 5 MG immediate release tablet Take 1-2 tablets (5-10 mg total) by mouth every 4 (four) hours as needed for moderate pain. 06/01/20   Arnetha Courser, MD  pravastatin (PRAVACHOL) 20 MG tablet Take 20 mg by mouth at bedtime. 03/23/20   [provider]  SANTYL ointment Apply topically daily. 05/25/20   [provider]    Physical Exam: Vitals:   06/13/20 1829 06/13/20 1830 06/13/20 1845 06/13/20 2030  BP: 92/61 (!) 73/48 (!) 88/57 130/64  Pulse: 85 92 79 87  Resp:   (!) 31 18  Temp:      TempSrc:      SpO2:   98% 98%  Weight:      Height:         Vitals:   06/13/20 1829 06/13/20 1830 06/13/20 1845 06/13/20 2030  BP: 92/61 (!) 73/48 (!) 88/57 130/64  Pulse: 85 92 79 87  Resp:   (!) 31 18  Temp:      TempSrc:      SpO2:   98% 98%  Weight:      Height:          Constitutional: Alert and oriented x 3 . Not in any apparent distress HEENT:      Head: Normocephalic and atraumatic.         Eyes: PERLA, EOMI, Conjunctivae are normal. Sclera is non-icteric.       Mouth/Throat: Mucous membranes are moist.       Neck: Supple with no signs of meningismus. Cardiovascular: Regular rate and rhythm. No murmurs, gallops,  or rubs. 2+ symmetrical distal pulses are present . No JVD. No LE edema Respiratory: Respiratory effort normal .Lungs sounds clear bilaterally. No wheezes, crackles, or rhonchi.  Gastrointestinal: Soft, non tender, and non distended with positive bowel sounds.  Genitourinary: No CVA tenderness. Musculoskeletal: Nontender with normal range of motion in all extremities. No cyanosis, or erythema of extremities.See skin exam.  fourth and fifth digit amputation left foot Neurologic:  Face is symmetric. Moving all extremities. No gross focal neurologic deficits . Skin: Skin is warm, dry.Multiple ulcers Right plantar surface base of first second and third metatarsals as  well as fifth, healing surgical wound left lateral foot from amputation Psychiatric: Mood and affect are normal    Labs on Admission: I have personally reviewed following labs and imaging studies  CBC: Recent Labs  Lab 06/12/20 1444 06/13/20 1627  WBC 6.0 9.7  NEUTROABS 4.5 7.3  HGB 12.8* 12.3*  HCT 37.4* 34.7*  MCV 85.2 83.8  PLT 192 167   Basic Metabolic Panel: Recent Labs  Lab 06/12/20 1444 06/13/20 1627  NA 131* 130*  K 4.6 4.1  CL 98 98  CO2 25 24  GLUCOSE 132* 89  BUN 23* 21*  CREATININE 1.44* 1.27*  CALCIUM 9.0 8.6*   GFR: Estimated Creatinine Clearance: 55.8 mL/min (A) (by C-G formula based on SCr of 1.27 mg/dL (H)). Liver Function Tests: Recent Labs  Lab 06/12/20 1444 06/13/20 1627  AST 14* 16  ALT 14 12  ALKPHOS 73 63  BILITOT 1.2 1.4*  PROT 7.9 7.7  ALBUMIN 3.5 3.4*   No results for input(s): LIPASE, AMYLASE in the last 168 hours. No results for input(s): AMMONIA in the last 168 hours. Coagulation Profile: No results for input(s): INR, PROTIME in the last 168 hours. Cardiac Enzymes: No results for input(s): CKTOTAL, CKMB, CKMBINDEX, TROPONINI in the last 168 hours. BNP (last 3 results) No results for input(s): PROBNP in the last 8760 hours. HbA1C: No results for input(s): HGBA1C in the  last 72 hours. CBG: No results for input(s): GLUCAP in the last 168 hours. Lipid Profile: No results for input(s): CHOL, HDL, LDLCALC, TRIG, CHOLHDL, LDLDIRECT in the last 72 hours. Thyroid Function Tests: No results for input(s): TSH, T4TOTAL, FREET4, T3FREE, THYROIDAB in the last 72 hours. Anemia Panel: No results for input(s): VITAMINB12, FOLATE, FERRITIN, TIBC, IRON, RETICCTPCT in the last 72 hours. Urine analysis:    Component Value Date/Time   COLORURINE YELLOW (A) 06/13/2020 1633   APPEARANCEUR CLEAR (A) 06/13/2020 1633   LABSPEC 1.009 06/13/2020 1633   PHURINE 5.0 06/13/2020 1633   GLUCOSEU NEGATIVE 06/13/2020 1633   HGBUR MODERATE (A) 06/13/2020 1633   BILIRUBINUR NEGATIVE 06/13/2020 1633   KETONESUR NEGATIVE 06/13/2020 1633   PROTEINUR NEGATIVE 06/13/2020 1633   NITRITE NEGATIVE 06/13/2020 1633   LEUKOCYTESUR NEGATIVE 06/13/2020 1633    Radiological Exams on Admission: DG Chest 2 View  Result Date: 06/12/2020 CLINICAL DATA:  Possible COVID EXAM: CHEST - 2 VIEW COMPARISON:  None. FINDINGS: The heart size and mediastinal contours are within normal limits. Left upper extremity PICC. Both lungs are clear. The visualized skeletal structures are unremarkable. IMPRESSION: 1. No acute abnormality of the lungs. No focal airspace opacity. 2. Left upper extremity PICC. Electronically Signed   By: Lauralyn PrimesAlex  Bibbey M.D.   On: 06/12/2020 15:19   DG Chest Port 1 View  Result Date: 06/13/2020 CLINICAL DATA:  Sepsis.  Hypotension. EXAM: PORTABLE CHEST 1 VIEW COMPARISON:  06/12/2020 FINDINGS: Left upper extremity PICC line tip is at the cavoatrial junction. Normal heart size. No pleural effusion or edema. No airspace densities identified. IMPRESSION: No acute cardiopulmonary abnormalities. Electronically Signed   By: Signa Kellaylor  Stroud M.D.   On: 06/13/2020 17:15     Assessment/Plan 60 year old male with history of DM, HTN with hospitalized from 4/29- 5/6 recently for osteomyelitis of the left  foot with amputation of the left fourth and fifth toe currently on a PICC line receiving IV daptomycin until 5/29 on oral Levaquin until 5/29 who presents to the emergency room for the second time in 24 hours with complaints of fever, fatigue,  muscle aches, decreased oral intake and dizziness and positive home COVID test. with blood pressure 68/40 at home.      Hypotension, possible sepsis   Generalized weakness   AKI (acute kidney injury) (HCC)   COVID-19 virus infection - Presenting with generalized malaise, poor oral intake x 2 days, known COVID-positive with creatinine 1.27 up from baseline of 0.8 - Patient has fever, hypotension b normal WBC and lactic acid, so possible sepsis - Fluid responsive hypotension with BP 74/56 on arrival improving to 135/73 with 2 L fluid bolus.  He was orthostatic in ER - Patient has no respiratory symptoms for COVID so we will continue to hydrate.  Chest x-ray was clear - Remdesivir if patient agreeable, although therapeutic benefit uncertain, vitamins - Supportive care -follow blood cultures, procalcitonin    Essential hypertension - Holding oral antihypertensives    Type 2 diabetes mellitus with hyperlipidemia (HCC) - Sliding scale insulin    Atherosclerosis of native arteries of the extremities  S/p amputation left fourth and fifth toes on 5/1 - Wound care - Consider consulting podiatry/ID in the a.m. - Continue daptomycin through PICC line until 5/29 - Continue oral Levaquin to complete 28 days - Pharmacy consult for resumption of daptomycin and Levaquin per prior dosing    DVT prophylaxis: Lovenox  Code Status: full code  Family Communication:  none  Disposition Plan: Back to previous home environment Consults called: none  Status:observation    Andris Baumann MD [facility]     06/13/2020, 9:14 PM

## 2020-06-14 DIAGNOSIS — L97419 Non-pressure chronic ulcer of right heel and midfoot with unspecified severity: Secondary | ICD-10-CM | POA: Diagnosis present

## 2020-06-14 DIAGNOSIS — E861 Hypovolemia: Secondary | ICD-10-CM | POA: Diagnosis not present

## 2020-06-14 DIAGNOSIS — I7025 Atherosclerosis of native arteries of other extremities with ulceration: Secondary | ICD-10-CM | POA: Diagnosis not present

## 2020-06-14 DIAGNOSIS — L97429 Non-pressure chronic ulcer of left heel and midfoot with unspecified severity: Secondary | ICD-10-CM | POA: Diagnosis present

## 2020-06-14 DIAGNOSIS — Z7984 Long term (current) use of oral hypoglycemic drugs: Secondary | ICD-10-CM | POA: Diagnosis not present

## 2020-06-14 DIAGNOSIS — N179 Acute kidney failure, unspecified: Secondary | ICD-10-CM

## 2020-06-14 DIAGNOSIS — E1151 Type 2 diabetes mellitus with diabetic peripheral angiopathy without gangrene: Secondary | ICD-10-CM | POA: Diagnosis present

## 2020-06-14 DIAGNOSIS — Z888 Allergy status to other drugs, medicaments and biological substances status: Secondary | ICD-10-CM | POA: Diagnosis not present

## 2020-06-14 DIAGNOSIS — E1142 Type 2 diabetes mellitus with diabetic polyneuropathy: Secondary | ICD-10-CM | POA: Diagnosis present

## 2020-06-14 DIAGNOSIS — Z79899 Other long term (current) drug therapy: Secondary | ICD-10-CM | POA: Diagnosis not present

## 2020-06-14 DIAGNOSIS — Z882 Allergy status to sulfonamides status: Secondary | ICD-10-CM | POA: Diagnosis not present

## 2020-06-14 DIAGNOSIS — J1282 Pneumonia due to coronavirus disease 2019: Secondary | ICD-10-CM | POA: Diagnosis present

## 2020-06-14 DIAGNOSIS — E785 Hyperlipidemia, unspecified: Secondary | ICD-10-CM | POA: Diagnosis present

## 2020-06-14 DIAGNOSIS — Z7902 Long term (current) use of antithrombotics/antiplatelets: Secondary | ICD-10-CM | POA: Diagnosis not present

## 2020-06-14 DIAGNOSIS — I9589 Other hypotension: Secondary | ICD-10-CM | POA: Diagnosis not present

## 2020-06-14 DIAGNOSIS — I10 Essential (primary) hypertension: Secondary | ICD-10-CM

## 2020-06-14 DIAGNOSIS — E11621 Type 2 diabetes mellitus with foot ulcer: Secondary | ICD-10-CM | POA: Diagnosis present

## 2020-06-14 DIAGNOSIS — E860 Dehydration: Secondary | ICD-10-CM | POA: Diagnosis present

## 2020-06-14 DIAGNOSIS — Z7982 Long term (current) use of aspirin: Secondary | ICD-10-CM | POA: Diagnosis not present

## 2020-06-14 DIAGNOSIS — J9601 Acute respiratory failure with hypoxia: Secondary | ICD-10-CM | POA: Diagnosis present

## 2020-06-14 DIAGNOSIS — U071 COVID-19: Principal | ICD-10-CM

## 2020-06-14 DIAGNOSIS — Z89422 Acquired absence of other left toe(s): Secondary | ICD-10-CM | POA: Diagnosis not present

## 2020-06-14 DIAGNOSIS — I70202 Unspecified atherosclerosis of native arteries of extremities, left leg: Secondary | ICD-10-CM | POA: Diagnosis present

## 2020-06-14 DIAGNOSIS — E1169 Type 2 diabetes mellitus with other specified complication: Secondary | ICD-10-CM | POA: Diagnosis present

## 2020-06-14 DIAGNOSIS — I959 Hypotension, unspecified: Secondary | ICD-10-CM | POA: Diagnosis present

## 2020-06-14 LAB — CBC
HCT: 33.5 % — ABNORMAL LOW (ref 39.0–52.0)
Hemoglobin: 11.5 g/dL — ABNORMAL LOW (ref 13.0–17.0)
MCH: 29.2 pg (ref 26.0–34.0)
MCHC: 34.3 g/dL (ref 30.0–36.0)
MCV: 85 fL (ref 80.0–100.0)
Platelets: 139 10*3/uL — ABNORMAL LOW (ref 150–400)
RBC: 3.94 MIL/uL — ABNORMAL LOW (ref 4.22–5.81)
RDW: 12 % (ref 11.5–15.5)
WBC: 8.1 10*3/uL (ref 4.0–10.5)
nRBC: 0 % (ref 0.0–0.2)

## 2020-06-14 LAB — GLUCOSE, CAPILLARY
Glucose-Capillary: 94 mg/dL (ref 70–99)
Glucose-Capillary: 110 mg/dL — ABNORMAL HIGH (ref 70–99)
Glucose-Capillary: 104 mg/dL — ABNORMAL HIGH (ref 70–99)
Glucose-Capillary: 121 mg/dL — ABNORMAL HIGH (ref 70–99)

## 2020-06-14 LAB — LACTIC ACID, PLASMA: Lactic Acid, Venous: 0.8 mmol/L (ref 0.5–1.9)

## 2020-06-14 LAB — CK: Total CK: 71 U/L (ref 49–397)

## 2020-06-14 LAB — PROCALCITONIN: Procalcitonin: <0.1 ng/mL

## 2020-06-14 LAB — CREATININE, SERUM
Creatinine, Ser: 1.01 mg/dL (ref 0.61–1.24)
GFR, Estimated: >60 mL/min (ref >60)

## 2020-06-14 LAB — D-DIMER, QUANTITATIVE: D-Dimer, Quant: 1.08 ug/mL-FEU — ABNORMAL HIGH (ref 0.00–0.50)

## 2020-06-14 LAB — C-REACTIVE PROTEIN: CRP: 15.1 mg/dL — ABNORMAL HIGH (ref <1.0)

## 2020-06-14 MED ORDER — ENSURE ENLIVE PO LIQD
237.0000 mL | Freq: Three times a day (TID) | ORAL | Status: DC
  Administered 2020-06-15 – 2020-06-16 (×3): 237 mL via ORAL

## 2020-06-14 MED ORDER — ADULT MULTIVITAMIN W/MINERALS CH
1.0000 | ORAL_TABLET | Freq: Every day | ORAL | Status: DC
  Administered 2020-06-15 – 2020-06-16 (×2): 1 via ORAL
  Filled 2020-06-14 (×2): qty 1

## 2020-06-14 MED ORDER — CHLORHEXIDINE GLUCONATE CLOTH 2 % EX PADS
6.0000 | MEDICATED_PAD | Freq: Every day | CUTANEOUS | Status: DC
  Administered 2020-06-14 – 2020-06-16 (×3): 6 via TOPICAL

## 2020-06-14 MED ORDER — COLLAGENASE 250 UNIT/GM EX OINT
TOPICAL_OINTMENT | CUTANEOUS | Status: DC
  Filled 2020-06-14: qty 30

## 2020-06-14 NOTE — Consult Note (Signed)
Infectious Disease     Reason for Consult:Fever, covid    Referring Physician: Dr Kurtis Bushman Date of Admission:  06/13/2020   Principal Problem:   Hypotension Active Problems:   Essential hypertension   Type 2 diabetes mellitus with hyperlipidemia ([place])   Atherosclerosis of native arteries of the extremities with ulceration ([place])   AKI (acute kidney injury) ([place])   COVID-19 virus infection   HPI: Scott Howard is a 60 y.o. male admitted with fevers, covid while being treated with IV abx for osteomyelitis . He was dced on 5/4 with PICC line and on IV daptomycin and levofloxacin.  Admitted with fever fatigue, muscle aches, decreased oral intake x 2 days and dizziness and positive home COVID test.  He was hydrated and discharged from the ER on 5/15 but returns today with a complaint of hypotension with blood pressure 68/40 at home.  States because of his low blood pressure he did not take his home BP meds today.  He denies cough or shortness of breath, denies nausea, vomiting or abdominal pain.  Denies change in bowel habits or dysuria. ED course: On arrival BP 74/56 with pulse of 99.  Afebrile and O2 sat 98% on room air and patient was mentating well.  Blood work with creatinine of 1.27 up from baseline of 0.8, normal WBC of 9.7, normal lactic acid of 1.4.  Sodium 130.  Hemoglobin 12.3 which is his baseline.   He has a  history of type 2 diabetes with peripheral neuropathy, hypertension, hyperlipidemia as well as diabetic foot infection.  He was initially admitted April 29 when he was found to have osteomyelitis of the left fifth toe.  He had initially developed his foot ulcer 7 weeks ago after walking on a hot [place] in [place].  He had been following with podiatry as an outpatient.  He was seen and had x-rays suggestive of osteomyelitis of the left fifth toe.  He was started on doxycycline at that time.  He was seen by vascular surgery  underwent ABI testing.  During that initial admission he had  MRI done that did show severe bone marrow edema throughout the fifth metatarsal fifth proximal phalanx and fifth fifth distal phalanx most concerning for osteomyelitis.  There is also bone marrow edema in the fourth proximal phalanx fourth middle phalanx and possibly fourth distal phalanx.  There was some mild bone marrow edema in the fourth metatarsal and third metatarsal as well.  The first metatarsal also had some edema.Underwent angiography and had thrombectomy of the left distal anterior tibial and dorsalis pedis.  He had angioplasty as well.  On May 1 he underwent amputation of the left fifth toe with partial fifth ray and amputation of the left fourth toe with partial fourth ray.  He also had excisional debridement of the left forefoot.  Cultures of the bone are were negative .  Blood cultures were negative but  he had outpatient wound culture done March 11 that grew Enterobacter cloacae.  This was sensitive orally to ciprofloxacin and levofloxacin Bactrim and doxycycline.   Past Medical History:  Diagnosis Date  . Diabetes mellitus without complication ([place])   . Hyperlipidemia    borderline  . Hypertension    Past Surgical History:  Procedure Laterality Date  . AMPUTATION TOE Left 05/29/2020   Procedure: 4th and 5th TOE AMPUTATION WITH PARTIAL RAY RESECTION;  Surgeon: [name], DPM;  Location: [facility] OR[facility];  Service: Podiatry;  Laterality: Left;  . HAND SURGERY Left   .  LOWER EXTREMITY ANGIOGRAPHY Left 05/27/2020   Procedure: Lower Extremity Angiography;  Surgeon: Katha Cabal, MD;  Location: [facility];  Service: Cardiovascular;  Laterality: Left;  . LOWER EXTREMITY ANGIOGRAPHY Right 05/31/2020   Procedure: Lower Extremity Angiography;  Surgeon: Katha Cabal, MD;  Location: [facility];  Service: Cardiovascular;  Laterality: Right;  . TONSILLECTOMY AND ADENOIDECTOMY     Social History   Tobacco Use  . Smoking status: Never Smoker  . Smokeless tobacco: Never  Used  Vaping Use  . Vaping Use: Never used  Substance Use Topics  . Alcohol use: Yes    Comment: ocassionally  . Drug use: Never   Family History  Problem Relation Age of Onset  . Cancer Mother   . Cancer Father   . Hypertension Brother   . Congestive Heart Failure Brother     Allergies:  Allergies  Allergen Reactions  . Sulfa Antibiotics Rash  . Silvadene [Silver Sulfadiazine]     Current antibiotics: Antibiotics Given (last 72 hours)    Date/Time Action Medication Dose Rate   06/14/20 0014 New Bag/Given   remdesivir 200 mg in sodium chloride 0.9% 250 mL IVPB 200 mg 580 mL/hr      MEDICATIONS: . vitamin C  500 mg Oral Daily  . Chlorhexidine Gluconate Cloth  6 each Topical Daily  . enoxaparin (LOVENOX) injection  40 mg Subcutaneous Q24H  . insulin aspart  0-15 Units Subcutaneous TID WC  . insulin aspart  0-5 Units Subcutaneous QHS  . levofloxacin  750 mg Oral Daily  . zinc sulfate  220 mg Oral Daily    Review of Systems - 11 systems reviewed and negative per HPI   OBJECTIVE: Temp:  [98.2 F (36.8 C)-100.8 F (38.2 C)] 98.6 F (37 C) (05/17 0835) Pulse Rate:  [77-101] 90 (05/17 0835) Resp:  [16-33] 16 (05/17 0835) BP: (73-149)/(48-76) 149/76 (05/17 0835) SpO2:  [96 %-98 %] 98 % (05/17 0835) Weight:  [68 kg-71.8 kg] 71.8 kg (05/17 0102) Physical Exam  Constitutional: He is oriented to person, place, and time. He appears well-developed and well-nourished. No distress.  HENT: anicteric Mouth/Throat: Oropharynx is clear and moist. No oropharyngeal exudate.  Cardiovascular: Normal rate, regular rhythm and normal heart sounds.  PICC line UE with no redness pain Pulmonary/Chest: Effort normal and breath sounds normal. No respiratory distress. He has no wheezes.  Abdominal: Soft. Bowel sounds are normal. He exhibits no distension. There is no tenderness.  Lymphadenopathy: He has no cervical adenopathy.  Neurological: He is alert and oriented to person, place, and  time.  Skin: see photo Psychiatric: He has a normal mood and affect. His behavior is normal.  Ext foot  Examined - well coapted incision line, no tenderness, draiange or erythema       LABS: Results for orders placed or performed during the hospital encounter of 06/13/20 (from the past 48 hour(s))  Comprehensive metabolic panel     Status: Abnormal   Collection Time: 06/13/20  4:27 PM  Result Value Ref Range   Sodium 130 (L) 135 - 145 mmol/L   Potassium 4.1 3.5 - 5.1 mmol/L   Chloride 98 98 - 111 mmol/L   CO2 24 22 - 32 mmol/L   Glucose, Bld 89 70 - 99 mg/dL    Comment: Glucose reference range applies only to samples taken after fasting for at least 8 hours.   BUN 21 (H) 6 - 20 mg/dL   Creatinine, Ser 1.27 (H) 0.61 -  1.24 mg/dL   Calcium 8.6 (L) 8.9 - 10.3 mg/dL   Total Protein 7.7 6.5 - 8.1 g/dL   Albumin 3.4 (L) 3.5 - 5.0 g/dL   AST 16 15 - 41 U/L   ALT 12 0 - 44 U/L   Alkaline Phosphatase 63 38 - 126 U/L   Total Bilirubin 1.4 (H) 0.3 - 1.2 mg/dL   GFR, Estimated >60 >60 mL/min    Comment: (NOTE) Calculated using the CKD-EPI Creatinine Equation (2021)    Anion gap 8 5 - 15    Comment: Performed at [facility], [address] [place], [place] [address]  CBC WITH DIFFERENTIAL     Status: Abnormal   Collection Time: 06/13/20  4:27 PM  Result Value Ref Range   WBC 9.7 4.0 - 10.5 K/uL   RBC 4.14 (L) 4.22 - 5.81 MIL/uL   Hemoglobin 12.3 (L) 13.0 - 17.0 g/dL   HCT 34.7 (L) 39.0 - 52.0 %   MCV 83.8 80.0 - 100.0 fL   MCH 29.7 26.0 - 34.0 pg   MCHC 35.4 30.0 - 36.0 g/dL   RDW 11.9 11.5 - 15.5 %   Platelets 167 150 - 400 K/uL   nRBC 0.0 0.0 - 0.2 %   Neutrophils Relative % 75 %   Neutro Abs 7.3 1.7 - 7.7 K/uL   Lymphocytes Relative 12 %   Lymphs Abs 1.1 0.7 - 4.0 K/uL   Monocytes Relative 12 %   Monocytes Absolute 1.2 (H) 0.1 - 1.0 K/uL   Eosinophils Relative 1 %   Eosinophils Absolute 0.1 0.0 - 0.5 K/uL   Basophils Relative 0 %   Basophils Absolute 0.0  0.0 - 0.1 K/uL   Immature Granulocytes 0 %   Abs Immature Granulocytes 0.04 0.00 - 0.07 K/uL    Comment: Performed at [facility], [address] [place], [place] [address]  Lactic acid, plasma     Status: None   Collection Time: 06/13/20  4:33 PM  Result Value Ref Range   Lactic Acid, Venous 1.4 0.5 - 1.9 mmol/L    Comment: Performed at [facility], [address] [place], [place] [address]  Urinalysis, Complete w Microscopic Nasopharyngeal Swab     Status: Abnormal   Collection Time: 06/13/20  4:33 PM  Result Value Ref Range   Color, Urine YELLOW (A) YELLOW   APPearance CLEAR (A) CLEAR   Specific Gravity, Urine 1.009 1.005 - 1.030   pH 5.0 5.0 - 8.0   Glucose, UA NEGATIVE NEGATIVE mg/dL   Hgb urine dipstick MODERATE (A) NEGATIVE   Bilirubin Urine NEGATIVE NEGATIVE   Ketones, ur NEGATIVE NEGATIVE mg/dL   Protein, ur NEGATIVE NEGATIVE mg/dL   Nitrite NEGATIVE NEGATIVE   Leukocytes,Ua NEGATIVE NEGATIVE   RBC / HPF 0-5 0 - 5 RBC/hpf   WBC, UA NONE SEEN 0 - 5 WBC/hpf   Bacteria, UA NONE SEEN NONE SEEN   Squamous Epithelial / LPF NONE SEEN 0 - 5    Comment: Performed at [facility], [address] [place], [place] [address]  Resp Panel by RT-PCR (Flu A&B, Covid) Nasopharyngeal Swab     Status: Abnormal   Collection Time: 06/13/20  7:10 PM   Specimen: Nasopharyngeal Swab; Nasopharyngeal(NP) swabs in vial transport medium  Result Value Ref Range   SARS Coronavirus 2 by RT PCR POSITIVE (A) NEGATIVE    Comment: RESULT CALLED TO, READ BACK BY AND VERIFIED WITH: TIMOTHY LEWIS AT 2045 ON 06/13/20 BY SS (  NOTE) SARS-CoV-2 target nucleic acids are DETECTED.  The SARS-CoV-2 RNA is generally detectable in upper respiratory specimens during the acute phase of infection. Positive results are indicative of the presence of the identified virus, but do not rule out bacterial infection or co-infection with other pathogens not detected by the test. Clinical  correlation with patient history and other diagnostic information is necessary to determine patient infection status. The expected result is Negative.  Fact Sheet for Patients: EntrepreneurPulse.com.au  Fact Sheet for Healthcare Providers: IncredibleEmployment.be  This test is not yet approved or cleared by the [place] FDA and  has been authorized for detection and/or diagnosis of SARS-CoV-2 by FDA under an Emergency Use Authorization (EUA).  This EUA will remain in effect (meaning this test c an be used) for the duration of  the COVID-19 declaration under Section 564(b)(1) of the Act, 21 U.S.C. section 360bbb-3(b)(1), unless the authorization is terminated or revoked sooner.     Influenza A by PCR NEGATIVE NEGATIVE   Influenza B by PCR NEGATIVE NEGATIVE    Comment: (NOTE) The Xpert Xpress SARS-CoV-2/FLU/RSV plus assay is intended as an aid in the diagnosis of influenza from Nasopharyngeal swab specimens and should not be used as a sole basis for treatment. Nasal washings and aspirates are unacceptable for Xpert Xpress SARS-CoV-2/FLU/RSV testing.  Fact Sheet for Patients: EntrepreneurPulse.com.au  Fact Sheet for Healthcare Providers: IncredibleEmployment.be  This test is not yet approved or cleared by the [place] FDA and has been authorized for detection and/or diagnosis of SARS-CoV-2 by FDA under an Emergency Use Authorization (EUA). This EUA will remain in effect (meaning this test can be used) for the duration of the COVID-19 declaration under Section 564(b)(1) of the Act, 21 U.S.C. section 360bbb-3(b)(1), unless the authorization is terminated or revoked.  Performed at [facility], [address] [place], [place] [address]   CBG monitoring, ED     Status: Abnormal   Collection Time: 06/13/20 11:33 PM  Result Value Ref Range   Glucose-Capillary 104 (H) 70 - 99 mg/dL     Comment: Glucose reference range applies only to samples taken after fasting for at least 8 hours.  CK     Status: None   Collection Time: 06/14/20 12:12 AM  Result Value Ref Range   Total CK 71 49 - 397 U/L    Comment: Performed at [facility], [address] [place], [place] [address]  C-reactive protein     Status: Abnormal   Collection Time: 06/14/20 12:12 AM  Result Value Ref Range   CRP 15.1 (H) <1.0 mg/dL    Comment: Performed at [facility], [address] [place], [place] [address]  Procalcitonin - Baseline     Status: None   Collection Time: 06/14/20 12:12 AM  Result Value Ref Range   Procalcitonin <0.10 ng/mL    Comment:        Interpretation: PCT (Procalcitonin) <= 0.5 ng/mL: Systemic infection (sepsis) is not likely. Local bacterial infection is possible. (NOTE)       Sepsis PCT Algorithm           Lower Respiratory Tract                                      Infection PCT Algorithm    ----------------------------     ----------------------------         PCT < 0.25 ng/mL  PCT < 0.10 ng/mL          Strongly encourage             Strongly discourage   discontinuation of antibiotics    initiation of antibiotics    ----------------------------     -----------------------------       PCT 0.25 - 0.50 ng/mL            PCT 0.10 - 0.25 ng/mL               OR       >80% decrease in PCT            Discourage initiation of                                            antibiotics      Encourage discontinuation           of antibiotics    ----------------------------     -----------------------------         PCT >= 0.50 ng/mL              PCT 0.26 - 0.50 ng/mL               AND        <80% decrease in PCT             Encourage initiation of                                             antibiotics       Encourage continuation           of antibiotics    ----------------------------     -----------------------------        PCT >= 0.50 ng/mL                   PCT > 0.50 ng/mL               AND         increase in PCT                  Strongly encourage                                      initiation of antibiotics    Strongly encourage escalation           of antibiotics                                     -----------------------------                                           PCT <= 0.25 ng/mL                                                 OR                                        >  80% decrease in PCT                                      Discontinue / Do not initiate                                             antibiotics  Performed at [facility], [address] [place], [place] [address]   CBC     Status: Abnormal   Collection Time: 06/14/20 12:15 AM  Result Value Ref Range   WBC 8.1 4.0 - 10.5 K/uL   RBC 3.94 (L) 4.22 - 5.81 MIL/uL   Hemoglobin 11.5 (L) 13.0 - 17.0 g/dL   HCT 33.5 (L) 39.0 - 52.0 %   MCV 85.0 80.0 - 100.0 fL   MCH 29.2 26.0 - 34.0 pg   MCHC 34.3 30.0 - 36.0 g/dL   RDW 12.0 11.5 - 15.5 %   Platelets 139 (L) 150 - 400 K/uL   nRBC 0.0 0.0 - 0.2 %    Comment: Performed at [facility], [address] [place], [place] [address]  Creatinine, serum     Status: None   Collection Time: 06/14/20 12:15 AM  Result Value Ref Range   Creatinine, Ser 1.01 0.61 - 1.24 mg/dL   GFR, Estimated >60 >60 mL/min    Comment: (NOTE) Calculated using the CKD-EPI Creatinine Equation (2021) Performed at [facility], [address] [place], [place] [address]   D-dimer, quantitative     Status: Abnormal   Collection Time: 06/14/20 12:15 AM  Result Value Ref Range   D-Dimer, Quant 1.08 (H) 0.00 - 0.50 ug/mL-FEU    Comment: (NOTE) At the manufacturer cut-off value of 0.5 g/mL FEU, this assay has a negative predictive value of 95-100%.This assay is intended for use in conjunction with a clinical pretest probability (PTP) assessment model to exclude pulmonary embolism (PE) and deep venous  thrombosis (DVT) in outpatients suspected of PE or DVT. Results should be correlated with clinical presentation. Performed at [facility], [address] [place], [place] [address]   Lactic acid, plasma     Status: None   Collection Time: 06/14/20 12:15 AM  Result Value Ref Range   Lactic Acid, Venous 0.8 0.5 - 1.9 mmol/L    Comment: Performed at [facility], [address] [place], [place] [address]  Glucose, capillary     Status: None   Collection Time: 06/14/20  8:28 AM  Result Value Ref Range   Glucose-Capillary 94 70 - 99 mg/dL    Comment: Glucose reference range applies only to samples taken after fasting for at least 8 hours.   No components found for: ESR, C REACTIVE PROTEIN MICRO: Recent Results (from the past 720 hour(s))  Resp Panel by RT-PCR (Flu A&B, Covid) Nasopharyngeal Swab     Status: None   Collection Time: 05/27/20  1:44 PM   Specimen: Nasopharyngeal Swab; Nasopharyngeal(NP) swabs in vial transport medium  Result Value Ref Range Status   SARS Coronavirus 2 by RT PCR NEGATIVE NEGATIVE Final    Comment: (NOTE) SARS-CoV-2 target nucleic acids are NOT DETECTED.  The SARS-CoV-2 RNA is generally detectable in upper respiratory specimens during the acute phase of infection. The lowest concentration of SARS-CoV-2 viral copies this assay can  detect is 138 copies/mL. A negative result does not preclude SARS-Cov-2 infection and should not be used as the sole basis for treatment or other patient management decisions. A negative result may occur with  improper specimen collection/handling, submission of specimen other than nasopharyngeal swab, presence of viral mutation(s) within the areas targeted by this assay, and inadequate number of viral copies(<138 copies/mL). A negative result must be combined with clinical observations, patient history, and epidemiological information. The expected result is Negative.  Fact Sheet for Patients:   EntrepreneurPulse.com.au  Fact Sheet for Healthcare Providers:  IncredibleEmployment.be  This test is no t yet approved or cleared by the [place] FDA and  has been authorized for detection and/or diagnosis of SARS-CoV-2 by FDA under an Emergency Use Authorization (EUA). This EUA will remain  in effect (meaning this test can be used) for the duration of the COVID-19 declaration under Section 564(b)(1) of the Act, 21 U.S.C.section 360bbb-3(b)(1), unless the authorization is terminated  or revoked sooner.       Influenza A by PCR NEGATIVE NEGATIVE Final   Influenza B by PCR NEGATIVE NEGATIVE Final    Comment: (NOTE) The Xpert Xpress SARS-CoV-2/FLU/RSV plus assay is intended as an aid in the diagnosis of influenza from Nasopharyngeal swab specimens and should not be used as a sole basis for treatment. Nasal washings and aspirates are unacceptable for Xpert Xpress SARS-CoV-2/FLU/RSV testing.  Fact Sheet for Patients: EntrepreneurPulse.com.au  Fact Sheet for Healthcare Providers: IncredibleEmployment.be  This test is not yet approved or cleared by the [place] FDA and has been authorized for detection and/or diagnosis of SARS-CoV-2 by FDA under an Emergency Use Authorization (EUA). This EUA will remain in effect (meaning this test can be used) for the duration of the COVID-19 declaration under Section 564(b)(1) of the Act, 21 U.S.C. section 360bbb-3(b)(1), unless the authorization is terminated or revoked.  Performed at [facility], [address] [place], [place] [address]   Culture, blood (Routine X 2) w Reflex to ID Panel     Status: None   Collection Time: 05/27/20  7:25 PM   Specimen: BLOOD  Result Value Ref Range Status   Specimen Description BLOOD BLOOD RIGHT HAND  Final   Special Requests   Final    BOTTLES DRAWN AEROBIC AND ANAEROBIC Blood Culture adequate volume   Culture    Final    NO GROWTH 5 DAYS Performed at [facility], [address], [place], [place] [address]    Report Status 06/01/2020 FINAL  Final  Culture, blood (Routine X 2) w Reflex to ID Panel     Status: None   Collection Time: 05/27/20  7:25 PM   Specimen: BLOOD  Result Value Ref Range Status   Specimen Description BLOOD RIGHT ANTECUBITAL  Final   Special Requests   Final    BOTTLES DRAWN AEROBIC AND ANAEROBIC Blood Culture adequate volume   Culture   Final    NO GROWTH 5 DAYS Performed at [facility], [address] [place], [place] [address]    Report Status 06/01/2020 FINAL  Final  Aerobic/Anaerobic Culture w Gram Stain (surgical/deep wound)     Status: None   Collection Time: 05/29/20  9:09 AM   Specimen: PATH Amputaion Arm/Leg; Tissue  Result Value Ref Range Status   Specimen Description   Final    BONE LEFT FOOT BONE Performed at [facility], [address], [place], [place] [address]    Special Requests   Final  NONE Performed at [facility], [place], [place] [address]    Gram Stain NO WBC SEEN NO ORGANISMS SEEN   Final   Culture   Final    No growth aerobically or anaerobically. Performed at [facility], [place] [address], [place], [place] [address]    Report Status 06/03/2020 FINAL  Final  Resp Panel by RT-PCR (Flu A&B, Covid) Nasopharyngeal Swab     Status: Abnormal   Collection Time: 06/13/20  7:10 PM   Specimen: Nasopharyngeal Swab; Nasopharyngeal(NP) swabs in vial transport medium  Result Value Ref Range Status   SARS Coronavirus 2 by RT PCR POSITIVE (A) NEGATIVE Final    Comment: RESULT CALLED TO, READ BACK BY AND VERIFIED WITH: TIMOTHY LEWIS AT 2045 ON 06/13/20 BY SS (NOTE) SARS-CoV-2 target nucleic acids are DETECTED.  The SARS-CoV-2 RNA is generally detectable in upper respiratory specimens during the acute phase of infection. Positive results are indicative of the presence of the  identified virus, but do not rule out bacterial infection or co-infection with other pathogens not detected by the test. Clinical correlation with patient history and other diagnostic information is necessary to determine patient infection status. The expected result is Negative.  Fact Sheet for Patients: EntrepreneurPulse.com.au  Fact Sheet for Healthcare Providers: IncredibleEmployment.be  This test is not yet approved or cleared by the [place] FDA and  has been authorized for detection and/or diagnosis of SARS-CoV-2 by FDA under an Emergency Use Authorization (EUA).  This EUA will remain in effect (meaning this test c an be used) for the duration of  the COVID-19 declaration under Section 564(b)(1) of the Act, 21 U.S.C. section 360bbb-3(b)(1), unless the authorization is terminated or revoked sooner.     Influenza A by PCR NEGATIVE NEGATIVE Final   Influenza B by PCR NEGATIVE NEGATIVE Final    Comment: (NOTE) The Xpert Xpress SARS-CoV-2/FLU/RSV plus assay is intended as an aid in the diagnosis of influenza from Nasopharyngeal swab specimens and should not be used as a sole basis for treatment. Nasal washings and aspirates are unacceptable for Xpert Xpress SARS-CoV-2/FLU/RSV testing.  Fact Sheet for Patients: EntrepreneurPulse.com.au  Fact Sheet for Healthcare Providers: IncredibleEmployment.be  This test is not yet approved or cleared by the [place] FDA and has been authorized for detection and/or diagnosis of SARS-CoV-2 by FDA under an Emergency Use Authorization (EUA). This EUA will remain in effect (meaning this test can be used) for the duration of the COVID-19 declaration under Section 564(b)(1) of the Act, 21 U.S.C. section 360bbb-3(b)(1), unless the authorization is terminated or revoked.  Performed at [facility], [address] [place], [place] [address]      IMAGING: CT ABDOMEN PELVIS WO CONTRAST  Result Date: 05/31/2020 CLINICAL DATA:  Recent lower extremity angiography, suspected retroperitoneal hematoma, left osteomyelitis EXAM: CT ABDOMEN AND PELVIS WITHOUT CONTRAST TECHNIQUE: Multidetector CT imaging of the abdomen and pelvis was performed following the standard protocol without IV contrast. COMPARISON:  None. FINDINGS: Lower chest: No acute pleural or parenchymal lung disease. Unenhanced CT was performed per clinician order. Lack of IV contrast limits sensitivity and specificity, especially for evaluation of abdominal/pelvic solid viscera. Hepatobiliary: No focal liver abnormality is seen. No gallstones, gallbladder wall thickening, or biliary dilatation. Pancreas: Unremarkable. No pancreatic ductal dilatation or surrounding inflammatory changes. Spleen: Normal in size without focal abnormality. Adrenals/Urinary Tract: Excreted contrast is seen from recent angiography procedure. There is an indeterminate 1.9 x 1.8 cm hypodense lesion within the ventral  aspect right kidney, with internal calcifications. Dedicated nonemergent renal MRI is recommended for complete characterization and to evaluate for underlying renal cell carcinoma. The left kidney is unremarkable. The adrenals and bladder are normal. Stomach/Bowel: No bowel obstruction or ileus. Moderate retained stool throughout the colon. Normal appendix right lower quadrant. Vascular/Lymphatic: There is extensive atherosclerosis of the abdominal aorta and its distal branches. No pathologic adenopathy within the abdomen or pelvis. Reproductive: Prostate is unremarkable. Other: No free fluid or free intraperitoneal gas. No evidence of retroperitoneal hematoma. No abdominal wall hernia. Musculoskeletal: There is increased soft tissue density within the muscular attachments along the right side of the pubic symphysis, primarily within the adductor muscular tendinous junction. This is best seen on axial images  82 through 87. Mild asymmetric sclerosis of the ventral aspect right side pubic symphysis is noted at the insertion of the rectus sheath and adductor tendon. No bony destruction or periosteal reaction. No evidence of avulsion fracture. 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 There are no acute or destructive bony lesions. Reconstructed images demonstrate no additional findings. IMPRESSION: 1. No evidence of retroperitoneal hematoma. 2. 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 No bony destruction or periosteal reaction to suggest infection. Further evaluation with MRI may be useful. 3. Indeterminate 1.9 cm right renal lesion, which will require nonemergent dedicated renal MRI for complete characterization. 4. Moderate fecal retention. Electronically Signed   By: Randa Ngo M.D.   On: 05/31/2020 20:59   DG Chest 2 View  Result Date: 06/12/2020 CLINICAL DATA:  Possible COVID EXAM: CHEST - 2 VIEW COMPARISON:  None. FINDINGS: The heart size and mediastinal contours are within normal limits. Left upper extremity PICC. Both lungs are clear. The visualized skeletal structures are unremarkable. IMPRESSION: 1. No acute abnormality of the lungs. No focal airspace opacity. 2. Left upper extremity PICC. Electronically Signed   By: Eddie Candle M.D.   On: 06/12/2020 15:19   MR FOOT LEFT WO CONTRAST  Result Date: 05/28/2020 CLINICAL DATA:  60 year old male with significant history of diabetes with neuropathy who sustained an injury to both of his feet a couple of months ago while in [place] walking on a hot boardwalk. Possible osteomyelitis of the fourth and fifth toes on recent x-ray. EXAM: MRI OF THE LEFT FOOT WITHOUT CONTRAST TECHNIQUE: Multiplanar, multisequence MR imaging of the left foot was performed. No intravenous contrast was administered. COMPARISON:  None.  FINDINGS: Bones/Joint/Cartilage Severe bone marrow edema throughout the fifth metatarsal, fifth proximal phalanx and fifth distal phalanx most concerning for osteomyelitis. Fifth PIP joint effusion which may be reactive versus reflective of septic arthritis. Bone marrow edema in the fourth proximal phalanx, fourth middle phalanx and possibly fourth distal phalanx also concerning for osteomyelitis. Mild bone marrow edema in the shaft of the fourth metatarsal. Mild bone marrow edema at the base of the third metatarsal. Bone marrow edema in the first metatarsal head extending into the proximal shaft and at the base of the first proximal phalanx which may reflect stress reaction versus osteomyelitis. No acute fracture or dislocation. Normal alignment. No other joint effusion. Ligaments Collateral ligaments are intact.  Lisfranc ligament is intact. Muscles and Tendons Flexor, peroneal and extensor compartment tendons are intact. Generalized edema throughout the plantar musculature which may be reactive versus secondary to myositis. Soft tissue No fluid collection or hematoma. No soft tissue mass. Generalized soft tissue edema along  the dorsal aspect of the foot. IMPRESSION: 1. Severe bone marrow edema throughout the fifth metatarsal, fifth proximal phalanx and fifth distal phalanx most concerning for osteomyelitis. Fifth PIP joint effusion which may be reactive versus reflective of septic arthritis. 2. Bone marrow edema in the fourth proximal phalanx, fourth middle phalanx and possibly fourth distal phalanx also concerning for osteomyelitis versus stress reaction. 3. Mild bone marrow edema in the shaft of the fourth metatarsal. Mild bone marrow edema at the base of the third metatarsal. The changes may reflect mild stress reaction versus early osteomyelitis. 4. Bone marrow edema in the first metatarsal head extending into the proximal shaft and at the base of the first proximal phalanx which may reflect stress reaction  versus osteomyelitis. Electronically Signed   By: Kathreen Devoid   On: 05/28/2020 08:04   PERIPHERAL VASCULAR CATHETERIZATION  Result Date: 05/31/2020 See Op Note  PERIPHERAL VASCULAR CATHETERIZATION  Result Date: 05/27/2020 See op note   DG Chest Port 1 View  Result Date: 06/13/2020 CLINICAL DATA:  Sepsis.  Hypotension. EXAM: PORTABLE CHEST 1 VIEW COMPARISON:  06/12/2020 FINDINGS: Left upper extremity PICC line tip is at the cavoatrial junction. Normal heart size. No pleural effusion or edema. No airspace densities identified. IMPRESSION: No acute cardiopulmonary abnormalities. Electronically Signed   By: Kerby Moors M.D.   On: 06/13/2020 17:15   VAS [place] ABI WITH/WO TBI  Result Date: 05/26/2020  LOWER EXTREMITY DOPPLER STUDY Patient Name:  NAHMIR ZEIDMAN  Date of Exam:   05/26/2020 Medical Rec #: [id number]       Accession #:    [id number] Date of Birth: 1960/09/09       Patient Gender: M Patient Age:   60Y Exam Location:  [facility] Procedure:      VAS [place] ABI WITH/WO TBI Referring Phys: [id number] [place] --------------------------------------------------------------------------------  Indications: Peripheral artery disease.  Performing Technologist: Almira Coaster RVS  Examination Guidelines: A complete evaluation includes at minimum, Doppler waveform signals and systolic blood pressure reading at the level of bilateral brachial, anterior tibial, and posterior tibial arteries, when vessel segments are accessible. Bilateral testing is considered an integral part of a complete examination. Photoelectric Plethysmograph (PPG) waveforms and toe systolic pressure readings are included as required and additional duplex testing as needed. Limited examinations for reoccurring indications may be performed as noted.  ABI Findings: +---------+------------------+-----+--------+--------+ Right    Rt Pressure (mmHg)IndexWaveformComment   +---------+------------------+-----+--------+--------+ Brachial 149                                     +---------+------------------+-----+--------+--------+ ATA      139               0.89 biphasic         +---------+------------------+-----+--------+--------+ PTA      193               1.23 biphasic         +---------+------------------+-----+--------+--------+ Great Toe250               1.59 Normal  [place]       +---------+------------------+-----+--------+--------+ +---------+------------------+-----+--------+-------+ Left     Lt Pressure (mmHg)IndexWaveformComment +---------+------------------+-----+--------+-------+ Brachial 157                                    +---------+------------------+-----+--------+-------+ ATA  250               1.59 biphasic        +---------+------------------+-----+--------+-------+ PTA      250               1.59 biphasic        +---------+------------------+-----+--------+-------+ Great Toe115               0.73 Normal          +---------+------------------+-----+--------+-------+ +-------+-----------+-----------+------------+------------+ ABI/TBIToday's ABIToday's TBIPrevious ABIPrevious TBI +-------+-----------+-----------+------------+------------+ Right  1.23       >1.0 [place]                             +-------+-----------+-----------+------------+------------+ Left   >1.0 [place]    .73                                 +-------+-----------+-----------+------------+------------+  Summary: Right: Resting right ankle-brachial index is within normal range. No evidence of significant right lower extremity arterial disease. The right toe-brachial index is normal. Left: Resting left ankle-brachial index indicates noncompressible left lower extremity arteries. The left toe-brachial index is normal.  *See table(s) above for measurements and observations.  Electronically signed by Hortencia Pilar MD on 05/26/2020 at  5:50:25 PM.    Final    [place] EKG SITE RITE  Result Date: 05/30/2020 If Site Rite image not attached, placement could not be confirmed due to current cardiac rhythm.   Assessment:   GLOVER CAPANO is a 60 y.o. male with DFI and osteomyelitis s/p surgery and now 2 weeks IV abx admitted with fevers, hypotension in setting of new dx COVID.  Cliniclly stable- suspect fevers from covid. His Hypotension responded to fluid boluses. Foot looks very good.   Recommendations Continue current abx. Will consider stopping IV abx at dc and removing PICC line as his foot looks very good and his path had clean margins at surgery.  Thank you very much for allowing me to participate in the care of this patient. Please call with questions.   Cheral Marker. Ola Spurr, MD

## 2020-06-14 NOTE — Progress Notes (Addendum)
PROGRESS NOTE    Scott Howard  OVZ:[id number] DOB: 1960-12-08 DOA: 06/13/2020 PCP: Mick Sell, MD    Brief Narrative:  Scott Howard is a 60 y.o. 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  He was hydrated and discharged from the ER on 5/15 but returns today with a complaint of hypotension with blood pressure 68/40 at home.  Stated because of his low blood pressure he did not take his home BP meds today.   He reported having decrease po intake /decrease hydration b/c not feeling well.   5/17-Tmax 100.8. feels tired, no sob. No cp  Consultants:   ID, Podiatry  Procedures:   Antimicrobials:   Daptomycin, Remdesivir, levofloxacin   Subjective: As above  Objective: Vitals:   06/14/20 0000 06/14/20 0024 06/14/20 0102 06/14/20 0456  BP: (!) 145/75  (!) 146/70 (!) 147/76  Pulse: (!) 101  100 87  Resp:   20 20  Temp:  99.3 F (37.4 C) (!) 100.8 F (38.2 C) 98.2 F (36.8 C)  TempSrc:   Oral Oral  SpO2: 96%  97% 98%  Weight:   71.8 kg   Height:   5\' 6"  (1.676 m)     Intake/Output Summary (Last 24 hours) at 06/14/2020 0802 Last data filed at 06/14/2020 0500 Gross per 24 hour  Intake 2290.09 ml  Output 400 ml  Net 1890.09 ml   Filed Weights   06/13/20 1618 06/14/20 0102  Weight: 68 kg 71.8 kg    Examination:  General exam: Appears calm and comfortable  Respiratory system: CTA no wheeze rales rhonchi's Cardiovascular system: S1 & S2 heard, RRR. No JVD, murmurs, rubs, gallops or clicks.  Gastrointestinal system: Abdomen is nondistended, soft and  nontender.. Normal bowel sounds heard. Central nervous system: Alert and oriented.  Grossly intact Extremities: Left foot wrapped in dressing, and right foot no edema Skin: Warm dry Psychiatry: . Mood & affect appropriate.     Data Reviewed: I have personally reviewed following labs and imaging studies  CBC: Recent Labs  Lab 06/12/20 1444 06/13/20 1627 06/14/20 0015  WBC 6.0 9.7 8.1  NEUTROABS 4.5 7.3  --   HGB 12.8* 12.3* 11.5*  HCT 37.4* 34.7* 33.5*  MCV 85.2 83.8 85.0  PLT 192 167 139*   Basic Metabolic Panel: Recent Labs  Lab 06/12/20 1444 06/13/20 1627 06/14/20 0015  NA 131* 130*  --   K 4.6 4.1  --   CL 98 98  --   CO2 25 24  --   GLUCOSE 132* 89  --   BUN 23* 21*  --   CREATININE 1.44* 1.27* 1.01  CALCIUM 9.0 8.6*  --    GFR: Estimated Creatinine Clearance: 70.2 mL/min (by C-G formula based on SCr of 1.01 mg/dL). Liver Function Tests: Recent Labs  Lab 06/12/20 1444 06/13/20 1627  AST 14* 16  ALT 14 12  ALKPHOS 73 63  BILITOT 1.2 1.4*  PROT 7.9 7.7  ALBUMIN 3.5 3.4*   No results for input(s): LIPASE, AMYLASE in the last 168 hours. No results for input(s): AMMONIA in  the last 168 hours. Coagulation Profile: No results for input(s): INR, PROTIME in the last 168 hours. Cardiac Enzymes: Recent Labs  Lab 06/14/20 0012  CKTOTAL 71   BNP (last 3 results) No results for input(s): PROBNP in the last 8760 hours. HbA1C: No results for input(s): HGBA1C in the last 72 hours. CBG: Recent Labs  Lab 06/13/20 2333  GLUCAP 104*   Lipid Profile: No results for input(s): CHOL, HDL, LDLCALC, TRIG, CHOLHDL, LDLDIRECT in the last 72 hours. Thyroid Function Tests: No results for input(s): TSH, T4TOTAL, FREET4, T3FREE, THYROIDAB in the last 72 hours. Anemia Panel: No results for input(s): VITAMINB12, FOLATE, FERRITIN, TIBC, IRON, RETICCTPCT in the last 72 hours. Sepsis Labs: Recent Labs  Lab 06/12/20 1444 06/13/20 1633 06/14/20 0015  LATICACIDVEN 1.3  1.4 0.8    Recent Results (from the past 240 hour(s))  Resp Panel by RT-PCR (Flu A&B, Covid) Nasopharyngeal Swab     Status: Abnormal   Collection Time: 06/13/20  7:10 PM   Specimen: Nasopharyngeal Swab; Nasopharyngeal(NP) swabs in vial transport medium  Result Value Ref Range Status   SARS Coronavirus 2 by RT PCR POSITIVE (A) NEGATIVE Final    Comment: RESULT CALLED TO, READ BACK BY AND VERIFIED WITH: TIMOTHY LEWIS AT 2045 ON 06/13/20 BY SS (NOTE) SARS-CoV-2 target nucleic acids are DETECTED.  The SARS-CoV-2 RNA is generally detectable in upper respiratory specimens during the acute phase of infection. Positive results are indicative of the presence of the identified virus, but do not rule out bacterial infection or co-infection with other pathogens not detected by the test. Clinical correlation with patient history and other diagnostic information is necessary to determine patient infection status. The expected result is Negative.  Fact Sheet for Patients: BloggerCourse.comhttps://www.fda.gov/media/152166/download  Fact Sheet for Healthcare Providers: SeriousBroker.ithttps://www.fda.gov/media/152162/download  This test is not yet approved or cleared by the [place] FDA and  has been authorized for detection and/or diagnosis of SARS-CoV-2 by FDA under an Emergency Use Authorization (EUA).  This EUA will remain in effect (meaning this test c an be used) for the duration of  the COVID-19 declaration under Section 564(b)(1) of the Act, 21 U.S.C. section 360bbb-3(b)(1), unless the authorization is terminated or revoked sooner.     Influenza A by PCR NEGATIVE NEGATIVE Final   Influenza B by PCR NEGATIVE NEGATIVE Final    Comment: (NOTE) The Xpert Xpress SARS-CoV-2/FLU/RSV plus assay is intended as an aid in the diagnosis of influenza from Nasopharyngeal swab specimens and should not be used as a sole basis for treatment. Nasal washings and aspirates are unacceptable for Xpert Xpress  SARS-CoV-2/FLU/RSV testing.  Fact Sheet for Patients: BloggerCourse.comhttps://www.fda.gov/media/152166/download  Fact Sheet for Healthcare Providers: SeriousBroker.ithttps://www.fda.gov/media/152162/download  This test is not yet approved or cleared by the [place] FDA and has been authorized for detection and/or diagnosis of SARS-CoV-2 by FDA under an Emergency Use Authorization (EUA). This EUA will remain in effect (meaning this test can be used) for the duration of the COVID-19 declaration under Section 564(b)(1) of the Act, 21 U.S.C. section 360bbb-3(b)(1), unless the authorization is terminated or revoked.  Performed at [facility], [address] [place], [place] [address]          Radiology Studies: DG Chest 2 View  Result Date: 06/12/2020 CLINICAL DATA:  Possible COVID EXAM: CHEST - 2 VIEW COMPARISON:  None. FINDINGS: The heart size and mediastinal contours are within normal limits. Left upper extremity PICC. Both lungs are clear. The visualized skeletal structures are unremarkable. IMPRESSION: 1.  No acute abnormality of the lungs. No focal airspace opacity. 2. Left upper extremity PICC. Electronically Signed   By: Lauralyn Primes M.D.   On: 06/12/2020 15:19   DG Chest Port 1 View  Result Date: 06/13/2020 CLINICAL DATA:  Sepsis.  Hypotension. EXAM: PORTABLE CHEST 1 VIEW COMPARISON:  06/12/2020 FINDINGS: Left upper extremity PICC line tip is at the cavoatrial junction. Normal heart size. No pleural effusion or edema. No airspace densities identified. IMPRESSION: No acute cardiopulmonary abnormalities. Electronically Signed   By: Signa Kell M.D.   On: 06/13/2020 17:15        Scheduled Meds: . vitamin C  500 mg Oral Daily  . Chlorhexidine Gluconate Cloth  6 each Topical Daily  . enoxaparin (LOVENOX) injection  40 mg Subcutaneous Q24H  . insulin aspart  0-15 Units Subcutaneous TID WC  . insulin aspart  0-5 Units Subcutaneous QHS  . levofloxacin  750 mg Oral Daily  . zinc sulfate   220 mg Oral Daily   Continuous Infusions: . DAPTOmycin (CUBICIN)  IV    . remdesivir 100 mg in NS 100 mL      Assessment & Plan:   Principal Problem:   Hypotension Active Problems:   Essential hypertension   Type 2 diabetes mellitus with hyperlipidemia (HCC)   Atherosclerosis of native arteries of the extremities with ulceration (HCC)   AKI (acute kidney injury) (HCC)   COVID-19 virus infection  60 year old male with history of DM, HTN with hospitalized from 4/29- 5/6 recently for osteomyelitis of the left foot with amputation of the left fourth and fifth toe currently on a PICC line receiving IV daptomycin until 5/29 on oral Levaquin until 5/29 who presents to the emergency room for the second time in 24 hours with complaints of fever, fatigue, muscle aches, decreased oral intake and dizziness and positive home COVID test. with blood pressure 68/40 at home.   Hypotension, possible sepsis   Generalized weakness   AKI (acute kidney injury) (HCC)   COVID-19 virus infection - Presenting with generalized malaise, poor oral intake x 2 days, known COVID-positive with creatinine 1.27 up from baseline of 0.8 - Patient has fever, hypotension b normal WBC and lactic acid, so possible sepsis - Fluid responsive hypotension with BP 74/56 on arrival improving to 135/73 with 2 L fluid bolus.  He was orthostatic in ER 5/17-cxr clear Febrile earlier this morning Started on Remdesivir, will continue Continue supportive care Check bcx Check CRP  procal <0.10 ID consulted-pending     Essential hypertension Hold antihypertensive due to low blood pressure    Type 2 diabetes mellitus with hyperlipidemia (HCC) BG stable Continue R-ISS    Atherosclerosis of native arteries of the extremities  S/p amputation left fourth and fifth toes on 5/1 Podiatry consulted due to patient being COVID-positive, podiatry spoke to patient by phone.  Recommended dressing changes every other day with Betadine  to the incision on the left foot with Santyl ointment and moistened gauze to all of the ulcerative areas on both feet with bulky bandages Will need to follow-up with podiatry next week ID consulted Continue daptomycin through PICC line until 5/29 Continue oral Levaquin to complete 28 days   DVT prophylaxis: Lovenox Code Status: Full Family Communication:   Status is: Inpatient  Remains inpatient appropriate because:Inpatient level of care appropriate due to severity of illness  Dispo: The patient is from: Home              Anticipated d/c is to: Home  Patient currently is not medically stable to d/c.   Difficult to place patient No            LOS: 0 days   Time spent: 35 minutes with more than 50% on COC    Lynn Ito, MD [facility] Pager [phone]  If 7PM-7AM, please contact night-coverage 06/14/2020, 8:02 AM

## 2020-06-14 NOTE — Progress Notes (Signed)
Patient ID: Scott Howard, male   DOB: Sep 11, 1960, 60 y.o.   MRN: [id number]  Subjective: Patient recently admitted with symptoms and diagnosis of COVID.  Recent hospitalization for osteomyelitis of his left foot with previous surgery.  Bilateral foot ulcerations.  When I spoke with the patient by phone he states that his dressings were changed yesterday and there is no drainage from the incision site or any of the ulcerative areas.  States that overall they look good.  Assessment: 1.  Diabetes with peripheral vascular disease.   #2 diabetic neuropathy.  #3 recent osteomyelitis with gangrenous changes left foot status post fourth and fifth ray resections.    Plan: Due to the above mentioned conversation the patient was not physically assessed.  I will place orders for dressing changes every other day with Betadine to the incision on the left foot with Santyl ointment and moistened gauze to all of the ulcerative areas on both feet with bulky bandaging.  Discussed with the patient that he should be fine until next week to take his sutures out as it has only been a little over 2 weeks.  We will cancel his appointment outpatient this week and plan for follow-up next week

## 2020-06-14 NOTE — Clinical Social Work Note (Signed)
Patient was set up with [facility] last admission for home IV abx until 5/29. Jeri Modena, RN with [facility] has been notified that he is here.  Charlynn Court, CSW [phone]

## 2020-06-14 NOTE — Progress Notes (Signed)
Initial Nutrition Assessment  DOCUMENTATION CODES:   Not applicable  INTERVENTION:   Ensure Enlive po TID, each supplement provides 350 kcal and 20 grams of protein  MVI po daily   Liberalize diet   NUTRITION DIAGNOSIS:   Increased nutrient needs related to catabolic illness (COVID 19, wound healing) as evidenced by estimated needs.  GOAL:   Patient will meet greater than or equal to 90% of their needs  MONITOR:   PO intake,Supplement acceptance,Labs,Weight trends,Skin,I & O's  REASON FOR ASSESSMENT:   Malnutrition Screening Tool    ASSESSMENT:   60 y.o. male with medical history significant for DM, HTN, hospitalized from 4/29-5/6 for osteomyelitis of the left foot with amputation of the left fourth and fifth toe for osteomyelitis, currently on a PICC line receiving IV daptomycin until 5/29 on oral Levaquin until 5/29 who presents to the emergency room for the second time in 24 hours with complaints of fever fatigue, muscle aches, decreased oral intake x 2 days and dizziness and positive home COVID test.  Met with pt in room today. Pt reports poor appetite and oral intake since 5/14. Pt reports that his appetite remains poor in hospital. Pt reports that he did not eat much lunch today as he did not like the food that was sent; pt did use his menu to order dinner for tonight. RD discussed with pt the importance of adequate nutrition needed to preserve lean muscle and support wound healing. Pt is willing to drink chocolate supplements in hospital. RD will add supplements and vitamins to help pt meet his estimated needs. RD will also liberalize the heart healthy portion of pt's diet as this is restrictive of protein. Per chart, pt appears weight stable pta.    Medications reviewed and include: vitamin C, lovenox, insulin, zinc, daptomycin  Labs reviewed: cbgs- 94, 110 x 24 hrs AIC 6.7(H)- 4/29  NUTRITION - FOCUSED PHYSICAL EXAM:  Flowsheet Row Most Recent Value  Orbital  Region No depletion  Upper Arm Region Moderate depletion  Thoracic and Lumbar Region No depletion  Buccal Region No depletion  Temple Region No depletion  Clavicle Bone Region No depletion  Clavicle and Acromion Bone Region No depletion  Scapular Bone Region No depletion  Dorsal Hand No depletion  Patellar Region Moderate depletion  Anterior Thigh Region Moderate depletion  Posterior Calf Region Moderate depletion  Edema (RD Assessment) None  Hair Reviewed  Eyes Reviewed  Mouth Reviewed  Skin Reviewed  Nails Reviewed     Diet Order:   Diet Order            Diet Carb Modified Fluid consistency: Thin; Room service appropriate? Yes  Diet effective now                EDUCATION NEEDS:   Education needs have been addressed  Skin:  Skin Assessment: Reviewed RN Assessment (incision L foot)  Last BM:  5/16- type 5  Height:   Ht Readings from Last 1 Encounters:  06/14/20 '5\' 6"'  (1.676 m)    Weight:   Wt Readings from Last 1 Encounters:  06/14/20 71.8 kg    Ideal Body Weight:  64.5 kg  BMI:  Body mass index is 25.55 kg/m.  Estimated Nutritional Needs:   Kcal:  1900-2200kcal/day  Protein:  95-110g/day  Fluid:  1.9-2.2L/day  Koleen Distance MS, RD, LDN Please refer to [facility] for RD and/or RD on-call/weekend/after hours pager

## 2020-06-15 DIAGNOSIS — I9589 Other hypotension: Secondary | ICD-10-CM

## 2020-06-15 DIAGNOSIS — E861 Hypovolemia: Secondary | ICD-10-CM

## 2020-06-15 LAB — GLUCOSE, CAPILLARY
Glucose-Capillary: 104 mg/dL — ABNORMAL HIGH (ref 70–99)
Glucose-Capillary: 113 mg/dL — ABNORMAL HIGH (ref 70–99)
Glucose-Capillary: 173 mg/dL — ABNORMAL HIGH (ref 70–99)
Glucose-Capillary: 127 mg/dL — ABNORMAL HIGH (ref 70–99)
Glucose-Capillary: 144 mg/dL — ABNORMAL HIGH (ref 70–99)

## 2020-06-15 LAB — PROCALCITONIN: Procalcitonin: <0.1 ng/mL

## 2020-06-15 MED ORDER — DM-GUAIFENESIN ER 30-600 MG PO TB12
1.0000 | ORAL_TABLET | Freq: Two times a day (BID) | ORAL | Status: DC
  Administered 2020-06-15 – 2020-06-16 (×3): 1 via ORAL
  Filled 2020-06-15 (×3): qty 1

## 2020-06-15 MED ORDER — BENZONATATE 100 MG PO CAPS
200.0000 mg | ORAL_CAPSULE | Freq: Three times a day (TID) | ORAL | Status: DC
  Administered 2020-06-15 – 2020-06-16 (×3): 200 mg via ORAL
  Filled 2020-06-15 (×3): qty 2

## 2020-06-15 NOTE — Progress Notes (Signed)
PROGRESS NOTE  Scott Howard  DOB: 1960/10/05  PCP: Mick Sell, MD GDJ:[id number]  DOA: 06/13/2020  LOS: 1 day  Hospital Day: 3   Chief Complaint  Patient presents with  . Hypotension    Brief narrative: Scott Howard is a 60 y.o. 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  Patient presented to the ED on 5/16 with complaint of fever, fatigue, muscle aches, decreased oral intake x 2 daysand dizziness and a positive home COVID test.  Blood pressure at home was 68/40.    Admitted under hospitalist service hypotension and COVID-19 infection See below for details  Subjective: Patient was seen and examined this morning. Pleasant middle-aged Caucasian male.  Not in distress.  Assessment/Plan: Hypotension -Blood pressure low at 68/40 at home prior to presentation. -Received IV hydration.  Blood pressure improved up to 140s. -Lisinopril on hold.  Currently on IV fluid.  Encourage oral hydration.  COVID pneumonia Acute respiratory failure with hypoxia  -Presented with malaise, fatigue, hypotension, cough -COVID test: PCR positive on presentation -Treatment: Currently on IV remdesivir -Progression: Not on supplemental oxygen but continues to have significant cough.  Unable to sleep at night because of that. -Continue Mucinex DM.  Also start on Tessalon Perles today. -Oxygen - SpO2: 97 %  -Supportive care: Vitamin C, Zinc, PRN inhalers, Tylenol, Antitussives (benzonatate/ Mucinex/Tussionex).   -Encouraged incentive spirometry, prone position, out of bed and early mobilization as much as possible -Continue airborne/contact isolation precautions for duration of 3 weeks from the day of diagnosis. -WBC and inflammatory markers trend as below. Recent Labs  Lab  06/12/20 1444 06/13/20 1627 06/13/20 1633 06/13/20 1910 06/14/20 0012 06/14/20 0015 06/15/20 0619  SARSCOV2NAA  --   --   --  POSITIVE*  --   --   --   WBC 6.0 9.7  --   --   --  8.1  --   LATICACIDVEN 1.3  --  1.4  --   --  0.8  --   PROCALCITON  --   --   --   --  <0.10  --  <0.10  DDIMER  --   --   --   --   --  1.08*  --   CRP  --   --   --   --  15.1*  --   --   ALT 14 12  --   --   --   --   --    AKI (acute kidney injury) -Creatinine was elevated to 1.44. Improving with IV fluid. Recent Labs    05/27/20 1157 05/28/20 0434 05/30/20 0714 05/31/20 0622 06/01/20 0500 06/12/20 1444 06/13/20 1627 06/14/20 0015  BUN 22* 25* 22* 16 17 23* 21*  --   CREATININE 1.06 1.09 0.80 0.96 0.84 1.44* 1.27* 1.01   Type 2 diabetes mellitus -A1c 6.7 on 4/29 -Home meds include glipizide 5 mg twice daily. -Currently on sliding scale insulin with Accu-Cheks. 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  Surgical site remains good.  Pathology had clear margins of surgery.  Noted a plan to  stop antibiotics prior to discharge.  Recommended dressing changes every other day with Betadine to the incision on the left foot with Santyl ointment and moistened gauze to all of the ulcerative areas on both feet with bulky bandages -ContinueAspirin 81 mg daily, Plavix 75 mg daily, pravastatin 20 mg at bedtime  Mobility: Encourage ambulation Code Status:   Code Status: Full Code  Nutritional status: Body mass index is 25.55 kg/m. Nutrition Problem: Increased nutrient needs Etiology: catabolic illness (COVID 19, wound healing) Signs/Symptoms: estimated needs Diet Order            Diet Carb Modified Fluid consistency: Thin; Room service appropriate? Yes  Diet effective now                  DVT prophylaxis: enoxaparin (LOVENOX) injection 40 mg Start: 06/13/20 2200   Antimicrobials:  Daptomycin, Levaquin Fluid: None Consultants: ID, podiatry Family Communication:  Not at bedside  Status is: Inpatient  Remains inpatient appropriate because: On IV antibiotics, cough uncontrolled  Dispo: The patient is from: Home              Anticipated d/c is to: Home tomorrow              Patient currently is not medically stable to d/c.   Difficult to place patient No     Infusions:  . DAPTOmycin (CUBICIN)  IV 500 mg (06/14/20 2102)  . remdesivir 100 mg in NS 100 mL 100 mg (06/15/20 0901)    Scheduled Meds: . vitamin C  500 mg Oral Daily  . benzonatate  200 mg Oral TID  . Chlorhexidine Gluconate Cloth  6 each Topical Daily  . collagenase   Topical Q M,W,F  . dextromethorphan-guaiFENesin  1 tablet Oral BID  . enoxaparin (LOVENOX) injection  40 mg Subcutaneous Q24H  . feeding supplement  237 mL Oral TID BM  . insulin aspart  0-15 Units Subcutaneous TID WC  . insulin aspart  0-5 Units Subcutaneous QHS  . levofloxacin  750 mg Oral Daily  . multivitamin with minerals  1 tablet Oral Daily  . zinc sulfate  220 mg Oral Daily    Antimicrobials: Anti-infectives (From admission, onward)   Start     Dose/Rate Route Frequency Ordered Stop   06/14/20 2000  DAPTOmycin (CUBICIN) 500 mg in sodium chloride 0.9 % IVPB        500 mg 120 mL/hr over 30 Minutes Intravenous Daily 06/13/20 2303     06/14/20 1400  levofloxacin (LEVAQUIN) tablet 750 mg        750 mg Oral Daily 06/13/20 2233     06/14/20 1000  remdesivir 100 mg in sodium chloride 0.9 % 100 mL IVPB       "Followed by" Linked Group Details   100 mg 200 mL/hr over 30 Minutes Intravenous Daily 06/13/20 2139 06/18/20 0959   06/13/20 2200  remdesivir 200 mg in sodium chloride 0.9% 250 mL IVPB       "Followed by" Linked Group Details   200 mg 580 mL/hr over 30 Minutes Intravenous Once 06/13/20 2139 06/14/20 0046       PRN meds: acetaminophen, HYDROcodone-acetaminophen, ondansetron **OR** ondansetron (ZOFRAN) IV   Objective: Vitals:   06/15/20 0845 06/15/20 1144  BP: 126/67 114/72  Pulse: 80 76  Resp: 19 18  Temp: 97.8 F (36.6 C) 98.2 F (36.8 C)  SpO2: 99% 97%    Intake/Output Summary (Last 24 hours) at 06/15/2020 1352 Last data filed at 06/15/2020 0500 Gross  per 24 hour  Intake --  Output 950 ml  Net -950 ml   Filed Weights   06/13/20 1618 06/14/20 0102  Weight: 68 kg 71.8 kg   Weight change:  Body mass index is 25.55 kg/m.   Physical Exam: General exam: Pleasant, middle-aged Caucasian male.  Not in distress Skin: No rashes, lesions or ulcers. HEENT: Atraumatic, normocephalic, no obvious bleeding Lungs: Clear to auscultation bilaterally CVS: Regular rate and rhythm, no murmur GI/Abd soft, nontender, nondistended, bowel sound present CNS: Alert, awake, oriented x3 Psychiatry: Mood appropriate Extremities: No pedal edema, no calf tenderness, bandaged left foot.  Data Review: I have personally reviewed the laboratory data and studies available.  Recent Labs  Lab 06/12/20 1444 06/13/20 1627 06/14/20 0015  WBC 6.0 9.7 8.1  NEUTROABS 4.5 7.3  --   HGB 12.8* 12.3* 11.5*  HCT 37.4* 34.7* 33.5*  MCV 85.2 83.8 85.0  PLT 192 167 139*   Recent Labs  Lab 06/12/20 1444 06/13/20 1627 06/14/20 0015  NA 131* 130*  --   K 4.6 4.1  --   CL 98 98  --   CO2 25 24  --   GLUCOSE 132* 89  --   BUN 23* 21*  --   CREATININE 1.44* 1.27* 1.01  CALCIUM 9.0 8.6*  --     F/u labs ordered Unresulted Labs (From admission, onward)          Start     Ordered   06/21/20 0500  CK  Once-Timed,   TIMED        06/14/20 1927   06/20/20 0500  Creatinine, serum  (enoxaparin (LOVENOX)    CrCl >/= 30 ml/min)  Weekly,   STAT     Comments: while on enoxaparin therapy    06/13/20 2139   06/16/20 0500  Basic metabolic panel  Tomorrow morning,   R        06/15/20 0804   06/16/20 0500  CBC  with Differential/Platelet  Tomorrow morning,   R        06/15/20 0804   06/16/20 0500  Magnesium  Tomorrow morning,   R        06/15/20 0804   06/16/20 0500  Phosphorus  Tomorrow morning,   R        06/15/20 0804   06/15/20 0500  Procalcitonin  Daily,   R      06/14/20 0442          Signed, Lorin Glass, MD [facility] 06/15/2020

## 2020-06-15 NOTE — Progress Notes (Signed)
[facility] CLINIC INFECTIOUS DISEASE PROGRESS NOTE Date of Admission:  06/13/2020     ID: Ashley Mariner is a 60 y.o. male with covid Principal Problem:   Hypotension Active Problems:   Essential hypertension   Type 2 diabetes mellitus with hyperlipidemia (HCC)   Atherosclerosis of native arteries of the extremities with ulceration (HCC)   AKI (acute kidney injury) (HCC)   COVID-19 virus infection   Subjective: No fevers, still with cough. Wbc nml. cxs neg   ROS  Eleven systems are reviewed and negative except per hpi  Medications:  Antibiotics Given (last 72 hours)    Date/Time Action Medication Dose Rate   06/14/20 0014 New Bag/Given   remdesivir 200 mg in sodium chloride 0.9% 250 mL IVPB 200 mg 580 mL/hr   06/14/20 1348 New Bag/Given   remdesivir 100 mg in sodium chloride 0.9 % 100 mL IVPB 100 mg 200 mL/hr   06/14/20 1350 Given   levofloxacin (LEVAQUIN) tablet 750 mg 750 mg    06/14/20 2102 New Bag/Given   DAPTOmycin (CUBICIN) 500 mg in sodium chloride 0.9 % IVPB 500 mg 120 mL/hr   06/15/20 0901 New Bag/Given   remdesivir 100 mg in sodium chloride 0.9 % 100 mL IVPB 100 mg 200 mL/hr   06/15/20 1420 Given   levofloxacin (LEVAQUIN) tablet 750 mg 750 mg      . vitamin C  500 mg Oral Daily  . benzonatate  200 mg Oral TID  . Chlorhexidine Gluconate Cloth  6 each Topical Daily  . collagenase   Topical Q M,W,F  . dextromethorphan-guaiFENesin  1 tablet Oral BID  . enoxaparin (LOVENOX) injection  40 mg Subcutaneous Q24H  . feeding supplement  237 mL Oral TID BM  . insulin aspart  0-15 Units Subcutaneous TID WC  . insulin aspart  0-5 Units Subcutaneous QHS  . levofloxacin  750 mg Oral Daily  . multivitamin with minerals  1 tablet Oral Daily  . zinc sulfate  220 mg Oral Daily    Objective: Vital signs in last 24 hours: Temp:  [97.8 F (36.6 C)-98.4 F (36.9 C)] 98.2 F (36.8 C) (05/18 1144) Pulse Rate:  [76-87] 76 (05/18 1144) Resp:  [18-20] 18 (05/18 1144) BP:  (90-147)/(60-77) 114/72 (05/18 1144) SpO2:  [97 %-99 %] 97 % (05/18 1144) Constitutional: He is oriented to person, place, and time. He appears well-developed and well-nourished. No distress.  HENT: anicteric Mouth/Throat: Oropharynx is clear and moist. No oropharyngeal exudate.  Cardiovascular: Normal rate, regular rhythm and normal heart sounds.  PICC line UE with no redness pain Pulmonary/Chest: Effort normal and breath sounds normal. No respiratory distress. He has no wheezes.  Abdominal: Soft. Bowel sounds are normal. He exhibits no distension. There is no tenderness.  Lymphadenopathy: He has no cervical adenopathy.  Neurological: He is alert and oriented to person, place, and time.  Skin: see photo Psychiatric: He has a normal mood and affect. His behavior is normal.  Ext foot  Examined - well coapted incision line, no tenderness, draiange or erythema    Lab Results Recent Labs    06/13/20 1627 06/14/20 0015  WBC 9.7 8.1  HGB 12.3* 11.5*  HCT 34.7* 33.5*  NA 130*  --   K 4.1  --   CL 98  --   CO2 24  --   BUN 21*  --   CREATININE 1.27* 1.01    Microbiology: Results for orders placed or performed during the hospital encounter of 06/13/20  Blood culture (  routine single)     Status: None (Preliminary result)   Collection Time: 06/13/20  4:33 PM   Specimen: BLOOD  Result Value Ref Range Status   Specimen Description BLOOD BLOOD LEFT ARM  Final   Special Requests   Final    BOTTLES DRAWN AEROBIC AND ANAEROBIC Blood Culture results may not be optimal due to an excessive volume of blood received in culture bottles   Culture   Final    NO GROWTH < 24 HOURS Performed at [facility], [address] [place], [place] [address]    Report Status PENDING  Incomplete  Resp Panel by RT-PCR (Flu A&B, Covid) Nasopharyngeal Swab     Status: Abnormal   Collection Time: 06/13/20  7:10 PM   Specimen: Nasopharyngeal Swab; Nasopharyngeal(NP) swabs in vial transport medium   Result Value Ref Range Status   SARS Coronavirus 2 by RT PCR POSITIVE (A) NEGATIVE Final    Comment: RESULT CALLED TO, READ BACK BY AND VERIFIED WITH: TIMOTHY LEWIS AT 2045 ON 06/13/20 BY SS (NOTE) SARS-CoV-2 target nucleic acids are DETECTED.  The SARS-CoV-2 RNA is generally detectable in upper respiratory specimens during the acute phase of infection. Positive results are indicative of the presence of the identified virus, but do not rule out bacterial infection or co-infection with other pathogens not detected by the test. Clinical correlation with patient history and other diagnostic information is necessary to determine patient infection status. The expected result is Negative.  Fact Sheet for Patients: BloggerCourse.com  Fact Sheet for Healthcare Providers: SeriousBroker.it  This test is not yet approved or cleared by the [place] FDA and  has been authorized for detection and/or diagnosis of SARS-CoV-2 by FDA under an Emergency Use Authorization (EUA).  This EUA will remain in effect (meaning this test c an be used) for the duration of  the COVID-19 declaration under Section 564(b)(1) of the Act, 21 U.S.C. section 360bbb-3(b)(1), unless the authorization is terminated or revoked sooner.     Influenza A by PCR NEGATIVE NEGATIVE Final   Influenza B by PCR NEGATIVE NEGATIVE Final    Comment: (NOTE) The Xpert Xpress SARS-CoV-2/FLU/RSV plus assay is intended as an aid in the diagnosis of influenza from Nasopharyngeal swab specimens and should not be used as a sole basis for treatment. Nasal washings and aspirates are unacceptable for Xpert Xpress SARS-CoV-2/FLU/RSV testing.  Fact Sheet for Patients: BloggerCourse.com  Fact Sheet for Healthcare Providers: SeriousBroker.it  This test is not yet approved or cleared by the [place] FDA and has been authorized for  detection and/or diagnosis of SARS-CoV-2 by FDA under an Emergency Use Authorization (EUA). This EUA will remain in effect (meaning this test can be used) for the duration of the COVID-19 declaration under Section 564(b)(1) of the Act, 21 U.S.C. section 360bbb-3(b)(1), unless the authorization is terminated or revoked.  Performed at [facility], [address], [place], [place] [address]     Studies/Results: [facility] Chest Port 1 View  Result Date: 06/13/2020 CLINICAL DATA:  Sepsis.  Hypotension. EXAM: PORTABLE CHEST 1 VIEW COMPARISON:  06/12/2020 FINDINGS: Left upper extremity PICC line tip is at the cavoatrial junction. Normal heart size. No pleural effusion or edema. No airspace densities identified. IMPRESSION: No acute cardiopulmonary abnormalities. Electronically Signed   By: Signa Kell M.D.   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  Cliniclly stable- suspect fevers from covid. His Hypotension responded to fluid boluses. Foot looks very good.   Recommendations Continue current abx but today can be last day of IV abx as he has completed 2 weeks post op and foot looks very good and his path had clean margins at surgery.  Please dc picc prior to Discharge Can dc on oral doxy 100 mg bid for 14 days. He should have the levofloxacin at home to continue another 14 days. Will see podiatry towards end of treatment and I can see in 2 weeks in clinic.  Mick Sell   06/15/2020, 2:52 PM

## 2020-06-16 LAB — CBC WITH DIFFERENTIAL/PLATELET
Abs Immature Granulocytes: 0.02 10*3/uL (ref 0.00–0.07)
Basophils Absolute: 0 10*3/uL (ref 0.0–0.1)
Basophils Relative: 0 %
Eosinophils Absolute: 0.1 10*3/uL (ref 0.0–0.5)
Eosinophils Relative: 1 %
HCT: 34 % — ABNORMAL LOW (ref 39.0–52.0)
Hemoglobin: 12.2 g/dL — ABNORMAL LOW (ref 13.0–17.0)
Immature Granulocytes: 0 %
Lymphocytes Relative: 33 %
Lymphs Abs: 1.7 10*3/uL (ref 0.7–4.0)
MCH: 29.8 pg (ref 26.0–34.0)
MCHC: 35.9 g/dL (ref 30.0–36.0)
MCV: 82.9 fL (ref 80.0–100.0)
Monocytes Absolute: 0.6 10*3/uL (ref 0.1–1.0)
Monocytes Relative: 12 %
Neutro Abs: 2.8 10*3/uL (ref 1.7–7.7)
Neutrophils Relative %: 54 %
Platelets: 158 10*3/uL (ref 150–400)
RBC: 4.1 MIL/uL — ABNORMAL LOW (ref 4.22–5.81)
RDW: 11.9 % (ref 11.5–15.5)
WBC: 5.2 10*3/uL (ref 4.0–10.5)
nRBC: 0 % (ref 0.0–0.2)

## 2020-06-16 LAB — GLUCOSE, CAPILLARY
Glucose-Capillary: 136 mg/dL — ABNORMAL HIGH (ref 70–99)
Glucose-Capillary: 113 mg/dL — ABNORMAL HIGH (ref 70–99)

## 2020-06-16 LAB — PHOSPHORUS: Phosphorus: 4 mg/dL (ref 2.5–4.6)

## 2020-06-16 LAB — BASIC METABOLIC PANEL
Anion gap: 10 (ref 5–15)
BUN: 17 mg/dL (ref 6–20)
CO2: 25 mmol/L (ref 22–32)
Calcium: 8.8 mg/dL — ABNORMAL LOW (ref 8.9–10.3)
Chloride: 96 mmol/L — ABNORMAL LOW (ref 98–111)
Creatinine, Ser: 0.95 mg/dL (ref 0.61–1.24)
GFR, Estimated: >60 mL/min (ref >60)
Glucose, Bld: 147 mg/dL — ABNORMAL HIGH (ref 70–99)
Potassium: 4.1 mmol/L (ref 3.5–5.1)
Sodium: 131 mmol/L — ABNORMAL LOW (ref 135–145)

## 2020-06-16 LAB — PROCALCITONIN: Procalcitonin: <0.1 ng/mL

## 2020-06-16 LAB — MAGNESIUM: Magnesium: 1.7 mg/dL (ref 1.7–2.4)

## 2020-06-16 MED ORDER — BENZONATATE 200 MG PO CAPS: 200.0000 mg | ORAL_CAPSULE | Freq: Three times a day (TID) | ORAL | 0 refills | Status: AC

## 2020-06-16 MED ORDER — GUAIFENESIN ER 600 MG PO TB12: 600.0000 mg | ORAL_TABLET | Freq: Two times a day (BID) | ORAL | 0 refills | Status: AC

## 2020-06-16 MED ORDER — DOXYCYCLINE HYCLATE 100 MG PO TABS: 100.0000 mg | ORAL_TABLET | Freq: Two times a day (BID) | ORAL | 0 refills | Status: AC

## 2020-06-16 MED ORDER — LEVOFLOXACIN 750 MG PO TABS: 750.0000 mg | ORAL_TABLET | Freq: Every day | ORAL | 0 refills | Status: AC

## 2020-06-16 NOTE — Clinical Social Work Note (Signed)
Patient has orders to discharge home today. IV antibiotics have been discontinued. [facility] representative is aware. No further concerns. CSW signing off.  Charlynn Court, CSW [phone]

## 2020-06-16 NOTE — Progress Notes (Signed)
Pt discharged home with his sister.  Discharge instructions provided and questions answered. VSS and pt without complaints. Pt discharged via wheelchair.

## 2020-06-16 NOTE — Discharge Summary (Signed)
Physician Discharge Summary  Scott Howard RSW:[id number] DOB: [birth year] DOA: 06/13/2020  PCP: Mick Sell, MD  Admit date: 06/13/2020 Discharge date: 06/16/2020  Admitted From: home Discharge disposition: home   Code Status: Full Code  Diet Recommendation: cardiac/diabetic diet  Discharge Diagnosis:   Principal Problem:   Hypotension Active Problems:   Essential hypertension   Type 2 diabetes mellitus with hyperlipidemia (HCC)   Atherosclerosis of native arteries of the extremities with ulceration (HCC)   AKI (acute kidney injury) (HCC)   COVID-19 virus infection  Chief Complaint  Patient presents with  . Hypotension    Brief narrative: Scott Howard is a 60 y.o. 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  Patient presented to the ED on 5/16 with complaint of fever, fatigue, muscle aches, decreased oral intake x 2 days, dizziness and a positive home COVID test.  Blood pressure at home was 68/40.    Admitted under hospitalist service for hypotension and COVID-19 infection See below for details  Subjective: Patient was seen and examined this morning.  Pleasant middle-aged Caucasian male.  Not in distress.  Hospital course: Hypotension -Blood pressure low at 68/40 at home prior to presentation. -Received IV hydration.  Blood pressure improved up to 140s. -Lisinopril on hold. Can resume post discharge.  COVID pneumonia Acute respiratory failure with hypoxia  -Presented with malaise, fatigue, hypotension, cough -COVID test: PCR positive on presentation -Treatment: Completed course of IV remdesivir. -Progression: Not on supplemental oxygen but continues to have significant cough.  Unable to sleep at night because of that. -Continue Tessalon Perles and  Mucinex at home.   -Supportive care: Vitamin C, Zinc, PRN inhalers, Tylenol, Antitussives (benzonatate/ Mucinex/Tussionex).   -Encouraged incentive spirometry, prone position, out of bed and early mobilization as much as possible -Continue airborne/contact isolation precautions for duration of 3 weeks from the day of diagnosis. -WBC and inflammatory markers trend as below. Recent Labs  Lab 06/12/20 1444 06/13/20 1627 06/13/20 1633 06/13/20 1910 06/14/20 0012 06/14/20 0015 06/15/20 0619 06/16/20 0421  SARSCOV2NAA  --   --   --  POSITIVE*  --   --   --   --   WBC 6.0 9.7  --   --   --  8.1  --  5.2  LATICACIDVEN 1.3  --  1.4  --   --  0.8  --   --   PROCALCITON  --   --   --   --  <0.10  --  <0.10 <0.10  DDIMER  --   --   --   --   --  1.08*  --   --   CRP  --   --   --   --  15.1*  --   --   --   ALT 14 12  --   --   --   --   --   --    AKI (acute kidney injury) -Creatinine was elevated to 1.44.  Improved with IV fluid Recent Labs    05/27/20 1157 05/28/20 0434 05/30/20 0714 05/31/20 0622 06/01/20 0500 06/12/20 1444 06/13/20 1627 06/14/20 0015 06/16/20 0421  BUN 22* 25* 22* 16 17 23* 21*  --  17  CREATININE 1.06 1.09 0.80 0.96 0.84 1.44* 1.27* 1.01 0.95   Type 2  diabetes mellitus -A1c 6.7 on 4/29 -Home meds include glipizide 5 mg twice daily. 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 Surgical site remains good. Pathology had clear margins of surgery.   -ID recommended to complete remaining 14-day course of Levaquin.  ID stopped IV daptomycin switched to oral doxycycline 100 mg twice daily for another 14 days.   -Recommended dressing changes every other day with Betadine to the incision on the left foot with  Santyl ointment and moistened gauze to all of the ulcerative areas on both feet with bulky bandages -Continue Aspirin 81 mg daily, Plavix 75 mg daily, pravastatin 20 mg at bedtime   Wound care: Pressure Injury 05/27/20 Right;Left (Active)  Date First Assessed/Time First Assessed: 05/27/20 2000   Location Orientation: Right;Left  Present on Admission: Yes    Assessments 05/27/2020  8:00 PM 06/16/2020  9:00 AM  Dressing Type -- Gauze (Comment)  Dressing Clean;Dry;Intact Clean;Dry;Intact  Dressing Change Frequency PRN --  Drainage Amount -- None     No Linked orders to display     Pressure Injury 05/27/20 Right;Left (Active)  Date First Assessed/Time First Assessed: 05/27/20 2000   Location Orientation: Right;Left  Present on Admission: Yes    Assessments 06/14/2020  9:30 AM 06/16/2020  9:00 AM  Dressing Type Gauze (Comment) Gauze (Comment)  Dressing Dry;Clean;Intact Clean;Dry;Intact  Dressing Change Frequency Monday, Wednesday, Friday --  Drainage Amount None None     No Linked orders to display     Wound / Incision (Open or Dehisced) 05/28/20 Diabetic ulcer Heel Left (Active)  Date First Assessed/Time First Assessed: 05/28/20 0700   Wound Type: Diabetic ulcer  Location: Heel  Location Orientation: Left    Assessments 05/28/2020  7:30 AM 06/15/2020  7:55 PM  Dressing Type -- Gauze (Comment)  Dressing Status Clean;Dry;Intact --  Dressing Change Frequency -- Monday, Wednesday, Friday  Site / Wound Assessment -- Dressing in place / Unable to assess  Drainage Amount -- None     No Linked orders to display     Wound / Incision (Open or Dehisced) 05/28/20 Diabetic ulcer Heel Right (Active)  Date First Assessed/Time First Assessed: 05/28/20 0700   Wound Type: Diabetic ulcer  Location: Heel  Location Orientation: Right    Assessments 05/28/2020  7:30 AM 06/15/2020  7:55 PM  Dressing Type -- Gauze (Comment)  Dressing Changed Changed --  Dressing Change Frequency -- Monday, Wednesday, Friday   Site / Wound Assessment -- Dressing in place / Unable to assess  Drainage Amount -- None     No Linked orders to display     Incision (Closed) 05/29/20 Foot Left (Active)  Date First Assessed/Time First Assessed: 05/29/20 0846   Location: Foot  Location Orientation: Left    Assessments 05/29/2020  9:50 AM 06/15/2020  7:55 PM  Dressing Type -- Gauze (Comment)  Dressing Clean;Dry;Intact Clean;Dry;Intact  Dressing Change Frequency -- Monday, Wednesday, Friday  Site / Wound Assessment Dressing in place / Unable to assess Dressing in place / Unable to assess  Drainage Amount None None     No Linked orders to display    Discharge Exam:   Vitals:   06/15/20 1637 06/15/20 1953 06/16/20 0424 06/16/20 0900  BP: 125/82 129/73 134/75 120/74  Pulse: 77 74 76  79  Resp: 18 16 16 18   Temp: 98 F (36.7 C) 98.2 F (36.8 C) 98 F (36.7 C) 98 F (36.7 C)  TempSrc: Oral Oral Oral Oral  SpO2: 97% 98% 98% 97%  Weight:      Height:        Body mass index is 25.55 kg/m.  General exam: Pleasant middle-aged Caucasian male.  Not in distress Skin: No rashes, lesions or ulcers. HEENT: Atraumatic, normocephalic, no obvious bleeding Lungs: Clear to auscultation bilaterally CVS: Regular rate and rhythm, no murmur GI/Abd soft, nontender, nondistended, bowel sound present CNS: Alert, awake, oriented x3 Psychiatry: Mood appropriate Extremities: No pedal edema, no calf tenderness.  Bandaged left foot  Follow ups:   Discharge Instructions    Diet - low sodium heart healthy   Complete by: As directed    Diet Carb Modified   Complete by: As directed    Discharge wound care:   Complete by: As directed    Apply Santyl ointment and moistened gauze to ulcerations on both feet and apply Betadine along the incision on the left foot followed by dry bulky bandages every Monday, Wednesday, and Friday   Increase activity slowly   Complete by: As directed       Follow-up Information    Wednesday, MD Follow up.   Specialty: Infectious Diseases Contact information: [address] [place] [phone]               Recommendations for Outpatient Follow-Up:   1. Follow-up with PCP as an outpatient 2. Follow-up with podiatry as an outpatient  Discharge Instructions:  Follow with Primary MD [phone], MD in 7 days   Get CBC/BMP checked in next visit within 1 week by PCP or SNF MD ( we routinely change or add medications that can affect your baseline labs and fluid status, therefore we recommend that you get the mentioned basic workup next visit with your PCP, your PCP may decide not to get them or add new tests based on their clinical decision)  On your next visit with your PCP, please Get Medicines reviewed and adjusted.  Please request your PCP  to go over all Hospital Tests and Procedure/Radiological results at the follow up, please get all Hospital records sent to your Prim MD by signing hospital release before you go home.  Activity: As tolerated with Full fall precautions use walker/cane & assistance as needed  For Heart failure patients - Check your Weight same time everyday, if you gain over 2 pounds, or you develop in leg swelling, experience more shortness of breath or chest pain, call your Primary MD immediately. Follow Cardiac Low Salt Diet and 1.5 lit/day fluid restriction.  If you have smoked or chewed Tobacco in the last 2 yrs please stop smoking, stop any regular Alcohol  and or any Recreational drug use.  If you experience worsening of your admission symptoms, develop shortness of breath, life threatening emergency, suicidal or homicidal thoughts you must seek medical attention immediately by calling 911 or calling your MD immediately  if symptoms less severe.  You Must read complete instructions/literature along with all the possible adverse reactions/side effects for all the Medicines you take and that have been prescribed to  you. Take any new Medicines after you have completely understood and accpet all the possible adverse reactions/side effects.   Do not drive, operate heavy machinery, perform activities at heights, swimming or participation in water activities or provide baby  sitting services if your were admitted for syncope or siezures until you have seen by Primary MD or a Neurologist and advised to do so again.  Do not drive when taking Pain medications.  Do not take more than prescribed Pain, Sleep and Anxiety Medications  Wear Seat belts while driving.   Please note You were cared for by a hospitalist during your hospital stay. If you have any questions about your discharge medications or the care you received while you were in the hospital after you are discharged, you can call the unit and asked to speak with the hospitalist on call if the hospitalist that took care of you is not available. Once you are discharged, your primary care physician will handle any further medical issues. Please note that NO REFILLS for any discharge medications will be authorized once you are discharged, as it is imperative that you return to your primary care physician (or establish a relationship with a primary care physician if you do not have one) for your aftercare needs so that they can reassess your need for medications and monitor your lab values.    Allergies as of 06/16/2020      Reactions   Silvadene [silver Sulfadiazine] Rash   Sulfa Antibiotics Rash      Medication List    STOP taking these medications   daptomycin  IVPB Commonly known as: CUBICIN     TAKE these medications   aspirin 81 MG EC tablet Take 1 tablet (81 mg total) by mouth daily. Swallow whole.   benzonatate 200 MG capsule Commonly known as: TESSALON Take 1 capsule (200 mg total) by mouth 3 (three) times daily for 7 days.   clopidogrel 75 MG tablet Commonly known as: Plavix Take 1 tablet (75 mg total) by mouth daily.   doxycycline 100  MG tablet Commonly known as: VIBRA-TABS Take 1 tablet (100 mg total) by mouth 2 (two) times daily for 14 days.   glipiZIDE 5 MG tablet Commonly known as: GLUCOTROL Take 5 mg by mouth 2 (two) times daily.   guaiFENesin 600 MG 12 hr tablet Commonly known as: Mucinex Take 1 tablet (600 mg total) by mouth 2 (two) times daily for 7 days.   levofloxacin 750 MG tablet Commonly known as: LEVAQUIN Take 1 tablet (750 mg total) by mouth daily at 2 PM for 14 doses.   lisinopril 10 MG tablet Commonly known as: ZESTRIL Take 10 mg by mouth daily.   magnesium oxide 400 (240 Mg) MG tablet Commonly known as: MAG-OX Take 1 tablet (400 mg total) by mouth daily.   nirmatrelvir/ritonavir EUA Tabs Commonly known as: PAXLOVID Take 3 tablets by mouth 2 (two) times daily for 5 days. Patient GFR is 56. Take nirmatrelvir (150 mg) 2 tablet(s) twice daily for 5 days and ritonavir (100 mg) one tablet twice daily for 5 days.   oxyCODONE 5 MG immediate release tablet Commonly known as: Oxy IR/ROXICODONE Take 1-2 tablets (5-10 mg total) by mouth every 4 (four) hours as needed for moderate pain.   pravastatin 20 MG tablet Commonly known as: PRAVACHOL Take 20 mg by mouth at bedtime.   Santyl ointment Generic drug: collagenase Apply topically daily.            Discharge Care Instructions  (From admission, onward)         Start     Ordered   06/16/20 0000  Discharge wound care:       Comments: Apply Santyl ointment and moistened gauze to  ulcerations on both feet and apply Betadine along the incision on the left foot followed by dry bulky bandages every Monday, Wednesday, and Friday   06/16/20 1126          Time coordinating discharge: 35 minutes  The results of significant diagnostics from this hospitalization (including imaging, microbiology, ancillary and laboratory) are listed below for reference.    Procedures and Diagnostic Studies:   DG Chest Port 1 View  Result Date:  06/13/2020 CLINICAL DATA:  Sepsis.  Hypotension. EXAM: PORTABLE CHEST 1 VIEW COMPARISON:  06/12/2020 FINDINGS: Left upper extremity PICC line tip is at the cavoatrial junction. Normal heart size. No pleural effusion or edema. No airspace densities identified. IMPRESSION: No acute cardiopulmonary abnormalities. Electronically Signed   By: Signa Kellaylor  Stroud M.D.   On: 06/13/2020 17:15     Labs:   Basic Metabolic Panel: Recent Labs  Lab 06/12/20 1444 06/13/20 1627 06/14/20 0015 06/16/20 0421  NA 131* 130*  --  131*  K 4.6 4.1  --  4.1  CL 98 98  --  96*  CO2 25 24  --  25  GLUCOSE 132* 89  --  147*  BUN 23* 21*  --  17  CREATININE 1.44* 1.27* 1.01 0.95  CALCIUM 9.0 8.6*  --  8.8*  MG  --   --   --  1.7  PHOS  --   --   --  4.0   GFR Estimated Creatinine Clearance: 74.6 mL/min (by C-G formula based on SCr of 0.95 mg/dL). Liver Function Tests: Recent Labs  Lab 06/12/20 1444 06/13/20 1627  AST 14* 16  ALT 14 12  ALKPHOS 73 63  BILITOT 1.2 1.4*  PROT 7.9 7.7  ALBUMIN 3.5 3.4*   No results for input(s): LIPASE, AMYLASE in the last 168 hours. No results for input(s): AMMONIA in the last 168 hours. Coagulation profile No results for input(s): INR, PROTIME in the last 168 hours.  CBC: Recent Labs  Lab 06/12/20 1444 06/13/20 1627 06/14/20 0015 06/16/20 0421  WBC 6.0 9.7 8.1 5.2  NEUTROABS 4.5 7.3  --  2.8  HGB 12.8* 12.3* 11.5* 12.2*  HCT 37.4* 34.7* 33.5* 34.0*  MCV 85.2 83.8 85.0 82.9  PLT 192 167 139* 158   Cardiac Enzymes: Recent Labs  Lab 06/14/20 0012  CKTOTAL 71   BNP: Invalid input(s): POCBNP CBG: Recent Labs  Lab 06/15/20 0757 06/15/20 1142 06/15/20 1635 06/15/20 1949 06/16/20 0726  GLUCAP 113* 173* 127* 144* 136*   D-Dimer Recent Labs    06/14/20 0015  DDIMER 1.08*   Hgb A1c No results for input(s): HGBA1C in the last 72 hours. Lipid Profile No results for input(s): CHOL, HDL, LDLCALC, TRIG, CHOLHDL, LDLDIRECT in the last 72  hours. Thyroid function studies No results for input(s): TSH, T4TOTAL, T3FREE, THYROIDAB in the last 72 hours.  Invalid input(s): FREET3 Anemia work up No results for input(s): VITAMINB12, FOLATE, FERRITIN, TIBC, IRON, RETICCTPCT in the last 72 hours. Microbiology Recent Results (from the past 240 hour(s))  Blood culture (routine single)     Status: None (Preliminary result)   Collection Time: 06/13/20  4:33 PM   Specimen: BLOOD  Result Value Ref Range Status   Specimen Description BLOOD BLOOD LEFT ARM  Final   Special Requests   Final    BOTTLES DRAWN AEROBIC AND ANAEROBIC Blood Culture results may not be optimal due to an excessive volume of blood received in culture bottles   Culture   Final  NO GROWTH 3 DAYS Performed at [facility], [address] [place], [place] [address]    Report Status PENDING  Incomplete  Resp Panel by RT-PCR (Flu A&B, Covid) Nasopharyngeal Swab     Status: Abnormal   Collection Time: 06/13/20  7:10 PM   Specimen: Nasopharyngeal Swab; Nasopharyngeal(NP) swabs in vial transport medium  Result Value Ref Range Status   SARS Coronavirus 2 by RT PCR POSITIVE (A) NEGATIVE Final    Comment: RESULT CALLED TO, READ BACK BY AND VERIFIED WITH: TIMOTHY LEWIS AT 2045 ON 06/13/20 BY SS (NOTE) SARS-CoV-2 target nucleic acids are DETECTED.  The SARS-CoV-2 RNA is generally detectable in upper respiratory specimens during the acute phase of infection. Positive results are indicative of the presence of the identified virus, but do not rule out bacterial infection or co-infection with other pathogens not detected by the test. Clinical correlation with patient history and other diagnostic information is necessary to determine patient infection status. The expected result is Negative.  Fact Sheet for Patients: BloggerCourse.com  Fact Sheet for Healthcare Providers: SeriousBroker.it  This test is not yet  approved or cleared by the [place] FDA and  has been authorized for detection and/or diagnosis of SARS-CoV-2 by FDA under an Emergency Use Authorization (EUA).  This EUA will remain in effect (meaning this test c an be used) for the duration of  the COVID-19 declaration under Section 564(b)(1) of the Act, 21 U.S.C. section 360bbb-3(b)(1), unless the authorization is terminated or revoked sooner.     Influenza A by PCR NEGATIVE NEGATIVE Final   Influenza B by PCR NEGATIVE NEGATIVE Final    Comment: (NOTE) The Xpert Xpress SARS-CoV-2/FLU/RSV plus assay is intended as an aid in the diagnosis of influenza from Nasopharyngeal swab specimens and should not be used as a sole basis for treatment. Nasal washings and aspirates are unacceptable for Xpert Xpress SARS-CoV-2/FLU/RSV testing.  Fact Sheet for Patients: BloggerCourse.com  Fact Sheet for Healthcare Providers: SeriousBroker.it  This test is not yet approved or cleared by the [place] FDA and has been authorized for detection and/or diagnosis of SARS-CoV-2 by FDA under an Emergency Use Authorization (EUA). This EUA will remain in effect (meaning this test can be used) for the duration of the COVID-19 declaration under Section 564(b)(1) of the Act, 21 U.S.C. section 360bbb-3(b)(1), unless the authorization is terminated or revoked.  Performed at [facility], [address], [place], [place] [address]      Signed: Lorin Glass  [facility] 06/16/2020, 11:26 AM

## 2020-06-18 LAB — CULTURE, BLOOD (SINGLE): Culture: NO GROWTH

## 2020-06-21 ENCOUNTER — Other Ambulatory Visit (INDEPENDENT_AMBULATORY_CARE_PROVIDER_SITE_OTHER): Payer: Self-pay | Admitting: Vascular Surgery

## 2020-06-21 DIAGNOSIS — Z9862 Peripheral vascular angioplasty status: Secondary | ICD-10-CM

## 2020-06-21 DIAGNOSIS — I70235 Atherosclerosis of native arteries of right leg with ulceration of other part of foot: Secondary | ICD-10-CM

## 2020-06-22 ENCOUNTER — Encounter (INDEPENDENT_AMBULATORY_CARE_PROVIDER_SITE_OTHER): Payer: Self-pay | Admitting: Nurse Practitioner

## 2020-06-22 ENCOUNTER — Ambulatory Visit (INDEPENDENT_AMBULATORY_CARE_PROVIDER_SITE_OTHER): Payer: 59

## 2020-06-22 ENCOUNTER — Other Ambulatory Visit: Payer: Self-pay

## 2020-06-22 ENCOUNTER — Ambulatory Visit (INDEPENDENT_AMBULATORY_CARE_PROVIDER_SITE_OTHER): Payer: 59 | Admitting: Nurse Practitioner

## 2020-06-22 VITALS — BP 101/62 | HR 82 | Resp 16

## 2020-06-22 DIAGNOSIS — E782 Mixed hyperlipidemia: Secondary | ICD-10-CM | POA: Diagnosis not present

## 2020-06-22 DIAGNOSIS — I10 Essential (primary) hypertension: Secondary | ICD-10-CM

## 2020-06-22 DIAGNOSIS — I70235 Atherosclerosis of native arteries of right leg with ulceration of other part of foot: Secondary | ICD-10-CM | POA: Diagnosis not present

## 2020-06-22 DIAGNOSIS — Z9862 Peripheral vascular angioplasty status: Secondary | ICD-10-CM | POA: Diagnosis not present

## 2020-07-03 ENCOUNTER — Encounter (INDEPENDENT_AMBULATORY_CARE_PROVIDER_SITE_OTHER): Payer: Self-pay | Admitting: Nurse Practitioner

## 2020-07-03 NOTE — Progress Notes (Signed)
Subjective:    Patient ID: Scott Howard, male    DOB: 1960-10-05, 60 y.o.   MRN: [id number] Chief Complaint  Patient presents with  . Follow-up    [facility] 2 week necrosis atherosclerosis of artery extremity with ulceration    The patient returns to the office for followup and review status post angiogram with intervention. The patient notes improvement in the lower extremity symptoms. No interval shortening of the patient's claudication distance or rest pain symptoms.  The previous ulcerations are in progress of healing.  The patient is continue to follow with podiatry.  The patient had a left fourth and fifth toe amputation done.  The patient underwent intervention on his left lower extremity on 05/27/2020 and on the right on 05/31/2020.  There have been no significant changes to the patient's overall health care.  The patient denies amaurosis fugax or recent TIA symptoms. There are no recent neurological changes noted. The patient denies history of DVT, PE or superficial thrombophlebitis. The patient denies recent episodes of angina or shortness of breath.   ABI is noncompressible bilaterally Duplex US of the bilateral tibial arteries reveals biphasic/triphasic waveforms   Review of Systems  Skin: Positive for wound.  Neurological: Positive for weakness.  All other systems reviewed and are negative.      Objective:   Physical Exam Vitals reviewed.  HENT:     Head: Normocephalic.  Cardiovascular:     Rate and Rhythm: Normal rate.     Pulses: Normal pulses.  Pulmonary:     Effort: Pulmonary effort is normal.  Neurological:     Mental Status: He is alert and oriented to person, place, and time.     Motor: Weakness present.  Psychiatric:        Mood and Affect: Mood normal.        Behavior: Behavior normal.        Thought Content: Thought content normal.        Judgment: Judgment normal.     BP 101/62 (BP Location: Right Arm)   Pulse 82   Resp 16   Past Medical  History:  Diagnosis Date  . Diabetes mellitus without complication (HCC)   . Hyperlipidemia    borderline  . Hypertension     Social History   Socioeconomic History  . Marital status: Divorced    Spouse name: Not on file  . Number of children: Not on file  . Years of education: Not on file  . Highest education level: Not on file  Occupational History  . Not on file  Tobacco Use  . Smoking status: Never Smoker  . Smokeless tobacco: Never Used  Vaping Use  . Vaping Use: Never used  Substance and Sexual Activity  . Alcohol use: Yes    Comment: ocassionally  . Drug use: Never  . Sexual activity: Not on file  Other Topics Concern  . Not on file  Social History Narrative  . Not on file   Social Determinants of Health   Financial Resource Strain: Not on file  Food Insecurity: Not on file  Transportation Needs: Not on file  Physical Activity: Not on file  Stress: Not on file  Social Connections: Not on file  Intimate Partner Violence: Not on file    Past Surgical History:  Procedure Laterality Date  . AMPUTATION TOE Left 05/29/2020   Procedure: 4th and 5th TOE AMPUTATION WITH PARTIAL RAY RESECTION;  Surgeon: Linus Galas, DPM;  Location: [facility] ORS;  Service: Podiatry;  Laterality: Left;  . HAND SURGERY Left   . LOWER EXTREMITY ANGIOGRAPHY Left 05/27/2020   Procedure: Lower Extremity Angiography;  Surgeon: Renford DillsSchnier, Gregory G, MD;  Location: [facility] INVASIVE CV LAB;  Service: Cardiovascular;  Laterality: Left;  . LOWER EXTREMITY ANGIOGRAPHY Right 05/31/2020   Procedure: Lower Extremity Angiography;  Surgeon: Renford DillsSchnier, Gregory G, MD;  [facility];  Service: Cardiovascular;  Laterality: Right;  . TONSILLECTOMY AND ADENOIDECTOMY      Family History  Problem Relation Age of Onset  . Cancer Mother   . Cancer Father   . Hypertension Brother   . Congestive Heart Failure Brother     Allergies  Allergen Reactions  . Silvadene [Silver Sulfadiazine] Rash  . Sulfa  Antibiotics Rash    CBC Latest Ref Rng & Units 06/16/2020 06/14/2020 06/13/2020  WBC 4.0 - 10.5 K/uL 5.2 8.1 9.7  Hemoglobin 13.0 - 17.0 g/dL 12.2(L) 11.5(L) 12.3(L)  Hematocrit 39.0 - 52.0 % 34.0(L) 33.5(L) 34.7(L)  Platelets 150 - 400 K/uL 158 139(L) 167      CMP     Component Value Date/Time   NA 131 (L) 06/16/2020 0421   K 4.1 06/16/2020 0421   CL 96 (L) 06/16/2020 0421   CO2 25 06/16/2020 0421   GLUCOSE 147 (H) 06/16/2020 0421   BUN 17 06/16/2020 0421   CREATININE 0.95 06/16/2020 0421   CALCIUM 8.8 (L) 06/16/2020 0421   PROT 7.7 06/13/2020 1627   ALBUMIN 3.4 (L) 06/13/2020 1627   AST 16 06/13/2020 1627   ALT 12 06/13/2020 1627   ALKPHOS 63 06/13/2020 1627   BILITOT 1.4 (H) 06/13/2020 1627   GFRNONAA >60 06/16/2020 0421     VAS US ABI WITH/WO TBI  Result Date: 05/26/2020  LOWER EXTREMITY DOPPLER STUDY Patient Name:  Scott Howard  Date of Exam:   05/26/2020 Medical Rec #: [id number]       Accession #:    [id number] Date of Birth: 10-15-1960       Patient Gender: M Patient Age:   60Y Exam Location:  [facility] Procedure:      VAS US ABI WITH/WO TBI Referring Phys: [id number] Latina CraverGREGORY G SCHNIER --------------------------------------------------------------------------------  Indications: Peripheral artery disease.  Performing Technologist: Debbe BalesSolomon Mcclary RVS  Examination Guidelines: A complete evaluation includes at minimum, Doppler waveform signals and systolic blood pressure reading at the level of bilateral brachial, anterior tibial, and posterior tibial arteries, when vessel segments are accessible. Bilateral testing is considered an integral part of a complete examination. Photoelectric Plethysmograph (PPG) waveforms and toe systolic pressure readings are included as required and additional duplex testing as needed. Limited examinations for reoccurring indications may be performed as noted.  ABI Findings: +---------+------------------+-----+--------+--------+ Right     Rt Pressure (mmHg)IndexWaveformComment  +---------+------------------+-----+--------+--------+ Brachial 149                                     +---------+------------------+-----+--------+--------+ ATA      139               0.89 biphasic         +---------+------------------+-----+--------+--------+ PTA      193               1.23 biphasic         +---------+------------------+-----+--------+--------+ Great Toe250               1.59 Normal  [place]       +---------+------------------+-----+--------+--------+ +---------+------------------+-----+--------+-------+  Left     Lt Pressure (mmHg)IndexWaveformComment +---------+------------------+-----+--------+-------+ Brachial 157                                    +---------+------------------+-----+--------+-------+ ATA      250               1.59 biphasic        +---------+------------------+-----+--------+-------+ PTA      250               1.59 biphasic        +---------+------------------+-----+--------+-------+ Great Toe115               0.73 Normal          +---------+------------------+-----+--------+-------+ +-------+-----------+-----------+------------+------------+ ABI/TBIToday's ABIToday's TBIPrevious ABIPrevious TBI +-------+-----------+-----------+------------+------------+ Right  1.23       >1.0 [place]                             +-------+-----------+-----------+------------+------------+ Left   >1.0 [place]    .73                                 +-------+-----------+-----------+------------+------------+  Summary: Right: Resting right ankle-brachial index is within normal range. No evidence of significant right lower extremity arterial disease. The right toe-brachial index is normal. Left: Resting left ankle-brachial index indicates noncompressible left lower extremity arteries. The left toe-brachial index is normal.  *See table(s) above for measurements and observations.  Electronically  signed by [name] MD on 05/26/2020 at 5:50:25 PM.    Final        Assessment & Plan:   1. 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  The patient continues to show evidence of wound healing and continues to follow with podiatry.  No further invasive studies, angiography or surgery at this time The patient should continue walking and begin a more formal exercise program.  The patient should continue antiplatelet therapy and aggressive treatment of the lipid abnormalities  Patient should undergo noninvasive studies as ordered. The patient will follow up with me after the studies.   Plan to have the patient follow-up in 3 months or sooner if issues arise. 2. HTN (hypertension), benign Continue antihypertensive medications as already ordered, these medications have been reviewed and there are no changes at this time.   3. Mixed hyperlipidemia Continue statin as ordered and reviewed, no changes at this time    Current Outpatient Medications on File Prior to Visit  Medication Sig Dispense Refill  . aspirin EC 81 MG EC tablet Take 1 tablet (81 mg total) by mouth daily. Swallow whole. 30 tablet 11  . clopidogrel (PLAVIX) 75 MG tablet Take 1 tablet (75 mg total) by mouth daily. 90 tablet 3  . glipiZIDE (GLUCOTROL) 5 MG tablet Take 5 mg by mouth 2 (two) times daily.    Marland Kitchen lisinopril (ZESTRIL) 10 MG tablet Take 10 mg by mouth daily.    . magnesium oxide (MAG-OX) 400 (240 Mg) MG tablet Take 1 tablet (400 mg total) by mouth daily. 30 tablet 0  . pravastatin (PRAVACHOL) 20 MG tablet Take 20 mg by mouth at bedtime.    Marland Kitchen SANTYL ointment Apply topically daily.    Marland Kitchen oxyCODONE (  OXY IR/ROXICODONE) 5 MG immediate release tablet Take 1-2 tablets (5-10 mg total) by mouth every 4 (four) hours as needed for moderate pain. 30 tablet 0   No current  facility-administered medications on file prior to visit.    There are no Patient Instructions on file for this visit. No follow-ups on file.   Georgiana Spinner, NP

## 2020-08-09 ENCOUNTER — Other Ambulatory Visit (INDEPENDENT_AMBULATORY_CARE_PROVIDER_SITE_OTHER): Payer: Self-pay | Admitting: Vascular Surgery

## 2020-08-09 ENCOUNTER — Other Ambulatory Visit: Payer: Self-pay

## 2020-08-09 ENCOUNTER — Inpatient Hospital Stay
Admission: EM | Admit: 2020-08-09 | Discharge: 2020-08-15 | DRG: 629 | Disposition: A | Discharge status: Home / Self Care | Payer: 59 | Attending: Internal Medicine | Admitting: Internal Medicine

## 2020-08-09 ENCOUNTER — Inpatient Hospital Stay: Payer: 59

## 2020-08-09 DIAGNOSIS — L03115 Cellulitis of right lower limb: Secondary | ICD-10-CM | POA: Diagnosis present

## 2020-08-09 DIAGNOSIS — E11621 Type 2 diabetes mellitus with foot ulcer: Secondary | ICD-10-CM | POA: Diagnosis present

## 2020-08-09 DIAGNOSIS — E1165 Type 2 diabetes mellitus with hyperglycemia: Secondary | ICD-10-CM | POA: Diagnosis present

## 2020-08-09 DIAGNOSIS — Z8249 Family history of ischemic heart disease and other diseases of the circulatory system: Secondary | ICD-10-CM | POA: Diagnosis not present

## 2020-08-09 DIAGNOSIS — I10 Essential (primary) hypertension: Secondary | ICD-10-CM | POA: Diagnosis present

## 2020-08-09 DIAGNOSIS — L97519 Non-pressure chronic ulcer of other part of right foot with unspecified severity: Secondary | ICD-10-CM | POA: Diagnosis present

## 2020-08-09 DIAGNOSIS — Z7982 Long term (current) use of aspirin: Secondary | ICD-10-CM

## 2020-08-09 DIAGNOSIS — E1169 Type 2 diabetes mellitus with other specified complication: Secondary | ICD-10-CM | POA: Diagnosis present

## 2020-08-09 DIAGNOSIS — M86171 Other acute osteomyelitis, right ankle and foot: Secondary | ICD-10-CM | POA: Diagnosis present

## 2020-08-09 DIAGNOSIS — E11628 Type 2 diabetes mellitus with other skin complications: Secondary | ICD-10-CM | POA: Diagnosis present

## 2020-08-09 DIAGNOSIS — Z7902 Long term (current) use of antithrombotics/antiplatelets: Secondary | ICD-10-CM | POA: Diagnosis not present

## 2020-08-09 DIAGNOSIS — E875 Hyperkalemia: Secondary | ICD-10-CM | POA: Diagnosis present

## 2020-08-09 DIAGNOSIS — Z8616 Personal history of COVID-19: Secondary | ICD-10-CM | POA: Diagnosis not present

## 2020-08-09 DIAGNOSIS — N179 Acute kidney failure, unspecified: Secondary | ICD-10-CM | POA: Diagnosis present

## 2020-08-09 DIAGNOSIS — M609 Myositis, unspecified: Secondary | ICD-10-CM | POA: Diagnosis present

## 2020-08-09 DIAGNOSIS — L97509 Non-pressure chronic ulcer of other part of unspecified foot with unspecified severity: Secondary | ICD-10-CM | POA: Diagnosis present

## 2020-08-09 DIAGNOSIS — E11319 Type 2 diabetes mellitus with unspecified diabetic retinopathy without macular edema: Secondary | ICD-10-CM | POA: Diagnosis present

## 2020-08-09 DIAGNOSIS — E1142 Type 2 diabetes mellitus with diabetic polyneuropathy: Secondary | ICD-10-CM | POA: Diagnosis present

## 2020-08-09 DIAGNOSIS — I350 Nonrheumatic aortic (valve) stenosis: Secondary | ICD-10-CM | POA: Diagnosis present

## 2020-08-09 DIAGNOSIS — L97529 Non-pressure chronic ulcer of other part of left foot with unspecified severity: Secondary | ICD-10-CM | POA: Diagnosis present

## 2020-08-09 DIAGNOSIS — T464X5A Adverse effect of angiotensin-converting-enzyme inhibitors, initial encounter: Secondary | ICD-10-CM | POA: Diagnosis present

## 2020-08-09 DIAGNOSIS — E1151 Type 2 diabetes mellitus with diabetic peripheral angiopathy without gangrene: Secondary | ICD-10-CM | POA: Diagnosis present

## 2020-08-09 DIAGNOSIS — E785 Hyperlipidemia, unspecified: Secondary | ICD-10-CM | POA: Diagnosis present

## 2020-08-09 DIAGNOSIS — I70235 Atherosclerosis of native arteries of right leg with ulceration of other part of foot: Secondary | ICD-10-CM | POA: Diagnosis not present

## 2020-08-09 DIAGNOSIS — M869 Osteomyelitis, unspecified: Secondary | ICD-10-CM | POA: Diagnosis not present

## 2020-08-09 DIAGNOSIS — M009 Pyogenic arthritis, unspecified: Secondary | ICD-10-CM | POA: Diagnosis present

## 2020-08-09 DIAGNOSIS — Z89422 Acquired absence of other left toe(s): Secondary | ICD-10-CM

## 2020-08-09 DIAGNOSIS — R011 Cardiac murmur, unspecified: Secondary | ICD-10-CM | POA: Diagnosis not present

## 2020-08-09 DIAGNOSIS — Z7984 Long term (current) use of oral hypoglycemic drugs: Secondary | ICD-10-CM | POA: Diagnosis not present

## 2020-08-09 DIAGNOSIS — Z79899 Other long term (current) drug therapy: Secondary | ICD-10-CM

## 2020-08-09 DIAGNOSIS — L089 Local infection of the skin and subcutaneous tissue, unspecified: Secondary | ICD-10-CM | POA: Diagnosis not present

## 2020-08-09 LAB — COMPREHENSIVE METABOLIC PANEL
ALT: 16 U/L (ref 0–44)
AST: 14 U/L — ABNORMAL LOW (ref 15–41)
Albumin: 3.9 g/dL (ref 3.5–5.0)
Alkaline Phosphatase: 84 U/L (ref 38–126)
Anion gap: 7 (ref 5–15)
BUN: 32 mg/dL — ABNORMAL HIGH (ref 6–20)
CO2: 26 mmol/L (ref 22–32)
Calcium: 9.5 mg/dL (ref 8.9–10.3)
Chloride: 103 mmol/L (ref 98–111)
Creatinine, Ser: 1.32 mg/dL — ABNORMAL HIGH (ref 0.61–1.24)
GFR, Estimated: >60 mL/min (ref >60)
Glucose, Bld: 138 mg/dL — ABNORMAL HIGH (ref 70–99)
Potassium: 5.3 mmol/L — ABNORMAL HIGH (ref 3.5–5.1)
Sodium: 136 mmol/L (ref 135–145)
Total Bilirubin: 1.1 mg/dL (ref 0.3–1.2)
Total Protein: 8.3 g/dL — ABNORMAL HIGH (ref 6.5–8.1)

## 2020-08-09 LAB — CBC WITH DIFFERENTIAL/PLATELET
Abs Immature Granulocytes: 0.02 10*3/uL (ref 0.00–0.07)
Basophils Absolute: 0 10*3/uL (ref 0.0–0.1)
Basophils Relative: 1 %
Eosinophils Absolute: 0.1 10*3/uL (ref 0.0–0.5)
Eosinophils Relative: 1 %
HCT: 42.7 % (ref 39.0–52.0)
Hemoglobin: 14.5 g/dL (ref 13.0–17.0)
Immature Granulocytes: 0 %
Lymphocytes Relative: 24 %
Lymphs Abs: 1.6 10*3/uL (ref 0.7–4.0)
MCH: 29.8 pg (ref 26.0–34.0)
MCHC: 34 g/dL (ref 30.0–36.0)
MCV: 87.7 fL (ref 80.0–100.0)
Monocytes Absolute: 0.6 10*3/uL (ref 0.1–1.0)
Monocytes Relative: 9 %
Neutro Abs: 4.3 10*3/uL (ref 1.7–7.7)
Neutrophils Relative %: 65 %
Platelets: 233 10*3/uL (ref 150–400)
RBC: 4.87 MIL/uL (ref 4.22–5.81)
RDW: 12.7 % (ref 11.5–15.5)
WBC: 6.6 10*3/uL (ref 4.0–10.5)
nRBC: 0 % (ref 0.0–0.2)

## 2020-08-09 LAB — HEMOGLOBIN A1C
Hgb A1c MFr Bld: 6 % — ABNORMAL HIGH (ref 4.8–5.6)
Mean Plasma Glucose: 125.5 mg/dL

## 2020-08-09 LAB — LACTIC ACID, PLASMA: Lactic Acid, Venous: 1 mmol/L (ref 0.5–1.9)

## 2020-08-09 LAB — SEDIMENTATION RATE: Sed Rate: 23 mm/hr — ABNORMAL HIGH (ref 0–20)

## 2020-08-09 LAB — SARS CORONAVIRUS 2 (TAT 6-24 HRS): SARS Coronavirus 2: POSITIVE — AB

## 2020-08-09 MED ORDER — SODIUM CHLORIDE 0.9 % IV SOLN
2.0000 g | INTRAVENOUS | Status: DC
  Administered 2020-08-09 – 2020-08-12 (×4): 2 g via INTRAVENOUS
  Filled 2020-08-09 (×3): qty 20
  Filled 2020-08-09 (×2): qty 2

## 2020-08-09 MED ORDER — SODIUM CHLORIDE 0.9 % IV SOLN: INTRAVENOUS | Status: DC

## 2020-08-09 MED ORDER — ENOXAPARIN SODIUM 40 MG/0.4ML IJ SOSY
40.0000 mg | PREFILLED_SYRINGE | INTRAMUSCULAR | Status: DC
  Administered 2020-08-09 – 2020-08-14 (×6): 40 mg via SUBCUTANEOUS
  Filled 2020-08-09 (×6): qty 0.4

## 2020-08-09 MED ORDER — LISINOPRIL 10 MG PO TABS
10.0000 mg | ORAL_TABLET | Freq: Every day | ORAL | Status: DC
  Administered 2020-08-09: 10 mg via ORAL
  Filled 2020-08-09: qty 1

## 2020-08-09 MED ORDER — MAGNESIUM OXIDE -MG SUPPLEMENT 400 (240 MG) MG PO TABS
400.0000 mg | ORAL_TABLET | Freq: Every day | ORAL | Status: DC
  Administered 2020-08-09 – 2020-08-15 (×5): 400 mg via ORAL
  Filled 2020-08-09 (×5): qty 1

## 2020-08-09 MED ORDER — METRONIDAZOLE 500 MG/100ML IV SOLN
500.0000 mg | Freq: Three times a day (TID) | INTRAVENOUS | Status: DC
  Administered 2020-08-09 – 2020-08-15 (×18): 500 mg via INTRAVENOUS
  Filled 2020-08-09 (×23): qty 100

## 2020-08-09 MED ORDER — COLLAGENASE 250 UNIT/GM EX OINT
TOPICAL_OINTMENT | Freq: Every day | CUTANEOUS | Status: DC
  Filled 2020-08-09: qty 30

## 2020-08-09 MED ORDER — ACETAMINOPHEN 325 MG PO TABS: 650.0000 mg | ORAL_TABLET | Freq: Four times a day (QID) | ORAL | Status: DC | PRN

## 2020-08-09 MED ORDER — ONDANSETRON HCL 4 MG/2ML IJ SOLN: 4.0000 mg | Freq: Four times a day (QID) | INTRAMUSCULAR | Status: DC | PRN

## 2020-08-09 MED ORDER — CLOPIDOGREL BISULFATE 75 MG PO TABS: 75.0000 mg | ORAL_TABLET | Freq: Every day | ORAL | Status: DC

## 2020-08-09 MED ORDER — SODIUM CHLORIDE 0.9 % IV SOLN: 2.0000 g | INTRAVENOUS | Status: DC

## 2020-08-09 MED ORDER — OXYCODONE HCL 5 MG PO TABS
5.0000 mg | ORAL_TABLET | Freq: Four times a day (QID) | ORAL | Status: DC | PRN
Start: 2020-08-09 — End: 2020-08-11

## 2020-08-09 MED ORDER — PRAVASTATIN SODIUM 20 MG PO TABS
20.0000 mg | ORAL_TABLET | Freq: Every day | ORAL | Status: DC
  Administered 2020-08-09 – 2020-08-14 (×6): 20 mg via ORAL
  Filled 2020-08-09 (×7): qty 1

## 2020-08-09 MED ORDER — INSULIN ASPART 100 UNIT/ML IJ SOLN
0.0000 [IU] | Freq: Three times a day (TID) | INTRAMUSCULAR | Status: DC
  Administered 2020-08-11 (×2): 3 [IU] via SUBCUTANEOUS
  Administered 2020-08-12: 8 [IU] via SUBCUTANEOUS
  Administered 2020-08-13 – 2020-08-15 (×4): 2 [IU] via SUBCUTANEOUS
  Filled 2020-08-09 (×8): qty 1

## 2020-08-09 MED ORDER — ONDANSETRON HCL 4 MG PO TABS: 4.0000 mg | ORAL_TABLET | Freq: Four times a day (QID) | ORAL | Status: DC | PRN

## 2020-08-09 MED ORDER — METRONIDAZOLE 500 MG/100ML IV SOLN: 500.0000 mg | Freq: Three times a day (TID) | INTRAVENOUS | Status: DC

## 2020-08-09 MED ORDER — ACETAMINOPHEN 650 MG RE SUPP: 650.0000 mg | Freq: Four times a day (QID) | RECTAL | Status: DC | PRN

## 2020-08-09 MED ORDER — ASPIRIN EC 81 MG PO TBEC
81.0000 mg | DELAYED_RELEASE_TABLET | Freq: Every day | ORAL | Status: DC
  Administered 2020-08-09 – 2020-08-15 (×5): 81 mg via ORAL
  Filled 2020-08-09 (×5): qty 1

## 2020-08-09 MED ORDER — SODIUM ZIRCONIUM CYCLOSILICATE 10 G PO PACK
10.0000 g | PACK | Freq: Once | ORAL | Status: AC
  Administered 2020-08-09: 10 g via ORAL
  Filled 2020-08-09 (×2): qty 1

## 2020-08-09 NOTE — ED Notes (Signed)
Patient's right foot wrapped and medication applied per MD ordered.

## 2020-08-09 NOTE — ED Notes (Signed)
Lindsay RN aware of assigned bed 
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
## 2020-08-09 NOTE — ED Notes (Signed)
Brianna RN aware of assigned bed 

## 2020-08-09 NOTE — ED Notes (Signed)
cbg 134

## 2020-08-09 NOTE — ED Triage Notes (Signed)
Pt states he was sent from podiatrist for infection in there right foot at the great toe and the 5th toe
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
## 2020-08-09 NOTE — Consult Note (Addendum)
WOC Nurse Consult Note: Reason for Consult: Consult requested for right foot wounds.  Performed remotely after rerview of progress notes and photos in the EMR.  MRI indicates: "Confluent low T1 signal changes within the first metatarsal head and neck compatible with acute osteomyelitis. Bone marrow edema throughout the great toe proximal phalanx with intermediate T1 signal change at the proximal phalanx base concerning for early acute osteomyelitis. Small first MTP joint effusion, likely septic arthritis. This complex medical condition is beyond the scope of practice for WOC nurses; please consult ortho or podiatry service for further plan of care. 3 full thickness wounds to right foot: Anterior middle foot 100% red and dry Inner foot 100% yellow slough with mod amt yellow drainage Outer foot 85% yellow slough, 15% red and moist Generalized edema and erythemia to right foot. Topical treatment orders provided for bedside nurses to perform as follows until further input from podiatry or ortho team to assist with enzymatic debridement: Apply Santyl to right inner and outer foot wounds Q day, then cover with moist gauze dressing and kerlex.   Apply xeroform gauze to right anterior foot wound Q day, then cover with 4X4 and kerlex. Please re-consult if further assistance is needed.  Thank-you,  Cammie Mcgee MSN, RN, CWOCN, [place], CNS [phone]
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
## 2020-08-10 ENCOUNTER — Encounter
Admission: EM | Disposition: A | Discharge status: Home / Self Care | Payer: Self-pay | Source: Home / Self Care | Attending: Internal Medicine

## 2020-08-10 DIAGNOSIS — L97519 Non-pressure chronic ulcer of other part of right foot with unspecified severity: Secondary | ICD-10-CM

## 2020-08-10 DIAGNOSIS — M86171 Other acute osteomyelitis, right ankle and foot: Secondary | ICD-10-CM

## 2020-08-10 DIAGNOSIS — I70235 Atherosclerosis of native arteries of right leg with ulceration of other part of foot: Secondary | ICD-10-CM

## 2020-08-10 HISTORY — PX: LOWER EXTREMITY ANGIOGRAPHY: CATH118251

## 2020-08-10 LAB — GLUCOSE, CAPILLARY
Glucose-Capillary: 134 mg/dL — ABNORMAL HIGH (ref 70–99)
Glucose-Capillary: 93 mg/dL (ref 70–99)
Glucose-Capillary: 209 mg/dL — ABNORMAL HIGH (ref 70–99)
Glucose-Capillary: 137 mg/dL — ABNORMAL HIGH (ref 70–99)
Glucose-Capillary: 147 mg/dL — ABNORMAL HIGH (ref 70–99)
Glucose-Capillary: 122 mg/dL — ABNORMAL HIGH (ref 70–99)
Glucose-Capillary: 178 mg/dL — ABNORMAL HIGH (ref 70–99)

## 2020-08-10 LAB — CBC
HCT: 37 % — ABNORMAL LOW (ref 39.0–52.0)
Hemoglobin: 12.7 g/dL — ABNORMAL LOW (ref 13.0–17.0)
MCH: 30.3 pg (ref 26.0–34.0)
MCHC: 34.3 g/dL (ref 30.0–36.0)
MCV: 88.3 fL (ref 80.0–100.0)
Platelets: 195 10*3/uL (ref 150–400)
RBC: 4.19 MIL/uL — ABNORMAL LOW (ref 4.22–5.81)
RDW: 12.6 % (ref 11.5–15.5)
WBC: 4.6 10*3/uL (ref 4.0–10.5)
nRBC: 0 % (ref 0.0–0.2)

## 2020-08-10 LAB — BASIC METABOLIC PANEL
Anion gap: 4 — ABNORMAL LOW (ref 5–15)
BUN: 23 mg/dL — ABNORMAL HIGH (ref 6–20)
CO2: 28 mmol/L (ref 22–32)
Calcium: 8.8 mg/dL — ABNORMAL LOW (ref 8.9–10.3)
Chloride: 106 mmol/L (ref 98–111)
Creatinine, Ser: 0.94 mg/dL (ref 0.61–1.24)
GFR, Estimated: >60 mL/min (ref >60)
Glucose, Bld: 151 mg/dL — ABNORMAL HIGH (ref 70–99)
Potassium: 4.6 mmol/L (ref 3.5–5.1)
Sodium: 138 mmol/L (ref 135–145)

## 2020-08-10 LAB — C-REACTIVE PROTEIN: CRP: 0.8 mg/dL (ref <1.0)

## 2020-08-10 LAB — PREALBUMIN: Prealbumin: 21.8 mg/dL (ref 18–38)

## 2020-08-10 SURGERY — LOWER EXTREMITY ANGIOGRAPHY
Anesthesia: Moderate Sedation | Laterality: Right

## 2020-08-10 MED ORDER — JUVEN PO PACK
1.0000 | PACK | Freq: Two times a day (BID) | ORAL | Status: DC
  Administered 2020-08-12 – 2020-08-14 (×5): 1 via ORAL

## 2020-08-10 MED ORDER — HYDROMORPHONE HCL 1 MG/ML IJ SOLN: 1.0000 mg | Freq: Once | INTRAMUSCULAR | Status: DC | PRN

## 2020-08-10 MED ORDER — FENTANYL CITRATE (PF) 100 MCG/2ML IJ SOLN
INTRAMUSCULAR | Status: AC
  Filled 2020-08-10: qty 2

## 2020-08-10 MED ORDER — MIDAZOLAM HCL 2 MG/2ML IJ SOLN
INTRAMUSCULAR | Status: DC | PRN
Start: 2020-08-10 — End: 2020-08-10
  Administered 2020-08-10: 2 mg via INTRAVENOUS
  Administered 2020-08-10: 1 mg via INTRAVENOUS

## 2020-08-10 MED ORDER — IODIXANOL 320 MG/ML IV SOLN
INTRAVENOUS | Status: DC | PRN
  Administered 2020-08-10: 45 mL

## 2020-08-10 MED ORDER — HEPARIN SODIUM (PORCINE) 1000 UNIT/ML IJ SOLN
INTRAMUSCULAR | Status: AC
  Filled 2020-08-10: qty 1

## 2020-08-10 MED ORDER — MIDAZOLAM HCL 2 MG/2ML IJ SOLN
INTRAMUSCULAR | Status: AC
  Filled 2020-08-10: qty 2

## 2020-08-10 MED ORDER — FENTANYL CITRATE (PF) 100 MCG/2ML IJ SOLN
INTRAMUSCULAR | Status: DC | PRN
  Administered 2020-08-10: 25 ug via INTRAVENOUS
  Administered 2020-08-10: 50 ug via INTRAVENOUS

## 2020-08-10 MED ORDER — CEFAZOLIN SODIUM-DEXTROSE 1-4 GM/50ML-% IV SOLN: 1.0000 g | Freq: Once | INTRAVENOUS | Status: DC

## 2020-08-10 MED ORDER — FAMOTIDINE 20 MG PO TABS: 40.0000 mg | ORAL_TABLET | Freq: Once | ORAL | Status: DC | PRN

## 2020-08-10 MED ORDER — DIPHENHYDRAMINE HCL 50 MG/ML IJ SOLN: 50.0000 mg | Freq: Once | INTRAMUSCULAR | Status: DC | PRN

## 2020-08-10 MED ORDER — SODIUM CHLORIDE 0.9 % IV SOLN: INTRAVENOUS | Status: DC

## 2020-08-10 MED ORDER — METHYLPREDNISOLONE SODIUM SUCC 125 MG IJ SOLR: 125.0000 mg | Freq: Once | INTRAMUSCULAR | Status: DC | PRN

## 2020-08-10 MED ORDER — HEPARIN SODIUM (PORCINE) 1000 UNIT/ML IJ SOLN
INTRAMUSCULAR | Status: DC | PRN
  Administered 2020-08-10: 5000 [IU] via INTRAVENOUS

## 2020-08-10 MED ORDER — ASCORBIC ACID 500 MG PO TABS
250.0000 mg | ORAL_TABLET | Freq: Two times a day (BID) | ORAL | Status: DC
  Administered 2020-08-11 – 2020-08-15 (×8): 250 mg via ORAL
  Filled 2020-08-10 (×9): qty 1

## 2020-08-10 MED ORDER — ONDANSETRON HCL 4 MG/2ML IJ SOLN: 4.0000 mg | Freq: Four times a day (QID) | INTRAMUSCULAR | Status: DC | PRN

## 2020-08-10 MED ORDER — MIDAZOLAM HCL 2 MG/ML PO SYRP: 8.0000 mg | ORAL_SOLUTION | Freq: Once | ORAL | Status: DC | PRN

## 2020-08-10 SURGICAL SUPPLY — 24 items
BALLN LUTONIX  018 4X60X130 (BALLOONS) ×1
BALLN LUTONIX 018 4X60X130 (BALLOONS) ×1
BALLN ULTRVRSE 2X300X150 (BALLOONS) ×1
BALLN ULTRVRSE 2X300X150 OTW (BALLOONS) ×1
BALLOON LUTONIX 018 4X60X130 (BALLOONS) ×1 IMPLANT
BALLOON ULTRVRSE 2X300X150 OTW (BALLOONS) ×1 IMPLANT
CATH 0.018 NAVICROSS ANG 135 (CATHETERS) ×2 IMPLANT
CATH ANGIO 5F PIGTAIL 65CM (CATHETERS) ×2 IMPLANT
CATH NAVICROSS ANGLED 135CM (MICROCATHETER) ×2 IMPLANT
CATH VERT 5X100 (CATHETERS) ×2 IMPLANT
COVER PROBE U/S 5X48 (MISCELLANEOUS) ×2 IMPLANT
DEVICE STARCLOSE SE CLOSURE (Vascular Products) ×2 IMPLANT
GLIDEWIRE ADV .035X260CM (WIRE) ×2 IMPLANT
GUIDEWIRE PFTE-COATED .018X300 (WIRE) ×2 IMPLANT
KIT ENCORE 26 ADVANTAGE (KITS) ×2 IMPLANT
KIT MICROPUNCTURE NIT STIFF (SHEATH) ×2 IMPLANT
NEEDLE ENTRY 21GA 7CM ECHOTIP (NEEDLE) ×2 IMPLANT
PACK ANGIOGRAPHY (CUSTOM PROCEDURE TRAY) ×2 IMPLANT
SHEATH ANL2 6FRX45 HC (SHEATH) ×2 IMPLANT
SHEATH BRITE TIP 5FRX11 (SHEATH) ×2 IMPLANT
SYR MEDRAD MARK 7 150ML (SYRINGE) ×2 IMPLANT
TUBING CONTRAST HIGH PRESS 72 (TUBING) ×2 IMPLANT
WIRE G V18X300CM (WIRE) ×2 IMPLANT
WIRE GUIDERIGHT .035X150 (WIRE) ×2 IMPLANT
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
## 2020-08-10 NOTE — Progress Notes (Signed)
Day of Surgery   Subjective/Chief Complaint: Patient seen.  Preparing to go to angio for intervention.   Objective: Vital signs in last 24 hours: Temp:  [97.5 F (36.4 C)-98.6 F (37 C)] 98.2 F (36.8 C) (07/13 1554) Pulse Rate:  [70-80] 74 (07/13 1740) Resp:  [12-18] 16 (07/13 1740) BP: (138-173)/(71-93) 140/71 (07/13 1740) SpO2:  [98 %-100 %] 100 % (07/13 1740) Last BM Date: 08/09/20  Intake/Output from previous day: 07/12 0701 - 07/13 0700 In: 505.6 [P.O.:240; IV Piggyback:265.6] Out: -  Intake/Output this shift: No intake/output data recorded.  Dressings intact.  Wounds not assessed.  Lab Results:  Recent Labs    08/09/20 0910 08/10/20 0455  WBC 6.6 4.6  HGB 14.5 12.7*  HCT 42.7 37.0*  PLT 233 195   BMET Recent Labs    08/09/20 0910 08/10/20 0455  NA 136 138  K 5.3* 4.6  CL 103 106  CO2 26 28  GLUCOSE 138* 151*  BUN 32* 23*  CREATININE 1.32* 0.94  CALCIUM 9.5 8.8*   PT/INR No results for input(s): LABPROT, INR in the last 72 hours. ABG No results for input(s): PHART, HCO3 in the last 72 hours.  Invalid input(s): PCO2, PO2  Studies/Results: MRI Right foot without contrast  Result Date: 08/09/2020 CLINICAL DATA:  Diabetic right foot infection EXAM: MRI OF THE RIGHT FOREFOOT WITHOUT CONTRAST TECHNIQUE: Multiplanar, multisequence MR imaging of the right forefoot was performed. No intravenous contrast was administered. COMPARISON:  None. FINDINGS: Bones/Joint/Cartilage Large erosion involving the medial aspect of the first metatarsal head with extensive bone marrow edema throughout the first metatarsal head and shaft extending to the proximal metaphysis. Confluent low T1 signal changes within the first metatarsal head and neck compatible with acute osteomyelitis. Bone marrow edema throughout the great toe proximal phalanx with intermediate T1 signal change at the proximal phalanx base concerning for early acute osteomyelitis. Small first MTP joint effusion,  likely septic arthritis. Extensive bone marrow edema within the fifth toe proximal phalanx and fifth metatarsal head/neck with intermediate T1 marrow signal most compatible with acute osteomyelitis. Bone marrow edema within the fifth metatarsal extends the level of the mid diaphysis. No additional sites of acute osteomyelitis are seen within the right forefoot. No fracture or dislocation. Ligaments Intact Lisfranc ligament. Capsular edema of the first and fifth MTP joints. Muscles and Tendons Findings of myositis and/or denervation of the intrinsic foot musculature. Grossly intact flexor and extensor tendons without tenosynovitis. Soft tissues Prominent soft tissue ulceration along the medial margin of the forefoot at the level of the first metatarsal head with surrounding edema. Additional ulceration at the lateral aspect of the forefoot adjacent to the fifth MTP joint. No organized or drainable fluid collections. IMPRESSION: 1. Soft tissue ulceration with acute osteomyelitis of the first metatarsal head and neck with prominent erosion of the first metatarsal head. 2. Findings suggestive of early acute osteomyelitis of the base of the great toe proximal phalanx. 3. Small first MTP joint effusion, likely septic arthritis. 4. Soft tissue ulceration with acute osteomyelitis of the right fifth toe proximal phalanx and fifth metatarsal head/neck. 5. Findings of cellulitis and myositis. No organized or drainable fluid collections. Electronically Signed   By: Duanne Guess D.O.   On: 08/09/2020 11:06    Anti-infectives: Anti-infectives (From admission, onward)    Start     Dose/Rate Route Frequency Ordered Stop   08/11/20 0000  ceFAZolin (ANCEF) IVPB 1 g/50 mL premix       Note to Pharmacy: To  be given in specials   1 g 100 mL/hr over 30 Minutes Intravenous  Once 08/10/20 1704     08/09/20 1000  cefTRIAXone (ROCEPHIN) 2 g in sodium chloride 0.9 % 100 mL IVPB  Status:  Discontinued       See Hyperspace for  full Linked Orders Report.   2 g 200 mL/hr over 30 Minutes Intravenous Every 24 hours 08/09/20 0958 08/09/20 0958   08/09/20 1000  metroNIDAZOLE (FLAGYL) IVPB 500 mg  Status:  Discontinued       See Hyperspace for full Linked Orders Report.   500 mg 100 mL/hr over 60 Minutes Intravenous Every 8 hours 08/09/20 0958 08/09/20 0958   08/09/20 0915  [MAR Hold]  cefTRIAXone (ROCEPHIN) 2 g in sodium chloride 0.9 % 100 mL IVPB        (MAR Hold since Wed 08/10/2020 at 1635.Hold Reason: Transfer to a Procedural area)  See Hyperspace for full Linked Orders Report.   2 g 200 mL/hr over 30 Minutes Intravenous Every 24 hours 08/09/20 0909     08/09/20 0915  [MAR Hold]  metroNIDAZOLE (FLAGYL) IVPB 500 mg        (MAR Hold since Wed 08/10/2020 at 1635.Hold Reason: Transfer to a Procedural area)  See Hyperspace for full Linked Orders Report.   500 mg 100 mL/hr over 60 Minutes Intravenous Every 8 hours 08/09/20 0909         Assessment/Plan: s/p Procedure(s): Lower Extremity Angiography (Right) Assessment: Osteomyelitis right first and fifth metatarsals.  Plan: Discussed again with the patient surgery planned which at this point we have moved to tomorrow afternoon.  Obtain consent for debridement infected soft tissue and bone right foot with possible ray resections.  N.p.o. after breakfast tomorrow.  Plan for surgery tomorrow evening  LOS: 1 day    [name] 08/10/2020

## 2020-08-10 NOTE — Plan of Care (Signed)
No acute events overnight. Pt believes positive Covid result is due to previous Covid infection in May 2022. Precautions taken until ordered otherwise. Problem: Education: Goal: Knowledge of General Education information will improve Description: Including pain rating scale, medication(s)/side effects and non-pharmacologic comfort measures Outcome: Progressing   Problem: Health Behavior/Discharge Planning: Goal: Ability to manage health-related needs will improve Outcome: Progressing   Problem: Clinical Measurements: Goal: Ability to maintain clinical measurements within normal limits will improve Outcome: Progressing Goal: Will remain free from infection Outcome: Progressing Goal: Diagnostic test results will improve Outcome: Progressing Goal: Respiratory complications will improve Outcome: Progressing Goal: Cardiovascular complication will be avoided Outcome: Progressing   Problem: Activity: Goal: Risk for activity intolerance will decrease Outcome: Progressing   Problem: Nutrition: Goal: Adequate nutrition will be maintained Outcome: Progressing   Problem: Coping: Goal: Level of anxiety will decrease Outcome: Progressing   Problem: Elimination: Goal: Will not experience complications related to bowel motility Outcome: Progressing Goal: Will not experience complications related to urinary retention Outcome: Progressing   Problem: Pain Managment: Goal: General experience of comfort will improve Outcome: Progressing   Problem: Safety: Goal: Ability to remain free from injury will improve Outcome: Progressing   Problem: Skin Integrity: Goal: Risk for impaired skin integrity will decrease Outcome: Progressing

## 2020-08-10 NOTE — Op Note (Signed)
[facility]  Percutaneous Study/Intervention Procedural Note   Date of Surgery: 08/10/2020  Surgeon(s):Bruin Bolger    Assistants:none  Pre-operative Diagnosis: PAD with ulceration right lower extremity  Post-operative diagnosis:  Same  Procedure(s) Performed:             1.  Ultrasound guidance for vascular access left femoral artery             2.  Catheter placement into right common femoral artery from left femoral approach             3.  Aortogram and selective right lower extremity angiogram             4.  Percutaneous transluminal angioplasty of right peroneal artery with 4 mm diameter Lutonix drug-coated angioplasty balloon             5.  Percutaneous transluminal angioplasty right posterior tibial artery with 2 mm diameter by 30 cm length angioplasty balloon  6.  StarClose closure device left femoral artery  EBL: 10 cc  Contrast: 45 cc  Fluoro Time: 8.2 minutes  Moderate Conscious Sedation Time: approximately 57 minutes using 3 mg of Versed and 75 mcg of Fentanyl              Indications:  Patient is a 60 y.o.male with persistent ulcerations on the right foot.  He has previously undergone revascularization of both lower extremities for ulceration. The patient is brought in for angiography for further evaluation and potential treatment.  Due to the limb threatening nature of the situation, angiogram was performed for attempted limb salvage. The patient is aware that if the procedure fails, amputation would be expected.  The patient also understands that even with successful revascularization, amputation may still be required due to the severity of the situation.  Risks and benefits are discussed and informed consent is obtained.   Procedure:  The patient was identified and appropriate procedural time out was performed.  The patient was then placed supine on the table and prepped and draped in the usual sterile fashion. Moderate conscious sedation was  administered during a face to face encounter with the patient throughout the procedure with my supervision of the RN administering medicines and monitoring the patient's vital signs, pulse oximetry, telemetry and mental status throughout from the start of the procedure until the patient was taken to the recovery room. Ultrasound was used to evaluate the left common femoral artery.  It was patent .  A digital ultrasound image was acquired.  A Seldinger needle was used to access the left common femoral artery under direct ultrasound guidance and a permanent image was performed.  A 0.035 J wire was advanced without resistance and a 5Fr sheath was placed.  Pigtail catheter was placed into the aorta and an AP aortogram was performed. This demonstrated normal left renal artery , the right renal artery was not seen, and normal aorta and iliac segments without significant stenosis. I then crossed the aortic bifurcation and advanced to the right femoral head. Selective right lower extremity angiogram was then performed. This demonstrated normal common femoral artery, profunda femoris artery, superficial femoral and popliteal arteries.  There is a typical tibial trifurcation.  The anterior tibial artery was occluded proximally without significant distal reconstitution.  The peroneal artery had about a 70% stenosis in the proximal to mid segment and was the dominant runoff distally.  The posterior tibial artery had multiple areas of occlusion throughout its course but did have collaterals filling  in the foot. It was felt that it was in the patient's best interest to proceed with intervention after these images to avoid a second procedure and a larger amount of contrast and fluoroscopy based off of the findings from the initial angiogram. The patient was systemically heparinized and a 6 [place] [employer] sheath was then placed over the [employer] wire. I then used a Kumpe catheter and the advantage wire to get down into the  peroneal artery where imaging was performed then I exchanged for a V 18 wire and cross the stenosis in the peroneal artery without difficulty.  A 4 mm diameter by 6 cm length Lutonix drug-coated angioplasty balloon was inflated to 6 atm for 1 minute in the proximal to mid peroneal artery to encompass the short segment stenosis.  Completion imaging showed about a 20 to 25% residual stenosis in the peroneal artery.  I then turned my attention to the posterior tibial artery.  I was able to get a wire and catheter down to the level of the ankle, but could never get intraluminal in the foot across the occlusion at the ankle.  Multiple different catheters and wires including a 0.018 advantage wire and a small Nava cross catheter were used.  I elected to balloon down to just above the ankle to improve the collateral blood flow into the foot.  A 2 mm diameter by 30 cm length angioplasty balloon was inflated to 16 atm for 1 minute in the right posterior tibial artery.  Completion imaging showed the areas to be treated to have less than 30% residual stenosis and there was improved collateral flow into the foot with the peroneal artery still being the dominant runoff distally. I elected to terminate the procedure. The sheath was removed and StarClose closure device was deployed in the left femoral artery with excellent hemostatic result. The patient was taken to the recovery room in stable condition having tolerated the procedure well.  Findings:               Aortogram: Right renal artery was not seen.  Left renal artery was widely patent.  Aorta and iliac arteries were widely patent without significant stenosis.             Right lower Extremity:  This demonstrated normal common femoral artery, profunda femoris artery, superficial femoral and popliteal arteries.  There is a typical tibial trifurcation.  The anterior tibial artery was occluded proximally without significant distal reconstitution.  The peroneal artery had  about a 70% stenosis in the proximal to mid segment and was the dominant runoff distally.  The posterior tibial artery had multiple areas of occlusion throughout its course but did have collaterals filling in the foot.   Disposition: Patient was taken to the recovery room in stable condition having tolerated the procedure well.  Complications: None  Leotis Pain 08/10/2020 5:43 PM   This note was created with Dragon Medical transcription system. Any errors in dictation are purely unintentional.
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
## 2020-08-11 ENCOUNTER — Inpatient Hospital Stay: Payer: 59 | Admitting: Anesthesiology

## 2020-08-11 ENCOUNTER — Encounter
Admission: EM | Disposition: A | Discharge status: Home / Self Care | Payer: Self-pay | Source: Home / Self Care | Attending: Internal Medicine

## 2020-08-11 ENCOUNTER — Encounter: Payer: Self-pay | Admitting: Vascular Surgery

## 2020-08-11 DIAGNOSIS — M86171 Other acute osteomyelitis, right ankle and foot: Secondary | ICD-10-CM | POA: Diagnosis not present

## 2020-08-11 HISTORY — PX: AMPUTATION: SHX166

## 2020-08-11 LAB — CBC WITH DIFFERENTIAL/PLATELET
Abs Immature Granulocytes: 0.02 10*3/uL (ref 0.00–0.07)
Basophils Absolute: 0 10*3/uL (ref 0.0–0.1)
Basophils Relative: 1 %
Eosinophils Absolute: 0.1 10*3/uL (ref 0.0–0.5)
Eosinophils Relative: 1 %
HCT: 36.2 % — ABNORMAL LOW (ref 39.0–52.0)
Hemoglobin: 12.1 g/dL — ABNORMAL LOW (ref 13.0–17.0)
Immature Granulocytes: 0 %
Lymphocytes Relative: 30 %
Lymphs Abs: 1.7 10*3/uL (ref 0.7–4.0)
MCH: 29.2 pg (ref 26.0–34.0)
MCHC: 33.4 g/dL (ref 30.0–36.0)
MCV: 87.2 fL (ref 80.0–100.0)
Monocytes Absolute: 0.5 10*3/uL (ref 0.1–1.0)
Monocytes Relative: 9 %
Neutro Abs: 3.3 10*3/uL (ref 1.7–7.7)
Neutrophils Relative %: 59 %
Platelets: 194 10*3/uL (ref 150–400)
RBC: 4.15 MIL/uL — ABNORMAL LOW (ref 4.22–5.81)
RDW: 12.4 % (ref 11.5–15.5)
WBC: 5.6 10*3/uL (ref 4.0–10.5)
nRBC: 0 % (ref 0.0–0.2)

## 2020-08-11 LAB — GLUCOSE, CAPILLARY
Glucose-Capillary: 141 mg/dL — ABNORMAL HIGH (ref 70–99)
Glucose-Capillary: 193 mg/dL — ABNORMAL HIGH (ref 70–99)
Glucose-Capillary: 160 mg/dL — ABNORMAL HIGH (ref 70–99)
Glucose-Capillary: 110 mg/dL — ABNORMAL HIGH (ref 70–99)
Glucose-Capillary: 134 mg/dL — ABNORMAL HIGH (ref 70–99)
Glucose-Capillary: 239 mg/dL — ABNORMAL HIGH (ref 70–99)

## 2020-08-11 LAB — BASIC METABOLIC PANEL
Anion gap: 7 (ref 5–15)
BUN: 18 mg/dL (ref 6–20)
CO2: 25 mmol/L (ref 22–32)
Calcium: 8.4 mg/dL — ABNORMAL LOW (ref 8.9–10.3)
Chloride: 105 mmol/L (ref 98–111)
Creatinine, Ser: 1.06 mg/dL (ref 0.61–1.24)
GFR, Estimated: >60 mL/min (ref >60)
Glucose, Bld: 159 mg/dL — ABNORMAL HIGH (ref 70–99)
Potassium: 4.3 mmol/L (ref 3.5–5.1)
Sodium: 137 mmol/L (ref 135–145)

## 2020-08-11 SURGERY — AMPUTATION, FOOT, RAY
Anesthesia: General | Laterality: Right

## 2020-08-11 MED ORDER — FENTANYL CITRATE (PF) 100 MCG/2ML IJ SOLN
INTRAMUSCULAR | Status: DC | PRN
  Administered 2020-08-11 (×4): 50 ug via INTRAVENOUS

## 2020-08-11 MED ORDER — VANCOMYCIN HCL 1000 MG IV SOLR
INTRAVENOUS | Status: AC
  Filled 2020-08-11: qty 2000

## 2020-08-11 MED ORDER — LIDOCAINE HCL (PF) 2 % IJ SOLN
INTRAMUSCULAR | Status: DC | PRN
  Administered 2020-08-11: 50 mg

## 2020-08-11 MED ORDER — PROPOFOL 10 MG/ML IV BOLUS
INTRAVENOUS | Status: DC | PRN
  Administered 2020-08-11: 30 mg via INTRAVENOUS
  Administered 2020-08-11: 120 mg via INTRAVENOUS

## 2020-08-11 MED ORDER — VANCOMYCIN HCL 1000 MG IV SOLR
INTRAVENOUS | Status: DC | PRN
  Administered 2020-08-11: 1000 mg

## 2020-08-11 MED ORDER — ONDANSETRON HCL 4 MG/2ML IJ SOLN: 4.0000 mg | Freq: Once | INTRAMUSCULAR | Status: DC | PRN

## 2020-08-11 MED ORDER — NEOMYCIN-POLYMYXIN B GU 40-200000 IR SOLN
Status: DC | PRN
  Administered 2020-08-11: 2 mL

## 2020-08-11 MED ORDER — GLYCOPYRROLATE 0.2 MG/ML IJ SOLN
INTRAMUSCULAR | Status: AC
  Filled 2020-08-11: qty 1

## 2020-08-11 MED ORDER — LIDOCAINE HCL (PF) 2 % IJ SOLN
INTRAMUSCULAR | Status: AC
  Filled 2020-08-11: qty 5

## 2020-08-11 MED ORDER — EPHEDRINE 5 MG/ML INJ
INTRAVENOUS | Status: AC
  Filled 2020-08-11: qty 10

## 2020-08-11 MED ORDER — ACETAMINOPHEN 10 MG/ML IV SOLN: 1000.0000 mg | Freq: Once | INTRAVENOUS | Status: DC | PRN

## 2020-08-11 MED ORDER — LABETALOL HCL 5 MG/ML IV SOLN: 20.0000 mg | INTRAVENOUS | Status: DC | PRN

## 2020-08-11 MED ORDER — PROPOFOL 10 MG/ML IV BOLUS
INTRAVENOUS | Status: AC
  Filled 2020-08-11: qty 20

## 2020-08-11 MED ORDER — MIDAZOLAM HCL 5 MG/5ML IJ SOLN
INTRAMUSCULAR | Status: DC | PRN
  Administered 2020-08-11: 2 mg via INTRAVENOUS

## 2020-08-11 MED ORDER — LISINOPRIL 10 MG PO TABS
10.0000 mg | ORAL_TABLET | Freq: Every day | ORAL | Status: DC
  Administered 2020-08-12 – 2020-08-13 (×2): 10 mg via ORAL
  Filled 2020-08-11 (×2): qty 1

## 2020-08-11 MED ORDER — GLYCOPYRROLATE 0.2 MG/ML IJ SOLN
INTRAMUSCULAR | Status: DC | PRN
  Administered 2020-08-11: .2 mg via INTRAVENOUS

## 2020-08-11 MED ORDER — ONDANSETRON HCL 4 MG/2ML IJ SOLN
INTRAMUSCULAR | Status: DC | PRN
  Administered 2020-08-11: 4 mg via INTRAVENOUS

## 2020-08-11 MED ORDER — ONDANSETRON HCL 4 MG/2ML IJ SOLN
INTRAMUSCULAR | Status: AC
  Filled 2020-08-11: qty 2

## 2020-08-11 MED ORDER — DEXAMETHASONE SODIUM PHOSPHATE 10 MG/ML IJ SOLN
INTRAMUSCULAR | Status: DC | PRN
  Administered 2020-08-11: 4 mg via INTRAVENOUS

## 2020-08-11 MED ORDER — SEVOFLURANE IN SOLN
RESPIRATORY_TRACT | Status: AC
  Filled 2020-08-11: qty 250

## 2020-08-11 MED ORDER — OXYCODONE-ACETAMINOPHEN 5-325 MG PO TABS: 1.0000 | ORAL_TABLET | ORAL | Status: DC | PRN

## 2020-08-11 MED ORDER — 0.9 % SODIUM CHLORIDE (POUR BTL) OPTIME
TOPICAL | Status: DC | PRN
  Administered 2020-08-11 (×3): 500 mL

## 2020-08-11 MED ORDER — EPHEDRINE SULFATE 50 MG/ML IJ SOLN
INTRAMUSCULAR | Status: DC | PRN
  Administered 2020-08-11: 10 mg via INTRAVENOUS
  Administered 2020-08-11: 5 mg via INTRAVENOUS
  Administered 2020-08-11: 10 mg via INTRAVENOUS

## 2020-08-11 MED ORDER — OXYCODONE HCL 5 MG PO TABS: 5.0000 mg | ORAL_TABLET | Freq: Once | ORAL | Status: DC | PRN

## 2020-08-11 MED ORDER — POVIDONE-IODINE 10 % EX SOLN
CUTANEOUS | Status: DC | PRN
  Administered 2020-08-11: 1 via TOPICAL

## 2020-08-11 MED ORDER — FENTANYL CITRATE (PF) 100 MCG/2ML IJ SOLN: 25.0000 ug | INTRAMUSCULAR | Status: DC | PRN

## 2020-08-11 MED ORDER — FENTANYL CITRATE (PF) 100 MCG/2ML IJ SOLN
INTRAMUSCULAR | Status: AC
  Filled 2020-08-11: qty 2

## 2020-08-11 MED ORDER — PHENYLEPHRINE HCL (PRESSORS) 10 MG/ML IV SOLN
INTRAVENOUS | Status: DC | PRN
  Administered 2020-08-11 (×2): 100 ug via INTRAVENOUS
  Administered 2020-08-11: 200 ug via INTRAVENOUS
  Administered 2020-08-11 (×4): 100 ug via INTRAVENOUS
  Administered 2020-08-11: 200 ug via INTRAVENOUS

## 2020-08-11 MED ORDER — OXYCODONE HCL 5 MG/5ML PO SOLN: 5.0000 mg | Freq: Once | ORAL | Status: DC | PRN

## 2020-08-11 MED ORDER — DEXAMETHASONE SODIUM PHOSPHATE 10 MG/ML IJ SOLN
INTRAMUSCULAR | Status: AC
  Filled 2020-08-11: qty 1

## 2020-08-11 MED ORDER — MORPHINE SULFATE (PF) 2 MG/ML IV SOLN: 2.0000 mg | INTRAVENOUS | Status: DC | PRN

## 2020-08-11 MED ORDER — HYDRALAZINE HCL 20 MG/ML IJ SOLN
10.0000 mg | Freq: Four times a day (QID) | INTRAMUSCULAR | Status: DC | PRN
  Administered 2020-08-11: 10 mg via INTRAVENOUS
  Filled 2020-08-11: qty 1

## 2020-08-11 MED ORDER — MIDAZOLAM HCL 2 MG/2ML IJ SOLN
INTRAMUSCULAR | Status: AC
  Filled 2020-08-11: qty 2

## 2020-08-11 SURGICAL SUPPLY — 53 items
BLADE MED AGGRESSIVE (BLADE) ×1 IMPLANT
BLADE OSC/SAGITTAL MD 5.5X18 (BLADE) ×2 IMPLANT
BLADE SURG 15 STRL LF DISP TIS (BLADE) ×2 IMPLANT
BLADE SURG 15 STRL SS (BLADE) ×3
BLADE SURG MINI STRL (BLADE) ×2 IMPLANT
BNDG CONFORM 2 STRL LF (GAUZE/BANDAGES/DRESSINGS) ×1 IMPLANT
BNDG CONFORM 3 STRL LF (GAUZE/BANDAGES/DRESSINGS) ×1 IMPLANT
BNDG ELASTIC 4X5.8 VLCR STR LF (GAUZE/BANDAGES/DRESSINGS) ×2 IMPLANT
BNDG ESMARK 4X12 TAN STRL LF (GAUZE/BANDAGES/DRESSINGS) ×2 IMPLANT
BNDG GAUZE ELAST 4 BULKY (GAUZE/BANDAGES/DRESSINGS) ×3 IMPLANT
CNTNR SPEC 2.5X3XGRAD LEK (MISCELLANEOUS) ×2
CONT SPEC 4OZ STER OR WHT (MISCELLANEOUS) ×2
CONTAINER SPEC 2.5X3XGRAD LEK (MISCELLANEOUS) IMPLANT
CUFF TOURN SGL QUICK 12 (TOURNIQUET CUFF) ×2 IMPLANT
CUFF TOURN SGL QUICK 18X4 (TOURNIQUET CUFF) ×1 IMPLANT
DRAPE FLUOR MINI C-ARM 54X84 (DRAPES) ×1 IMPLANT
DURAPREP 26ML APPLICATOR (WOUND CARE) ×2 IMPLANT
ELECT REM PT RETURN 9FT ADLT (ELECTROSURGICAL) ×2
ELECTRODE REM PT RTRN 9FT ADLT (ELECTROSURGICAL) ×1 IMPLANT
GAUZE 4X4 16PLY ~~LOC~~+RFID DBL (SPONGE) ×2 IMPLANT
GAUZE SPONGE 4X4 12PLY STRL (GAUZE/BANDAGES/DRESSINGS) ×3 IMPLANT
GAUZE XEROFORM 1X8 LF (GAUZE/BANDAGES/DRESSINGS) ×2 IMPLANT
GLOVE SURG ENC MOIS LTX SZ7.5 (GLOVE) ×2 IMPLANT
GLOVE SURG UNDER LTX SZ8 (GLOVE) ×2 IMPLANT
GOWN STRL REUS W/ TWL LRG LVL3 (GOWN DISPOSABLE) ×2 IMPLANT
GOWN STRL REUS W/TWL LRG LVL3 (GOWN DISPOSABLE) ×2
HANDPIECE VERSAJET DEBRIDEMENT (MISCELLANEOUS) ×2 IMPLANT
IV NS IRRIG 3000ML ARTHROMATIC (IV SOLUTION) ×1 IMPLANT
KIT STIMULAN RAPID CURE 5CC (Orthopedic Implant) ×1 IMPLANT
KIT TURNOVER KIT A (KITS) ×2 IMPLANT
LABEL OR SOLS (LABEL) ×2 IMPLANT
MANIFOLD NEPTUNE II (INSTRUMENTS) ×2 IMPLANT
NDL FILTER BLUNT 18X1 1/2 (NEEDLE) ×1 IMPLANT
NDL HYPO 25X1 1.5 SAFETY (NEEDLE) ×2 IMPLANT
NEEDLE FILTER BLUNT 18X 1/2SAF (NEEDLE)
NEEDLE FILTER BLUNT 18X1 1/2 (NEEDLE) IMPLANT
NEEDLE HYPO 25X1 1.5 SAFETY (NEEDLE) ×4 IMPLANT
NS IRRIG 500ML POUR BTL (IV SOLUTION) ×3 IMPLANT
PACK EXTREMITY ARMC (MISCELLANEOUS) ×2 IMPLANT
PAD ABD DERMACEA PRESS 5X9 (GAUZE/BANDAGES/DRESSINGS) ×4 IMPLANT
SOL PREP PVP 2OZ (MISCELLANEOUS) ×2
SOLUTION PREP PVP 2OZ (MISCELLANEOUS) ×1 IMPLANT
SPONGE T-LAP 18X18 ~~LOC~~+RFID (SPONGE) ×2 IMPLANT
STOCKINETTE BIAS CUT 4 980044 (GAUZE/BANDAGES/DRESSINGS) ×1 IMPLANT
STOCKINETTE STRL 6IN 960660 (GAUZE/BANDAGES/DRESSINGS) ×2 IMPLANT
STRIP CLOSURE SKIN 1/4X4 (GAUZE/BANDAGES/DRESSINGS) ×1 IMPLANT
SUT ETHILON 3-0 FS-10 30 BLK (SUTURE) ×6
SUT ETHILON 4-0 (SUTURE) ×1
SUT ETHILON 4-0 FS2 18XMFL BLK (SUTURE) ×1
SUTURE EHLN 3-0 FS-10 30 BLK (SUTURE) ×1 IMPLANT
SUTURE ETHLN 4-0 FS2 18XMF BLK (SUTURE) IMPLANT
SWAB CULTURE AMIES ANAERIB BLU (MISCELLANEOUS) ×1 IMPLANT
SYR 10ML LL (SYRINGE) ×2 IMPLANT

## 2020-08-11 NOTE — Op Note (Signed)
Date of operation: 08/11/2020.  Surgeon: [name] D.P.M.  Preoperative diagnosis: Osteomyelitis right first and fifth metatarsal phalangeal joints.  Postoperative diagnosis: Same.  Procedures: 1.  Debridement infected bone and soft tissue right first metatarsal phalangeal joint. 2.  Debridement infected bone and soft tissue right fifth metatarsal phalangeal joint.  Anesthesia: General endotracheal.  Hemostasis: None.  Estimated blood loss: 75 cc.  Pathology: 1.  First metatarsal phalangeal joint right foot. 2.  Fifth metatarsal phalangeal joint right foot.  Cultures: 1.  Bone culture right fifth metatarsal head. 2.  Bone culture right first metatarsal head.  Implants: Stimulan rapid cure antibiotic beads impregnated with vancomycin.  Drains: None.  Complications: None apparent.  Operative indications: This is a 60 year old male with chronic problems with ulcerations on his right foot.  Recently increased drainage and redness with x-rays positive for osteomyelitis in the first and fifth metatarsals and decision was made for hospitalization for IV antibiotics and debridement.  Operative procedure: Patient was taken to the operating room and placed on the table in the supine position.  Following satisfactory general anesthesia the right foot was prepped and draped in the usual sterile fashion.  Attention was directed to the lateral aspect of the right foot where a linear incision was made along the fifth metatarsal shaft extending through the ulceration and onto the base of the fifth toe.  Incision was carried sharply down to the level of the bone and dissection performed carefully away from the bone.  Fifth metatarsal shaft was incised using a sagittal saw as well as through the midportion of the proximal phalanx of the fifth toe.  The joint was then resected out in toto.  Good healthy bleeding tissues were noted.  Devitalized tissue from the ulcerative area was then sharply excised  using a 15 blade full-thickness and the entire wound was then thoroughly debrided with a Versajet debrider on a setting of 3.  Wound was thoroughly irrigated with sterile saline with bulb syringe and the incision was partially closed using 3-0 nylon simple interrupted sutures both proximal and distal with antibiotic beads then placed within the wound.  Remainder of the wound was closed using a figure-of-eight suture and 3-0 nylon simple interrupted sutures.     Attention was then directed to the medial aspect of the foot where a linear incision was made along the first metatarsal through the ulceration and extending onto the base of the great toe.  Incision was carried sharply down to the level of the bone and dissection carried off the bone.  First metatarsal was incised using a sagittal saw as well as the midportion of the proximal phalanx of the great toe.  The entire joint was then removed in toto.  Ulcerative area medially was then excisionally debrided using a 15 blade full-thickness and then the entire wound was then debrided with a Versajet debrider on a setting of 3.  The wound was flushed with copious amounts of sterile saline and proximal and distal portions of the incision was closed using 3-0 nylon simple interrupted sutures followed by packing of antibiotic beads into the central portion of the wound and the remainder of the wound was then closed using 3-0 nylon figure-of-eight suture and simple interrupted sutures.  Xeroform 4 x 4's ABDs and Kerlix applied to the right foot followed by a second Kerlix and Ace wrap.  Patient tolerated the procedure and anesthesia well was transported the PACU with vital signs stable and in good condition.

## 2020-08-11 NOTE — Transfer of Care (Signed)
Immediate Anesthesia Transfer of Care Note  Patient: Scott Howard  Procedure(s) Performed: Right 1st and 5th Ray Resection (Right)  Patient Location: PACU  Anesthesia Type:General  Level of Consciousness: sedated  Airway & Oxygen Therapy: Patient Spontanous Breathing and Patient connected to face mask oxygen  Post-op Assessment: Report given to RN and Post -op Vital signs reviewed and stable  Post vital signs: Reviewed  Last Vitals:  Vitals Value Taken Time  BP    Temp    Pulse 83 08/11/20 1846  Resp    SpO2 99 % 08/11/20 1846  Vitals shown include unvalidated device data.  Last Pain:  Vitals:   08/11/20 1630  TempSrc: Temporal  PainSc: 0-No pain      Patients Stated Pain Goal: 0 (08/11/20 1000)  Complications: No notable events documented.
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
## 2020-08-11 NOTE — Progress Notes (Signed)
Dr. Alberteen Spindle spoke to patient at bedside, reviewed procedure. Provider stated he was going to ask nurses on 1A to change dressing to left foot area tonight. Presently gauze/kerlix in place, digits exposed , warm to touch. S/p surgery 05/30/20 removed 4th and 5th toe per patient.

## 2020-08-11 NOTE — Anesthesia Postprocedure Evaluation (Signed)
Anesthesia Post Note  Patient: Scott Howard  Procedure(s) Performed: Right 1st and 5th Ray Resection (Right)  Patient location during evaluation: PACU Anesthesia Type: General Level of consciousness: awake and alert Pain management: pain level controlled Vital Signs Assessment: post-procedure vital signs reviewed and stable Respiratory status: spontaneous breathing, nonlabored ventilation, respiratory function stable and patient connected to nasal cannula oxygen Cardiovascular status: blood pressure returned to baseline and stable Postop Assessment: no apparent nausea or vomiting Anesthetic complications: no   No notable events documented.   Last Vitals:  Vitals:   08/11/20 1900 08/11/20 1915  BP: (!) 101/58 (!) 108/95  Pulse: 84 96  Resp: 18 19  Temp: (!) 36.4 C 36.7 C  SpO2: 100% 97%    Last Pain:  Vitals:   08/11/20 1915  TempSrc:   PainSc: 0-No pain                 Corinda Gubler

## 2020-08-11 NOTE — Anesthesia Preprocedure Evaluation (Signed)
Anesthesia Evaluation  Patient identified by MRN, date of birth, ID band Patient awake    Reviewed: Allergy & Precautions, NPO status , Patient's Chart, lab work & pertinent test results  History of Anesthesia Complications Negative for: history of anesthetic complications  Airway Mallampati: II  TM Distance: >3 FB Neck ROM: Full    Dental no notable dental hx. (+) Teeth Intact   Pulmonary neg sleep apnea (resolved after UPPP), neg COPD, Not current smoker,    Pulmonary exam normal breath sounds clear to auscultation       Cardiovascular hypertension, Pt. on medications + Peripheral Vascular Disease  (-) Past MI and (-) CHF (-) dysrhythmias (-) Valvular Problems/Murmurs Rhythm:Regular Rate:Normal - Systolic murmurs    Neuro/Psych neg Seizures    GI/Hepatic Neg liver ROS, neg GERD  ,  Endo/Other  diabetes, Type 2, Oral Hypoglycemic Agents  Renal/GU Renal InsufficiencyRenal disease     Musculoskeletal   Abdominal   Peds  Hematology   Anesthesia Other Findings Past Medical History: No date: Diabetes mellitus without complication (HCC) No date: Hyperlipidemia     Comment:  borderline No date: Hypertension   Reproductive/Obstetrics                             Anesthesia Physical  Anesthesia Plan  ASA: 3  Anesthesia Plan: General   Post-op Pain Management:    Induction: Intravenous  PONV Risk Score and Plan: 2 and Ondansetron, Dexamethasone and Midazolam  Airway Management Planned: LMA  Additional Equipment: None  Intra-op Plan:   Post-operative Plan: Extubation in OR  Informed Consent: I have reviewed the patients History and Physical, chart, labs and discussed the procedure including the risks, benefits and alternatives for the proposed anesthesia with the patient or authorized representative who has indicated his/her understanding and acceptance.     Dental advisory  given  Plan Discussed with: CRNA and Surgeon  Anesthesia Plan Comments: (Discussed risks of anesthesia with patient, including PONV, sore throat, lip/dental damage. Rare risks discussed as well, such as cardiorespiratory and neurological sequelae. Patient understands.)        Anesthesia Quick Evaluation

## 2020-08-11 NOTE — Anesthesia Procedure Notes (Signed)
Procedure Name: LMA Insertion Date/Time: 08/11/2020 5:19 PM Performed by: Mathews Argyle, CRNA Pre-anesthesia Checklist: Patient identified, Patient being monitored, Timeout performed, Emergency Drugs available and Suction available Patient Re-evaluated:Patient Re-evaluated prior to induction Oxygen Delivery Method: Circle system utilized Preoxygenation: Pre-oxygenation with 100% oxygen Induction Type: IV induction Ventilation: Mask ventilation without difficulty LMA: LMA inserted LMA Size: 4.0 Tube type: Oral Number of attempts: 1 Placement Confirmation: positive ETCO2 and breath sounds checked- equal and bilateral Tube secured with: Tape Dental Injury: Teeth and Oropharynx as per pre-operative assessment

## 2020-08-11 NOTE — Interval H&P Note (Signed)
History and Physical Interval Note:  08/11/2020 5:03 PM  Scott Howard  has presented today for surgery, with the diagnosis of Osteomylitis.  The various methods of treatment have been discussed with the patient and family. After consideration of risks, benefits and other options for treatment, the patient has consented to  Procedure(s): Right 1st and 5th Ray Resection (Right) as a surgical intervention.  The patient's history has been reviewed, patient examined, no change in status, stable for surgery.  I have reviewed the patient's chart and labs.  Questions were answered to the patient's satisfaction.     [name]

## 2020-08-12 ENCOUNTER — Encounter: Payer: Self-pay | Admitting: Podiatry

## 2020-08-12 DIAGNOSIS — E11628 Type 2 diabetes mellitus with other skin complications: Secondary | ICD-10-CM | POA: Diagnosis not present

## 2020-08-12 DIAGNOSIS — L089 Local infection of the skin and subcutaneous tissue, unspecified: Secondary | ICD-10-CM

## 2020-08-12 DIAGNOSIS — M86171 Other acute osteomyelitis, right ankle and foot: Secondary | ICD-10-CM | POA: Diagnosis not present

## 2020-08-12 LAB — BASIC METABOLIC PANEL
Anion gap: 5 (ref 5–15)
BUN: 16 mg/dL (ref 6–20)
CO2: 24 mmol/L (ref 22–32)
Calcium: 8.3 mg/dL — ABNORMAL LOW (ref 8.9–10.3)
Chloride: 104 mmol/L (ref 98–111)
Creatinine, Ser: 0.97 mg/dL (ref 0.61–1.24)
GFR, Estimated: >60 mL/min (ref >60)
Glucose, Bld: 306 mg/dL — ABNORMAL HIGH (ref 70–99)
Potassium: 4.4 mmol/L (ref 3.5–5.1)
Sodium: 133 mmol/L — ABNORMAL LOW (ref 135–145)

## 2020-08-12 LAB — CBC WITH DIFFERENTIAL/PLATELET
Abs Immature Granulocytes: 0.02 10*3/uL (ref 0.00–0.07)
Basophils Absolute: 0 10*3/uL (ref 0.0–0.1)
Basophils Relative: 0 %
Eosinophils Absolute: 0 10*3/uL (ref 0.0–0.5)
Eosinophils Relative: 0 %
HCT: 34.4 % — ABNORMAL LOW (ref 39.0–52.0)
Hemoglobin: 11.8 g/dL — ABNORMAL LOW (ref 13.0–17.0)
Immature Granulocytes: 0 %
Lymphocytes Relative: 11 %
Lymphs Abs: 0.6 10*3/uL — ABNORMAL LOW (ref 0.7–4.0)
MCH: 29.1 pg (ref 26.0–34.0)
MCHC: 34.3 g/dL (ref 30.0–36.0)
MCV: 84.9 fL (ref 80.0–100.0)
Monocytes Absolute: 0.2 10*3/uL (ref 0.1–1.0)
Monocytes Relative: 3 %
Neutro Abs: 4.2 10*3/uL (ref 1.7–7.7)
Neutrophils Relative %: 86 %
Platelets: 193 10*3/uL (ref 150–400)
RBC: 4.05 MIL/uL — ABNORMAL LOW (ref 4.22–5.81)
RDW: 12.3 % (ref 11.5–15.5)
WBC: 4.9 10*3/uL (ref 4.0–10.5)
nRBC: 0 % (ref 0.0–0.2)

## 2020-08-12 LAB — GLUCOSE, CAPILLARY
Glucose-Capillary: 264 mg/dL — ABNORMAL HIGH (ref 70–99)
Glucose-Capillary: 105 mg/dL — ABNORMAL HIGH (ref 70–99)
Glucose-Capillary: 74 mg/dL (ref 70–99)
Glucose-Capillary: 87 mg/dL (ref 70–99)

## 2020-08-12 MED ORDER — SODIUM CHLORIDE 0.9 % IV SOLN
2.0000 g | Freq: Three times a day (TID) | INTRAVENOUS | Status: DC
  Administered 2020-08-13 – 2020-08-15 (×7): 2 g via INTRAVENOUS
  Filled 2020-08-12 (×11): qty 2

## 2020-08-12 MED ORDER — GLIPIZIDE 5 MG PO TABS
5.0000 mg | ORAL_TABLET | Freq: Every day | ORAL | Status: DC
  Administered 2020-08-12 – 2020-08-15 (×4): 5 mg via ORAL
  Filled 2020-08-12 (×4): qty 1

## 2020-08-12 NOTE — Evaluation (Signed)
Physical Therapy Evaluation Patient Details Name: Scott Howard MRN: [id number] DOB: 07-31-1960 Today's Date: 08/12/2020   History of Present Illness  Scott Howard is a 60yoM who comes to [facility] on 7/12 from outpatient podiatry due to concerns over Rt hallux osteomyelitis. PMH: DM2, PVD s/p amputation of Left toes 4 and 5, HTN. Workup here revealing of osteomyelitis at Rt digits 1 and 5. Pt went to OR 7/14 for excision of infected MTPs 1 and 5 on Rt foot. POD1 podiatry note grants clearance for RLE PWB on heel only for transfers and getting to the bathroom. Pt has a (+) COVID test from 7/12.  Clinical Impression  Pt admitted with above diagnosis. Pt currently with functional limitations due to the deficits listed below (see "PT Problem List"). Upon entry, pt in bed, awake and agreeable to participate. The pt is alert, pleasant, interactive, and able to provide info regarding prior level of function, both in tolerance and independence. Pt just went through a similar episode contralaterally 2 months ago, is already familiar with mobility strategy, precautions, DME, postop shoes, course of recovery. Pt performs all mobility in room with modified independence, essentially only requires assist for IV pole management. From his recent experience he assert that he can successfully perform heel weightbearing in either his regular Darco postop shoe or his ortho wedge shoe- today he uses the former, as the latter is at home. Patient's performance this date reveals decreased ability, independence, and tolerance in performing all basic mobility required for performance of activities of daily living. Pt requires additional DME, close physical assistance, and cues for safe participate in mobility. Pt will benefit from skilled PT intervention to increase independence and safety with basic mobility in preparation for discharge to the venue listed below.        Follow Up Recommendations Follow surgeon's recommendation for  DC plan and follow-up therapies    Equipment Recommendations  None recommended by PT    Recommendations for Other Services       Precautions / Restrictions Precautions Precautions: Fall Restrictions Weight Bearing Restrictions: Yes RLE Weight Bearing: Partial weight bearing RLE Partial Weight Bearing Percentage or Pounds: heel% weight bearing for BR privs      Mobility  Bed Mobility Overal bed mobility: Modified Independent                  Transfers Overall transfer level: Modified independent Equipment used: Rolling walker (2 wheeled)             General transfer comment: pt dons darco shoe x2 at EOB prior to stand  Ambulation/Gait   Gait Distance (Feet): 70 Feet Assistive device: Rolling walker (2 wheeled) Gait Pattern/deviations: Step-to pattern     General Gait Details: 3-point RLE-leading step-to pattern; pt is very successful at maintaining heel weightbearing, no balance or safety issues; Radio producer IV pole  Information systems manager Rankin (Stroke Patients Only)       Balance Overall balance assessment: Modified Independent                                           Pertinent Vitals/Pain Pain Assessment: No/denies pain    Home Living Family/patient expects to be discharged to:: Private residence Living Arrangements: Alone Available Help at Discharge: Family (2 sisters already helping) Type of  Home: House Home Access: Stairs to enter Entrance Stairs-Rails: Right Entrance Stairs-Number of Steps: 3 Home Layout: One level Home Equipment: Grab bars - tub/shower;Grab bars - toilet;Walker - 2 wheels Additional Comments: 2 DARCO shoes, 2 post ob wedge shoes    Prior Function Level of Independence: Independent         Comments: PTA was AMB household distances to occasional modI recently with postop shoes when out of house     Hand Dominance   Dominant Hand: Right     Extremity/Trunk Assessment   Upper Extremity Assessment Upper Extremity Assessment: Overall WFL for tasks assessed    Lower Extremity Assessment Lower Extremity Assessment: Overall WFL for tasks assessed       Communication      Cognition Arousal/Alertness: Awake/alert Behavior During Therapy: WFL for tasks assessed/performed Overall Cognitive Status: Within Functional Limits for tasks assessed                                        General Comments      Exercises     Assessment/Plan    PT Assessment Patient needs continued PT services  PT Problem List Decreased activity tolerance;Decreased balance       PT Treatment Interventions DME instruction;Gait training;Functional mobility training;Stair training;Therapeutic activities;Therapeutic exercise;Balance training;Neuromuscular re-education;Patient/family education    PT Goals (Current goals can be found in the Care Plan section)  Acute Rehab PT Goals Patient Stated Goal: return to home and heel this wound PT Goal Formulation: With patient Time For Goal Achievement: 08/12/20 Potential to Achieve Goals: Good    Frequency 7X/week   Barriers to discharge        Co-evaluation               AM-PAC PT "6 Clicks" Mobility  Outcome Measure Help needed turning from your back to your side while in a flat bed without using bedrails?: None Help needed moving from lying on your back to sitting on the side of a flat bed without using bedrails?: None Help needed moving to and from a bed to a chair (including a wheelchair)?: None Help needed standing up from a chair using your arms (e.g., wheelchair or bedside chair)?: None Help needed to walk in hospital room?: A Little Help needed climbing 3-5 steps with a railing? : A Little 6 Click Score: 22    End of Session   Activity Tolerance: Patient tolerated treatment well;No increased pain Patient left: in bed;with call bell/phone within reach Nurse  Communication: Mobility status PT Visit Diagnosis: Unsteadiness on feet (R26.81);Difficulty in walking, not elsewhere classified (R26.2);Other abnormalities of gait and mobility (R26.89)    Time: 2993-7169 PT Time Calculation (min) (ACUTE ONLY): 17 min   Charges:   PT Evaluation $PT Eval Moderate Complexity: 1 Mod         3:58 PM, 08/12/20 Rosamaria Lints, PT, DPT Physical Therapist - [facility]  [phone] (ASCOM)    Meigan Pates C 08/12/2020, 3:55 PM
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
## 2020-08-12 NOTE — Progress Notes (Signed)
1 Day Post-Op   Subjective/Chief Complaint: Patient seen.  Doing well with no complaints of pain.   Objective: Vital signs in last 24 hours: Temp:  [97.5 F (36.4 C)-98.6 F (37 C)] 98.2 F (36.8 C) (07/15 0810) Pulse Rate:  [70-96] 78 (07/15 0810) Resp:  [15-20] 18 (07/15 0810) BP: (101-176)/(58-95) 155/79 (07/15 0810) SpO2:  [95 %-100 %] 97 % (07/15 0810) Last BM Date: 08/10/20  Intake/Output from previous day: 07/14 0701 - 07/15 0700 In: 875 [P.O.:200; I.V.:675] Out: 400 [Urine:400] Intake/Output this shift: No intake/output data recorded.  Some mild strikethrough noted on the medial bandaging.  Upon removal there is moderate to heavy bleeding noted.  No signs of purulence seen.  Incisions both medial and lateral are well coapted with no significant erythema or sign of infection.     Lab Results:  Recent Labs    08/11/20 0705 08/12/20 0534  WBC 5.6 4.9  HGB 12.1* 11.8*  HCT 36.2* 34.4*  PLT 194 193   BMET Recent Labs    08/11/20 0705 08/12/20 0534  NA 137 133*  K 4.3 4.4  CL 105 104  CO2 25 24  GLUCOSE 159* 306*  BUN 18 16  CREATININE 1.06 0.97  CALCIUM 8.4* 8.3*   PT/INR No results for input(s): LABPROT, INR in the last 72 hours. ABG No results for input(s): PHART, HCO3 in the last 72 hours.  Invalid input(s): PCO2, PO2  Studies/Results: PERIPHERAL VASCULAR CATHETERIZATION  Result Date: 08/10/2020 See surgical note for result.   Anti-infectives: Anti-infectives (From admission, onward)    Start     Dose/Rate Route Frequency Ordered Stop   08/11/20 1750  vancomycin (VANCOCIN) powder  Status:  Discontinued          As needed 08/11/20 1751 08/11/20 1843   08/11/20 0000  ceFAZolin (ANCEF) IVPB 1 g/50 mL premix  Status:  Discontinued       Note to Pharmacy: To be given in specials   1 g 100 mL/hr over 30 Minutes Intravenous  Once 08/10/20 1704 08/10/20 1807   08/09/20 1000  cefTRIAXone (ROCEPHIN) 2 g in sodium chloride 0.9 % 100 mL IVPB   Status:  Discontinued       See Hyperspace for full Linked Orders Report.   2 g 200 mL/hr over 30 Minutes Intravenous Every 24 hours 08/09/20 0958 08/09/20 0958   08/09/20 1000  metroNIDAZOLE (FLAGYL) IVPB 500 mg  Status:  Discontinued       See Hyperspace for full Linked Orders Report.   500 mg 100 mL/hr over 60 Minutes Intravenous Every 8 hours 08/09/20 0958 08/09/20 0958   08/09/20 0915  cefTRIAXone (ROCEPHIN) 2 g in sodium chloride 0.9 % 100 mL IVPB       See Hyperspace for full Linked Orders Report.   2 g 200 mL/hr over 30 Minutes Intravenous Every 24 hours 08/09/20 0909     08/09/20 0915  metroNIDAZOLE (FLAGYL) IVPB 500 mg       See Hyperspace for full Linked Orders Report.   500 mg 100 mL/hr over 60 Minutes Intravenous Every 8 hours 08/09/20 0909         Assessment/Plan: s/p Procedure(s): Right 1st and 5th Ray Resection (Right) Assessment: Stable status post debridement osteomyelitis right first and fifth metatarsals.  Plan: Betadine applied to the incisions followed by bulky sterile bandaging.  Patient may be partial weightbearing on the right foot with pressure only on the heel for bathroom privileges and transfers.  Will need IV antibiotics  and PICC line placement and consult placed for infectious disease.  Most likely I will plan for dressing change on Sunday.  LOS: 3 days    Scott Howard 08/12/2020
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
## 2020-08-12 NOTE — TOC Progression Note (Signed)
Transition of Care [facility]) - Progression Note    Patient Details  Name: Scott Howard MRN: [id number] Date of Birth: [birth year]  [facility]) CM/SW Contact  Su Hilt, RN Phone Number: 08/12/2020, 4:36 PM  Clinical Narrative:     Met with the patient to discuss DC plan and needs He has had [facility] and [facility] for [facility], He plans to get a PICC tomorrow and will go home on IV ABX again, his sister will assist him at home, He has all the DME he needs I spoke with Pam at [facility] and she will set up to have the patient have [facility] will take care of the nursing piece.        Expected Discharge Plan and Services                                                 Social Determinants of Health (SDOH) Interventions    Readmission Risk Interventions No flowsheet data found.

## 2020-08-12 NOTE — Plan of Care (Signed)
No acute events overnight. Pt denies pain.  Problem: Education: Goal: Knowledge of General Education information will improve Description: Including pain rating scale, medication(s)/side effects and non-pharmacologic comfort measures Outcome: Progressing   Problem: Health Behavior/Discharge Planning: Goal: Ability to manage health-related needs will improve Outcome: Progressing   Problem: Clinical Measurements: Goal: Ability to maintain clinical measurements within normal limits will improve Outcome: Progressing Goal: Will remain free from infection Outcome: Progressing Goal: Diagnostic test results will improve Outcome: Progressing Goal: Respiratory complications will improve Outcome: Progressing Goal: Cardiovascular complication will be avoided Outcome: Progressing   Problem: Activity: Goal: Risk for activity intolerance will decrease Outcome: Progressing   Problem: Nutrition: Goal: Adequate nutrition will be maintained Outcome: Progressing   Problem: Coping: Goal: Level of anxiety will decrease Outcome: Progressing   Problem: Elimination: Goal: Will not experience complications related to bowel motility Outcome: Progressing Goal: Will not experience complications related to urinary retention Outcome: Progressing   Problem: Pain Managment: Goal: General experience of comfort will improve Outcome: Progressing   Problem: Safety: Goal: Ability to remain free from injury will improve Outcome: Progressing   Problem: Skin Integrity: Goal: Risk for impaired skin integrity will decrease Outcome: Progressing

## 2020-08-13 ENCOUNTER — Inpatient Hospital Stay (HOSPITAL_COMMUNITY)
Admit: 2020-08-13 | Discharge: 2020-08-13 | Disposition: A | Discharge status: Home / Self Care | Payer: 59 | Attending: Internal Medicine | Admitting: Internal Medicine

## 2020-08-13 DIAGNOSIS — M86171 Other acute osteomyelitis, right ankle and foot: Secondary | ICD-10-CM | POA: Diagnosis not present

## 2020-08-13 DIAGNOSIS — R011 Cardiac murmur, unspecified: Secondary | ICD-10-CM | POA: Diagnosis not present

## 2020-08-13 LAB — BASIC METABOLIC PANEL
Anion gap: 5 (ref 5–15)
BUN: 23 mg/dL — ABNORMAL HIGH (ref 6–20)
CO2: 26 mmol/L (ref 22–32)
Calcium: 8.5 mg/dL — ABNORMAL LOW (ref 8.9–10.3)
Chloride: 104 mmol/L (ref 98–111)
Creatinine, Ser: 0.79 mg/dL (ref 0.61–1.24)
GFR, Estimated: >60 mL/min (ref >60)
Glucose, Bld: 92 mg/dL (ref 70–99)
Potassium: 3.8 mmol/L (ref 3.5–5.1)
Sodium: 135 mmol/L (ref 135–145)

## 2020-08-13 LAB — CBC WITH DIFFERENTIAL/PLATELET
Abs Immature Granulocytes: 0.02 10*3/uL (ref 0.00–0.07)
Basophils Absolute: 0 10*3/uL (ref 0.0–0.1)
Basophils Relative: 0 %
Eosinophils Absolute: 0 10*3/uL (ref 0.0–0.5)
Eosinophils Relative: 1 %
HCT: 33.4 % — ABNORMAL LOW (ref 39.0–52.0)
Hemoglobin: 11.5 g/dL — ABNORMAL LOW (ref 13.0–17.0)
Immature Granulocytes: 0 %
Lymphocytes Relative: 38 %
Lymphs Abs: 2.9 10*3/uL (ref 0.7–4.0)
MCH: 29.7 pg (ref 26.0–34.0)
MCHC: 34.4 g/dL (ref 30.0–36.0)
MCV: 86.3 fL (ref 80.0–100.0)
Monocytes Absolute: 0.6 10*3/uL (ref 0.1–1.0)
Monocytes Relative: 8 %
Neutro Abs: 4.2 10*3/uL (ref 1.7–7.7)
Neutrophils Relative %: 53 %
Platelets: 180 10*3/uL (ref 150–400)
RBC: 3.87 MIL/uL — ABNORMAL LOW (ref 4.22–5.81)
RDW: 12.4 % (ref 11.5–15.5)
WBC: 7.8 10*3/uL (ref 4.0–10.5)
nRBC: 0 % (ref 0.0–0.2)

## 2020-08-13 LAB — GLUCOSE, CAPILLARY
Glucose-Capillary: 95 mg/dL (ref 70–99)
Glucose-Capillary: 149 mg/dL — ABNORMAL HIGH (ref 70–99)
Glucose-Capillary: 107 mg/dL — ABNORMAL HIGH (ref 70–99)
Glucose-Capillary: 97 mg/dL (ref 70–99)

## 2020-08-13 NOTE — Progress Notes (Signed)
Physical Therapy Discharge Patient Details Name: Scott Howard MRN: [id number] DOB: 1960/05/25 Today's Date: 08/13/2020 Time: 0833-0900 PT Time Calculation (min) (ACUTE ONLY): 27 min  Patient discharged from PT services secondary to goals met and no further PT needs identified.  Please see latest therapy progress note for current level of functioning and progress toward goals.    Progress and discharge plan discussed with patient. Recommend daily routine mobility with RN staff.       Willette Pa 08/13/2020, 9:15 AM
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
## 2020-08-13 NOTE — TOC Progression Note (Signed)
Transition of Care [facility]) - Progression Note    Patient Details  Name: Scott Howard MRN: [id number] Date of Birth: 05-18-1960  Transition of Care [facility]) CM/SW Contact  Bing Quarry, RN Phone Number: 08/13/2020, 1:45 PM  Clinical Narrative:    7/16 NMS for discharge per provider notes dated today. Still waiting on PICC line. Echocardiogram pending today to rule out vegetation. Gabriel Cirri RN CM         Expected Discharge Plan and Services                                                 Social Determinants of Health (SDOH) Interventions    Readmission Risk Interventions No flowsheet data found.

## 2020-08-13 NOTE — Progress Notes (Signed)
Physical Therapy Treatment Patient Details Name: Scott Howard MRN: [id number] DOB: 1960/04/29 Today's Date: 08/13/2020    History of Present Illness Scott Howard is a 47yoM who comes to [facility] on 7/12 from outpatient podiatry due to concerns over Rt hallux osteomyelitis. PMH: DM2, PVD s/p amputation of Left toes 4 and 5, HTN. Workup here revealing of osteomyelitis at Rt digits 1 and 5. Pt went to OR 7/14 for excision of infected MTPs 1 and 5 on Rt foot. POD1 podiatry note grants clearance for RLE PWB on heel only for transfers and getting to the bathroom. Pt has a (+) COVID test from 7/12.    PT Comments    Pt was sitting EOB upon arriving. He is A and O x 4 and extremely cooperative and motivated. Was able to perform all task without assistance or safety concern. Recommend routine RN mobility. Pt was able to safely perform all desired task without safety concern. Performed ascending descending stairs without assistance or cueing. Will DC from acute PT. No follow up PT necessary.    Follow Up Recommendations  No PT follow up     Equipment Recommendations  None recommended by PT       Precautions / Restrictions Precautions Precautions: Fall Restrictions Weight Bearing Restrictions: Yes RLE Weight Bearing: Partial weight bearing    Mobility  Bed Mobility Overal bed mobility: Modified Independent     Transfers Overall transfer level: Modified independent     Ambulation/Gait Ambulation/Gait assistance: Modified independent (Device/Increase time)     Stairs Stairs: Yes Stairs assistance: Modified independent (Device/Increase time) Stair Management: One rail Right Number of Stairs: 4 General stair comments: pt easily able to ascend/descend stairs without difficulty or safety concern         Cognition Arousal/Alertness: Awake/alert Behavior During Therapy: WFL for tasks assessed/performed Overall Cognitive Status: Within Functional Limits for tasks assessed         General Comments: Pt is A and O x 4 and is pleasant motivated. knows his restrictions and feels confident he can safely DC home.             Pertinent Vitals/Pain Pain Assessment: No/denies pain     PT Goals (current goals can now be found in the care plan section) Acute Rehab PT Goals Patient Stated Goal: home. Progress towards PT goals: Progressing toward goals    Frequency    7X/week      PT Plan Other (comment) (pt has met all PT goals. DC from PT)       AM-PAC PT "6 Clicks" Mobility   Outcome Measure  Help needed turning from your back to your side while in a flat bed without using bedrails?: None Help needed moving from lying on your back to sitting on the side of a flat bed without using bedrails?: None Help needed moving to and from a bed to a chair (including a wheelchair)?: None Help needed standing up from a chair using your arms (e.g., wheelchair or bedside chair)?: None Help needed to walk in hospital room?: A Little Help needed climbing 3-5 steps with a railing? : A Little 6 Click Score: 22    End of Session Equipment Utilized During Treatment: Other (comment) (BLE post op shoes) Activity Tolerance: Patient tolerated treatment well;No increased pain Patient left: in bed;with call bell/phone within reach Nurse Communication: Mobility status PT Visit Diagnosis: Unsteadiness on feet (R26.81);Difficulty in walking, not elsewhere classified (R26.2);Other abnormalities of gait and mobility (R26.89)     Time: 7893-8101  PT Time Calculation (min) (ACUTE ONLY): 27 min  Charges:  $Gait Training: 8-22 mins $Therapeutic Activity: 8-22 mins                     Julaine Fusi PTA 08/13/20, 9:21 AM

## 2020-08-13 NOTE — Progress Notes (Signed)
*  PRELIMINARY RESULTS* Echocardiogram 2D Echocardiogram has been performed.  Scott Howard 08/13/2020, 4:02 PM

## 2020-08-14 DIAGNOSIS — M86171 Other acute osteomyelitis, right ankle and foot: Secondary | ICD-10-CM | POA: Diagnosis not present

## 2020-08-14 LAB — CULTURE, BLOOD (ROUTINE X 2)
Culture: NO GROWTH
Culture: NO GROWTH
Special Requests: ADEQUATE
Special Requests: ADEQUATE

## 2020-08-14 LAB — ECHOCARDIOGRAM COMPLETE
AR max vel: 1.41 cm2
AV Area VTI: 1.42 cm2
AV Area mean vel: 1.48 cm2
AV Mean grad: 11 mmHg
AV Peak grad: 23 mmHg
Ao pk vel: 2.4 m/s
Area-P 1/2: 5.16 cm2
Height: 66 in
S' Lateral: 2.7 cm
Weight: 2400 oz

## 2020-08-14 LAB — GLUCOSE, CAPILLARY
Glucose-Capillary: 123 mg/dL — ABNORMAL HIGH (ref 70–99)
Glucose-Capillary: 134 mg/dL — ABNORMAL HIGH (ref 70–99)
Glucose-Capillary: 88 mg/dL (ref 70–99)
Glucose-Capillary: 108 mg/dL — ABNORMAL HIGH (ref 70–99)

## 2020-08-14 MED ORDER — CLOPIDOGREL BISULFATE 75 MG PO TABS
75.0000 mg | ORAL_TABLET | Freq: Every day | ORAL | Status: DC
  Administered 2020-08-14 – 2020-08-15 (×2): 75 mg via ORAL
  Filled 2020-08-14 (×2): qty 1

## 2020-08-14 MED ORDER — LISINOPRIL 20 MG PO TABS
20.0000 mg | ORAL_TABLET | Freq: Every day | ORAL | Status: DC
  Administered 2020-08-14 – 2020-08-15 (×2): 20 mg via ORAL
  Filled 2020-08-14 (×2): qty 1
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
## 2020-08-14 NOTE — Progress Notes (Addendum)
3 Days Post-Op   Subjective/Chief Complaint: Patient seen.  No complaints.  Physical therapy has had him up and walking.   Objective: Vital signs in last 24 hours: Temp:  [97.9 F (36.6 C)-98.1 F (36.7 C)] 98.1 F (36.7 C) (07/17 0739) Pulse Rate:  [70-74] 74 (07/17 0739) Resp:  [16] 16 (07/17 0739) BP: (153-162)/(76-88) 162/76 (07/17 0739) SpO2:  [94 %-98 %] 96 % (07/17 0739) Last BM Date: 08/11/20  Intake/Output from previous day: 07/16 0701 - 07/17 0700 In: 1028 [P.O.:240; I.V.:588; IV Piggyback:200] Out: 1750 [Urine:1750] Intake/Output this shift: No intake/output data recorded.  Bandage on the right foot is dry and intact.  Upon removal there is moderate bleeding from the medial side of the foot.  Minimal on the lateral.  No drainage or signs of purulence.  Some early cyanotic discoloration along the distal aspect of the incision at the great toe concerning for some early incisional necrosis.     Lab Results:  Recent Labs    08/12/20 0534 08/13/20 0445  WBC 4.9 7.8  HGB 11.8* 11.5*  HCT 34.4* 33.4*  PLT 193 180   BMET Recent Labs    08/12/20 0534 08/13/20 0445  NA 133* 135  K 4.4 3.8  CL 104 104  CO2 24 26  GLUCOSE 306* 92  BUN 16 23*  CREATININE 0.97 0.79  CALCIUM 8.3* 8.5*   PT/INR No results for input(s): LABPROT, INR in the last 72 hours. ABG No results for input(s): PHART, HCO3 in the last 72 hours.  Invalid input(s): PCO2, PO2  Studies/Results: No results found.  Anti-infectives: Anti-infectives (From admission, onward)    Start     Dose/Rate Route Frequency Ordered Stop   08/13/20 0600  ceFEPIme (MAXIPIME) 2 g in sodium chloride 0.9 % 100 mL IVPB        2 g 200 mL/hr over 30 Minutes Intravenous Every 8 hours 08/12/20 1830     08/11/20 1750  vancomycin (VANCOCIN) powder  Status:  Discontinued          As needed 08/11/20 1751 08/11/20 1843   08/11/20 0000  ceFAZolin (ANCEF) IVPB 1 g/50 mL premix  Status:  Discontinued       Note to  Pharmacy: To be given in specials   1 g 100 mL/hr over 30 Minutes Intravenous  Once 08/10/20 1704 08/10/20 1807   08/09/20 1000  cefTRIAXone (ROCEPHIN) 2 g in sodium chloride 0.9 % 100 mL IVPB  Status:  Discontinued       See Hyperspace for full Linked Orders Report.   2 g 200 mL/hr over 30 Minutes Intravenous Every 24 hours 08/09/20 0958 08/09/20 0958   08/09/20 1000  metroNIDAZOLE (FLAGYL) IVPB 500 mg  Status:  Discontinued       See Hyperspace for full Linked Orders Report.   500 mg 100 mL/hr over 60 Minutes Intravenous Every 8 hours 08/09/20 0958 08/09/20 0958   08/09/20 0915  cefTRIAXone (ROCEPHIN) 2 g in sodium chloride 0.9 % 100 mL IVPB  Status:  Discontinued       See Hyperspace for full Linked Orders Report.   2 g 200 mL/hr over 30 Minutes Intravenous Every 24 hours 08/09/20 0909 08/12/20 1830   08/09/20 0915  metroNIDAZOLE (FLAGYL) IVPB 500 mg       See Hyperspace for full Linked Orders Report.   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  Plan: Betadine applied to the incision areas followed by bulky gauze bandage.  Discussed with the patient that we will have to watch the area along his great toe incision.  Discussed that if this progresses amputation of the toe is not out of the question.  Awaiting pathology and culture results.  From podiatry standpoint patient can be discharged when ready with follow-up in 1 week.  LOS: 5 days    [name] 08/14/2020

## 2020-08-14 NOTE — TOC Progression Note (Signed)
Transition of Care [facility]) - Progression Note    Patient Details  Name: Scott Howard MRN: [id number] Date of Birth: [birth year]  Transition of [facility]) CM/SW Contact  Bing Quarry, RN Phone Number: 08/14/2020, 10:50 AM  Clinical Narrative:  08/14/20. PICC line is pending return of blood cultures per ID provider. Gabriel Cirri RN CM           Expected Discharge Plan and Services                                                 Social Determinants of Health (SDOH) Interventions    Readmission Risk Interventions No flowsheet data found.

## 2020-08-15 ENCOUNTER — Other Ambulatory Visit (HOSPITAL_COMMUNITY): Payer: Self-pay

## 2020-08-15 DIAGNOSIS — E11628 Type 2 diabetes mellitus with other skin complications: Secondary | ICD-10-CM | POA: Diagnosis not present

## 2020-08-15 DIAGNOSIS — E1142 Type 2 diabetes mellitus with diabetic polyneuropathy: Secondary | ICD-10-CM

## 2020-08-15 DIAGNOSIS — M86171 Other acute osteomyelitis, right ankle and foot: Secondary | ICD-10-CM | POA: Diagnosis not present

## 2020-08-15 DIAGNOSIS — L089 Local infection of the skin and subcutaneous tissue, unspecified: Secondary | ICD-10-CM | POA: Diagnosis not present

## 2020-08-15 LAB — AEROBIC CULTURE W GRAM STAIN (SUPERFICIAL SPECIMEN)
Culture: NORMAL
Gram Stain: NONE SEEN

## 2020-08-15 LAB — GLUCOSE, CAPILLARY
Glucose-Capillary: 113 mg/dL — ABNORMAL HIGH (ref 70–99)
Glucose-Capillary: 127 mg/dL — ABNORMAL HIGH (ref 70–99)

## 2020-08-15 MED ORDER — LINEZOLID 600 MG PO TABS: 600.0000 mg | ORAL_TABLET | Freq: Two times a day (BID) | ORAL | 0 refills | Status: AC

## 2020-08-15 MED ORDER — RISAQUAD PO CAPS
2.0000 | ORAL_CAPSULE | Freq: Three times a day (TID) | ORAL | Status: DC
  Administered 2020-08-15: 2 via ORAL
  Filled 2020-08-15: qty 2

## 2020-08-15 MED ORDER — RISAQUAD PO CAPS: 2.0000 | ORAL_CAPSULE | Freq: Three times a day (TID) | ORAL | 0 refills | Status: AC

## 2020-08-15 MED ORDER — CIPROFLOXACIN HCL 500 MG PO TABS: 500.0000 mg | ORAL_TABLET | Freq: Two times a day (BID) | ORAL | 0 refills | Status: AC

## 2020-08-15 MED ORDER — LISINOPRIL 10 MG PO TABS: 20.0000 mg | ORAL_TABLET | Freq: Every day | ORAL | 0 refills | Status: AC

## 2020-08-15 MED ORDER — COLLAGENASE 250 UNIT/GM EX OINT: TOPICAL_OINTMENT | Freq: Every day | CUTANEOUS | 0 refills | Status: AC

## 2020-08-15 NOTE — Discharge Summary (Signed)
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 S HALLHAJI [id number] AT 1109 AM BY CM Performed at [facility], [address], [place], [place] [address]    Report Status PENDING  Incomplete   Organism ID, Bacteria STAPHYLOCOCCUS CAPRAE  Final      Susceptibility   Staphylococcus caprae - MIC*    CIPROFLOXACIN <=0.5 SENSITIVE Sensitive     ERYTHROMYCIN <=0.25 SENSITIVE Sensitive     GENTAMICIN <=0.5 SENSITIVE Sensitive     OXACILLIN <=0.25 SENSITIVE Sensitive     TETRACYCLINE <=1 SENSITIVE Sensitive     VANCOMYCIN 1 SENSITIVE Sensitive     TRIMETH/SULFA <=10 SENSITIVE Sensitive     CLINDAMYCIN <=0.25 SENSITIVE Sensitive     RIFAMPIN <=0.5 SENSITIVE Sensitive     Inducible  Clindamycin NEGATIVE Sensitive     * RARE STAPHYLOCOCCUS CAPRAE  Aerobic/Anaerobic Culture w Gram Stain (surgical/deep wound)  Status: None (Preliminary result)   Collection Time: 08/11/20  6:20 PM   Specimen: PATH Other; Tissue  Result Value Ref Range Status   Specimen Description   Final    BONE RIGHT 1ST MATATARSAL BONE CULTURE Performed at [facility], [address] [place], [place] [address]    Special Requests   Final    NONE Performed at [facility], [address] [place], [place] [address]    Gram Stain NO WBC SEEN NO ORGANISMS SEEN   Final   Culture   Final    NO GROWTH 4 DAYS Performed at [facility], [address], [place], [place] [address]    Report Status PENDING  Incomplete  Aerobic Culture w Gram Stain (superficial specimen)     Status: None   Collection Time: 08/12/20  7:17 PM   Specimen: Foot; Wound  Result Value Ref Range Status   Specimen Description   Final    FOOT LEFT Performed at [facility], [address] [place], [place] [address]    Special Requests   Final    NONE Performed at [facility], [address] [place], [place] [address]    Gram Stain NO WBC SEEN NO ORGANISMS SEEN   Final   Culture   Final    RARE NORMAL SKIN FLORA NO GROUP A STREP (S.PYOGENES) ISOLATED NO STAPHYLOCOCCUS AUREUS ISOLATED Performed at [facility], [address], [place], [place] [address]    Report Status 08/15/2020 FINAL  Final     Signed: Melina Schools Ceaira Ernster  [facility] 08/15/2020, 1:23 PM

## 2020-08-15 NOTE — Progress Notes (Signed)
Discharge Note: Reviewed discharge instructions. Pt verbalized understanding. Pt discharged with personal belongings. Staff wheeled pt out. Pt transported to home via family vehicle.

## 2020-08-15 NOTE — Progress Notes (Signed)
Date of Admission:  08/09/2020     ID: Scott Howard is a 60 y.o. male Principal Problem:   Osteomyelitis of ankle or foot, acute, right (HCC) Active Problems:   Diabetic polyneuropathy associated with type 2 diabetes mellitus (HCC)   Essential hypertension   Diabetic foot ulcer (HCC)   Hyperkalemia    Subjective: Pt doing better No fever or chills Had frequent stools yesterday due to Juven ( splenda)   Medications:   acidophilus  2 capsule Oral TID   vitamin C  250 mg Oral BID   aspirin EC  81 mg Oral Daily   clopidogrel  75 mg Oral Daily   collagenase   Topical Daily   enoxaparin (LOVENOX) injection  40 mg Subcutaneous Q24H   glipiZIDE  5 mg Oral QAC breakfast   insulin aspart  0-15 Units Subcutaneous TID WC   lisinopril  20 mg Oral Daily   magnesium oxide  400 mg Oral Daily   nutrition supplement (JUVEN)  1 packet Oral BID BM   pravastatin  20 mg Oral QHS    Objective: Vital signs in last 24 hours: Temp:  [97.9 F (36.6 C)-98.5 F (36.9 C)] 98.1 F (36.7 C) (07/18 0729) Pulse Rate:  [78-82] 78 (07/18 0729) Resp:  [16-18] 16 (07/18 0729) BP: (136-143)/(75-83) 136/83 (07/18 0729) SpO2:  [98 %] 98 % (07/18 0729)  PHYSICAL EXAM:  General: Alert, cooperative, no distress, appears stated age.  Head: Normocephalic, without obvious abnormality, atraumatic. Eyes: Conjunctivae clear, anicteric sclerae. Pupils are equal ENT Nares normal. No drainage or sinus tenderness. Lips, mucosa, and tongue normal. No Thrush Neck: Supple, symmetrical, no adenopathy, thyroid: non tender no carotid bruit and no JVD. Back: No CVA tenderness. Lungs: Clear to auscultation bilaterally. No Wheezing or Rhonchi. No rales. Heart: Regular rate and rhythm, systolic murmur Abdomen: Soft, non-tender,not distended. Bowel sounds normal. No masses Extremities: pictures reviewed     Small ulcer left great toe area Skin: No rashes or lesions. Or bruising Lymph: Cervical, supraclavicular  normal. Neurologic: Grossly non-focal  Lab Results Recent Labs    08/13/20 0445  WBC 7.8  HGB 11.5*  HCT 33.4*  NA 135  K 3.8  CL 104  CO2 26  BUN 23*  CREATININE 0.79    Microbiology: 08/11/20- 5th met bone culture- staphylococcus caprae    Assessment/Plan: Diabetic foot infection - right with underlying neuropathy and vascular disease- Started as Therma burns when he walked bare foot in [place] in Feb. Now has ulcers lateral aspect of the 5 th toe and medial to the great toe- he underwent debridement and resection of infected bone of great toe and 5th toe. 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 Took 2 weeks of IV dapto+ Po levaquin followed by 2 weeks of PO doxy  Dm on glipizide  Peripheral neuropathy Systolic murmur- calcification and thickening of aortic valve which is mild    Discussed the manage ment with the patient, his sister, pharmacist, Podiatrist and hospitlaist. Also communicated with Dr.Fitzgerald. He will see Dr.F in 2 weeks Also gave him my office number in case  he has any questions

## 2020-08-16 ENCOUNTER — Telehealth (INDEPENDENT_AMBULATORY_CARE_PROVIDER_SITE_OTHER): Payer: Self-pay | Admitting: Vascular Surgery

## 2020-08-16 ENCOUNTER — Telehealth: Payer: Self-pay | Admitting: Infectious Diseases

## 2020-08-16 LAB — SURGICAL PATHOLOGY

## 2020-08-16 NOTE — Telephone Encounter (Signed)
Patient was just discharged from hospital and f/u is requesting a 1 month follow up with abi.  Patient already has a f/u appointment on 8/18 for LE arterial with Dr. Gilda Crease.  Patient is wanting to know if both ultrasounds is needed? Please advise and I will call patient back with information.

## 2020-08-16 NOTE — Telephone Encounter (Signed)
Yes he does.

## 2020-08-17 LAB — AEROBIC/ANAEROBIC CULTURE W GRAM STAIN (SURGICAL/DEEP WOUND)
Culture: NO GROWTH
Gram Stain: NONE SEEN
Gram Stain: NONE SEEN

## 2020-08-17 NOTE — Telephone Encounter (Signed)
Relayed message to patient

## 2020-09-12 ENCOUNTER — Encounter: Payer: Self-pay | Admitting: Ophthalmology

## 2020-09-13 ENCOUNTER — Other Ambulatory Visit (INDEPENDENT_AMBULATORY_CARE_PROVIDER_SITE_OTHER): Payer: Self-pay | Admitting: Vascular Surgery

## 2020-09-13 DIAGNOSIS — E119 Type 2 diabetes mellitus without complications: Secondary | ICD-10-CM | POA: Insufficient documentation

## 2020-09-13 DIAGNOSIS — I70209 Unspecified atherosclerosis of native arteries of extremities, unspecified extremity: Secondary | ICD-10-CM

## 2020-09-13 DIAGNOSIS — I70299 Other atherosclerosis of native arteries of extremities, unspecified extremity: Secondary | ICD-10-CM

## 2020-09-13 DIAGNOSIS — L97909 Non-pressure chronic ulcer of unspecified part of unspecified lower leg with unspecified severity: Secondary | ICD-10-CM

## 2020-09-13 NOTE — Progress Notes (Signed)
MRN : [id number]  Scott Howard is a 60 y.o. (01/20/1961) male who presents with chief complaint of leg blockages.  History of Present Illness:   The patient returns to the office for followup and review of the noninvasive studies. There have been no interval changes in lower extremity symptoms. No interval shortening of the patient's claudication distance or development of rest pain symptoms. No new ulcers or wounds have occurred since the last visit.  There have been no significant changes to the patient's overall health care.  The patient denies amaurosis fugax or recent TIA symptoms. There are no recent neurological changes noted. The patient denies history of DVT, PE or superficial thrombophlebitis. The patient denies recent episodes of angina or shortness of breath.   ABI Rt=1.12 and Lt=[name] triphasic  (no change from previous)  Duplex ultrasound of the widely patent with triphasic signals   No outpatient medications have been marked as taking for the 09/15/20 encounter (Appointment) with Gilda Crease, Latina Craver, MD.    Past Medical History:  Diagnosis Date   Diabetes mellitus without complication (HCC)    Heart murmur    Mild   Hyperlipidemia    borderline   Hypertension     Past Surgical History:  Procedure Laterality Date   AMPUTATION Right 08/11/2020   Procedure: Right 1st and 5th Ray Resection;  Surgeon: Linus Galas, DPM;  Location: [facility];  Service: Podiatry;  Laterality: Right;   AMPUTATION TOE Left 05/29/2020   Procedure: 4th and 5th TOE AMPUTATION WITH PARTIAL RAY RESECTION;  Surgeon: Linus Galas, DPM;  Location: [facility];  Service: Podiatry;  Laterality: Left;   HAND SURGERY Left    LOWER EXTREMITY ANGIOGRAPHY Left 05/27/2020   Procedure: Lower Extremity Angiography;  Surgeon: Renford Dills, MD;  Location: [facility];  Service: Cardiovascular;  Laterality: Left;   LOWER EXTREMITY ANGIOGRAPHY Right 05/31/2020   Procedure: Lower Extremity Angiography;   Surgeon: Renford Dills, MD;  Location: [facility];  Service: Cardiovascular;  Laterality: Right;   LOWER EXTREMITY ANGIOGRAPHY Right 08/10/2020   Procedure: Lower Extremity Angiography;  Surgeon: Renford Dills, MD;  Location: [facility];  Service: Cardiovascular;  Laterality: Right;   TONSILLECTOMY AND ADENOIDECTOMY      Social History Social History   Tobacco Use   Smoking status: Never   Smokeless tobacco: Never  Vaping Use   Vaping Use: Never used  Substance Use Topics   Alcohol use: Yes    Comment: ocassionally   Drug use: Never    Family History Family History  Problem Relation Age of Onset   Cancer Mother    Cancer Father    Hypertension Brother    Congestive Heart Failure Brother     Allergies  Allergen Reactions   Silvadene [Silver Sulfadiazine] Rash   Sulfa Antibiotics Rash   Linezolid Rash     REVIEW OF SYSTEMS (Negative unless checked)  Constitutional: [] Weight loss  [] Fever  [] Chills Cardiac: [] Chest pain   [] Chest pressure   [] Palpitations   [] Shortness of breath when laying flat   [] Shortness of breath with exertion. Vascular:  [x] Pain in legs with walking   [] Pain in legs at rest  [] History of DVT   [] Phlebitis   [] Swelling in legs   [] Varicose veins   [] Non-healing ulcers Pulmonary:   [] Uses home oxygen   [] Productive cough   [] Hemoptysis   [] Wheeze  [] COPD   [] Asthma Neurologic:  [] Dizziness   [] Seizures   [] History  of stroke   [] History of TIA  [] Aphasia   [] Vissual changes   [] Weakness or numbness in arm   [] Weakness or numbness in leg Musculoskeletal:   [] Joint swelling   [] Joint pain   [] Low back pain Hematologic:  [] Easy bruising  [] Easy bleeding   [] Hypercoagulable state   [] Anemic Gastrointestinal:  [] Diarrhea   [] Vomiting  [] Gastroesophageal reflux/heartburn   [] Difficulty swallowing. Genitourinary:  [] Chronic kidney disease   [] Difficult urination  [] Frequent urination   [] Blood in urine Skin:  [] Rashes   [] Ulcers   Psychological:  [] History of anxiety   []  History of major depression.  Physical Examination  There were no vitals filed for this visit. There is no height or weight on file to calculate BMI. Gen: WD/WN, NAD Head: [place]/AT, No temporalis wasting.  Ear/Nose/Throat: Hearing grossly intact, nares w/o erythema or drainage Eyes: PER, EOMI, sclera nonicteric.  Neck: Supple, no masses.  No bruit or JVD.  Pulmonary:  Good air movement, no audible wheezing, no use of accessory muscles.  Cardiac: RRR, normal S1, S2, no Murmurs. Vascular:   Vessel Right Left  Radial Palpable Palpable  PT Trace Palpable Trace Palpable  DP Trace Palpable Trace Palpable  Gastrointestinal: soft, non-distended. No guarding/no peritoneal signs.  Musculoskeletal: M/S 5/5 throughout.  No visible deformity.  Neurologic: CN 2-12 intact. Pain and light touch intact in extremities.  Symmetrical.  Speech is fluent. Motor exam as listed above. Psychiatric: Judgment intact, Mood & affect appropriate for pt's clinical situation. Dermatologic: No rashes or ulcers noted.  No changes consistent with cellulitis.   CBC Lab Results  Component Value Date   WBC 7.8 08/13/2020   HGB 11.5 (L) 08/13/2020   HCT 33.4 (L) 08/13/2020   MCV 86.3 08/13/2020   PLT 180 08/13/2020    BMET    Component Value Date/Time   NA 135 08/13/2020 0445   K 3.8 08/13/2020 0445   CL 104 08/13/2020 0445   CO2 26 08/13/2020 0445   GLUCOSE 92 08/13/2020 0445   BUN 23 (H) 08/13/2020 0445   CREATININE 0.79 08/13/2020 0445   CALCIUM 8.5 (L) 08/13/2020 0445   GFRNONAA >60 08/13/2020 0445   CrCl cannot be calculated (Patient's most recent lab result is older than the maximum 21 days allowed.).  COAG Lab Results  Component Value Date   INR 1.0 05/27/2020    Radiology No results found.   Assessment/Plan 1. Atherosclerosis of native arteries of the extremities with ulceration (HCC)  Recommend:  The patient has evidence of atherosclerosis of  the lower extremities with claudication.  The patient does not voice lifestyle limiting changes at this point in time.  Noninvasive studies do not suggest clinically significant change.  No invasive studies, angiography or surgery at this time The patient should continue walking and begin a more formal exercise program.  The patient should continue antiplatelet therapy and aggressive treatment of the lipid abnormalities  No changes in the patient's medications at this time  - VAS LOWER EXTREMITY ARTERIAL DUPLEX; Future - VAS ABI WITH/WO TBI; Future  2. Essential hypertension Continue antihypertensive medications as already ordered, these medications have been reviewed and there are no changes at this time.   3. Type 2 diabetes mellitus with diabetic peripheral angiopathy without gangrene, unspecified whether long term insulin use (HCC) Continue hypoglycemic medications as already ordered, these medications have been reviewed and there are no changes at this time.  Hgb A1C to be monitored as already arranged by primary service  4. Mixed hyperlipidemia Continue statin as ordered and reviewed, no changes at this time     [name], MD  09/13/2020 10:03 AM

## 2020-09-15 ENCOUNTER — Other Ambulatory Visit: Payer: Self-pay

## 2020-09-15 ENCOUNTER — Encounter (INDEPENDENT_AMBULATORY_CARE_PROVIDER_SITE_OTHER): Payer: Self-pay | Admitting: Vascular Surgery

## 2020-09-15 ENCOUNTER — Ambulatory Visit (INDEPENDENT_AMBULATORY_CARE_PROVIDER_SITE_OTHER): Payer: 59 | Admitting: Vascular Surgery

## 2020-09-15 ENCOUNTER — Ambulatory Visit (INDEPENDENT_AMBULATORY_CARE_PROVIDER_SITE_OTHER): Payer: 59

## 2020-09-15 VITALS — BP 108/69 | HR 83 | Resp 16 | Wt 160.0 lb

## 2020-09-15 DIAGNOSIS — E1151 Type 2 diabetes mellitus with diabetic peripheral angiopathy without gangrene: Secondary | ICD-10-CM | POA: Diagnosis not present

## 2020-09-15 DIAGNOSIS — I70299 Other atherosclerosis of native arteries of extremities, unspecified extremity: Secondary | ICD-10-CM | POA: Diagnosis not present

## 2020-09-15 DIAGNOSIS — E782 Mixed hyperlipidemia: Secondary | ICD-10-CM

## 2020-09-15 DIAGNOSIS — I70209 Unspecified atherosclerosis of native arteries of extremities, unspecified extremity: Secondary | ICD-10-CM | POA: Diagnosis not present

## 2020-09-15 DIAGNOSIS — L97909 Non-pressure chronic ulcer of unspecified part of unspecified lower leg with unspecified severity: Secondary | ICD-10-CM | POA: Diagnosis not present

## 2020-09-15 DIAGNOSIS — I7025 Atherosclerosis of native arteries of other extremities with ulceration: Secondary | ICD-10-CM | POA: Diagnosis not present

## 2020-09-15 DIAGNOSIS — I10 Essential (primary) hypertension: Secondary | ICD-10-CM | POA: Diagnosis not present

## 2020-09-15 NOTE — Discharge Instructions (Signed)

## 2020-09-18 ENCOUNTER — Encounter (INDEPENDENT_AMBULATORY_CARE_PROVIDER_SITE_OTHER): Payer: Self-pay | Admitting: Vascular Surgery

## 2020-09-19 ENCOUNTER — Encounter: Payer: Self-pay | Admitting: Ophthalmology

## 2020-09-19 ENCOUNTER — Encounter
Admission: RE | Disposition: A | Discharge status: Home / Self Care | Payer: Self-pay | Source: Home / Self Care | Attending: Ophthalmology

## 2020-09-19 ENCOUNTER — Ambulatory Visit: Payer: 59 | Admitting: Anesthesiology

## 2020-09-19 ENCOUNTER — Ambulatory Visit
Admission: RE | Admit: 2020-09-19 | Discharge: 2020-09-19 | Disposition: A | Discharge status: Home / Self Care | Payer: 59 | Attending: Ophthalmology | Admitting: Ophthalmology

## 2020-09-19 ENCOUNTER — Other Ambulatory Visit: Payer: Self-pay

## 2020-09-19 DIAGNOSIS — Z882 Allergy status to sulfonamides status: Secondary | ICD-10-CM | POA: Insufficient documentation

## 2020-09-19 DIAGNOSIS — Z89431 Acquired absence of right foot: Secondary | ICD-10-CM | POA: Insufficient documentation

## 2020-09-19 DIAGNOSIS — Z79899 Other long term (current) drug therapy: Secondary | ICD-10-CM | POA: Insufficient documentation

## 2020-09-19 DIAGNOSIS — Z888 Allergy status to other drugs, medicaments and biological substances status: Secondary | ICD-10-CM | POA: Insufficient documentation

## 2020-09-19 DIAGNOSIS — Z89432 Acquired absence of left foot: Secondary | ICD-10-CM | POA: Diagnosis not present

## 2020-09-19 DIAGNOSIS — Z7984 Long term (current) use of oral hypoglycemic drugs: Secondary | ICD-10-CM | POA: Diagnosis not present

## 2020-09-19 DIAGNOSIS — H2511 Age-related nuclear cataract, right eye: Secondary | ICD-10-CM | POA: Insufficient documentation

## 2020-09-19 DIAGNOSIS — E1151 Type 2 diabetes mellitus with diabetic peripheral angiopathy without gangrene: Secondary | ICD-10-CM | POA: Diagnosis not present

## 2020-09-19 DIAGNOSIS — E113211 Type 2 diabetes mellitus with mild nonproliferative diabetic retinopathy with macular edema, right eye: Secondary | ICD-10-CM | POA: Diagnosis not present

## 2020-09-19 DIAGNOSIS — E1136 Type 2 diabetes mellitus with diabetic cataract: Secondary | ICD-10-CM | POA: Insufficient documentation

## 2020-09-19 DIAGNOSIS — Z7982 Long term (current) use of aspirin: Secondary | ICD-10-CM | POA: Insufficient documentation

## 2020-09-19 DIAGNOSIS — Z7902 Long term (current) use of antithrombotics/antiplatelets: Secondary | ICD-10-CM | POA: Insufficient documentation

## 2020-09-19 HISTORY — PX: CATARACT EXTRACTION W/PHACO: SHX586

## 2020-09-19 HISTORY — DX: Cardiac murmur, unspecified: R01.1

## 2020-09-19 LAB — GLUCOSE, CAPILLARY
Glucose-Capillary: 160 mg/dL — ABNORMAL HIGH (ref 70–99)
Glucose-Capillary: 147 mg/dL — ABNORMAL HIGH (ref 70–99)

## 2020-09-19 SURGERY — PHACOEMULSIFICATION, CATARACT, WITH IOL INSERTION
Anesthesia: Monitor Anesthesia Care | Site: Eye | Laterality: Right

## 2020-09-19 MED ORDER — TRIAMCINOLONE ACETONIDE 40 MG/ML IJ SUSP
INTRAMUSCULAR | Status: DC | PRN
  Administered 2020-09-19: .1 mL

## 2020-09-19 MED ORDER — FENTANYL CITRATE (PF) 100 MCG/2ML IJ SOLN
INTRAMUSCULAR | Status: DC | PRN
  Administered 2020-09-19: 50 ug via INTRAVENOUS

## 2020-09-19 MED ORDER — SIGHTPATH DOSE#1 SODIUM HYALURONATE 23 MG/ML IO SOLUTION
PREFILLED_SYRINGE | INTRAOCULAR | Status: DC | PRN
  Administered 2020-09-19: 0.55 mL via INTRAOCULAR

## 2020-09-19 MED ORDER — SIGHTPATH DOSE#1 SODIUM HYALURONATE 10 MG/ML IO SOLUTION
PREFILLED_SYRINGE | INTRAOCULAR | Status: DC | PRN
  Administered 2020-09-19: 0.85 mL via INTRAOCULAR

## 2020-09-19 MED ORDER — SIGHTPATH DOSE#1 BSS IO SOLN
INTRAOCULAR | Status: DC | PRN
  Administered 2020-09-19: 75 mL via OPHTHALMIC

## 2020-09-19 MED ORDER — MIDAZOLAM HCL 2 MG/2ML IJ SOLN
INTRAMUSCULAR | Status: DC | PRN
  Administered 2020-09-19: 2 mg via INTRAVENOUS

## 2020-09-19 MED ORDER — SIGHTPATH DOSE#1 BSS IO SOLN
INTRAOCULAR | Status: DC | PRN
  Administered 2020-09-19: 15 mL

## 2020-09-19 MED ORDER — PHENYLEPHRINE HCL 10 % OP SOLN
1.0000 [drp] | OPHTHALMIC | Status: DC | PRN
Start: 2020-09-19 — End: 2020-09-19
  Administered 2020-09-19 (×3): 1 [drp] via OPHTHALMIC

## 2020-09-19 MED ORDER — CYCLOPENTOLATE HCL 2 % OP SOLN
1.0000 [drp] | OPHTHALMIC | Status: DC | PRN
  Administered 2020-09-19 (×3): 1 [drp] via OPHTHALMIC

## 2020-09-19 MED ORDER — LIDOCAINE HCL (PF) 2 % IJ SOLN
INTRAOCULAR | Status: DC | PRN
  Administered 2020-09-19: 1 mL via INTRAOCULAR

## 2020-09-19 MED ORDER — TETRACAINE HCL 0.5 % OP SOLN
1.0000 [drp] | OPHTHALMIC | Status: DC | PRN
  Administered 2020-09-19 (×3): 1 [drp] via OPHTHALMIC

## 2020-09-19 MED ORDER — MOXIFLOXACIN HCL 0.5 % OP SOLN
OPHTHALMIC | Status: DC | PRN
  Administered 2020-09-19: 0.2 mL via OPHTHALMIC

## 2020-09-19 SURGICAL SUPPLY — 15 items
CANNULA ANT/CHMB 27GA (MISCELLANEOUS) ×4 IMPLANT
DISSECTOR HYDRO NUCLEUS 50X22 (MISCELLANEOUS) ×2 IMPLANT
GLOVE SURG ENC TEXT LTX SZ7.5 (GLOVE) ×2 IMPLANT
GLOVE SURG GAMMEX PI TX LF 7.5 (GLOVE) ×2 IMPLANT
GLOVE SURG SYN 8.5  E (GLOVE) ×1
GLOVE SURG SYN 8.5 E (GLOVE) ×1 IMPLANT
GOWN STRL REUS W/ TWL LRG LVL3 (GOWN DISPOSABLE) ×2 IMPLANT
GOWN STRL REUS W/TWL LRG LVL3 (GOWN DISPOSABLE) ×4
LENS IOL TECNIS EYHANCE 23.5 (Intraocular Lens) ×2 IMPLANT
MARKER SKIN DUAL TIP RULER LAB (MISCELLANEOUS) ×2 IMPLANT
PACK EYE AFTER SURG (MISCELLANEOUS) ×2 IMPLANT
SYR 3ML LL SCALE MARK (SYRINGE) ×2 IMPLANT
SYR TB 1ML LUER SLIP (SYRINGE) ×2 IMPLANT
WATER STERILE IRR 250ML POUR (IV SOLUTION) ×2 IMPLANT
WIPE NON LINTING 3.25X3.25 (MISCELLANEOUS) ×2 IMPLANT

## 2020-09-19 NOTE — H&P (Signed)
[facility]   Primary Care Physician:  Mick Sell, MD Ophthalmologist: Dr. Willey Blade  Pre-Procedure History & Physical: HPI:  Scott Howard is a 60 y.o. male here for cataract surgery.   Past Medical History:  Diagnosis Date   Diabetes mellitus without complication (HCC)    Heart murmur    Mild   Hyperlipidemia    borderline   Hypertension     Past Surgical History:  Procedure Laterality Date   AMPUTATION Right 08/11/2020   Procedure: Right 1st and 5th Ray Resection;  Surgeon: Linus Galas, DPM;  Location: [facility];  Service: Podiatry;  Laterality: Right;   AMPUTATION TOE Left 05/29/2020   Procedure: 4th and 5th TOE AMPUTATION WITH PARTIAL RAY RESECTION;  Surgeon: Linus Galas, DPM;  Location: [facility];  Service: Podiatry;  Laterality: Left;   HAND SURGERY Left    LOWER EXTREMITY ANGIOGRAPHY Left 05/27/2020   Procedure: Lower Extremity Angiography;  Surgeon: Renford Dills, MD;  Location: [facility];  Service: Cardiovascular;  Laterality: Left;   LOWER EXTREMITY ANGIOGRAPHY Right 05/31/2020   Procedure: Lower Extremity Angiography;  Surgeon: Renford Dills, MD;  Location: [facility];  Service: Cardiovascular;  Laterality: Right;   LOWER EXTREMITY ANGIOGRAPHY Right 08/10/2020   Procedure: Lower Extremity Angiography;  Surgeon: Renford Dills, MD;  Location: [facility];  Service: Cardiovascular;  Laterality: Right;   TONSILLECTOMY AND ADENOIDECTOMY      Prior to Admission medications   Medication Sig Start Date End Date Taking? Authorizing Provider  aspirin EC 81 MG EC tablet Take 1 tablet (81 mg total) by mouth daily. Swallow whole. 06/02/20  Yes Arnetha Courser, MD  clopidogrel (PLAVIX) 75 MG tablet Take 1 tablet (75 mg total) by mouth daily. 06/01/20  Yes Stegmayer, Cala Bradford A, PA-C  glipiZIDE (GLUCOTROL) 5 MG tablet Take 5 mg by mouth 2 (two) times daily. 02/23/20  Yes [provider]  lisinopril (ZESTRIL) 10 MG tablet  Take 2 tablets (20 mg total) by mouth daily. 08/16/20 09/19/20 Yes Dahal, Melina Schools, MD  neomycin-bacitracin-polymyxin (NEOSPORIN) ointment Apply 1 application topically 3 (three) times a week. Apply to foot with dressing changes   Yes [provider]  pravastatin (PRAVACHOL) 20 MG tablet Take 20 mg by mouth at bedtime. 03/23/20  Yes [provider]  magnesium oxide (MAG-OX) 400 (240 Mg) MG tablet Take 1 tablet (400 mg total) by mouth daily. Patient not taking: No sig reported 06/02/20   Arnetha Courser, MD    Allergies as of 08/18/2020 - Review Complete 08/11/2020  Allergen Reaction Noted   Silvadene [silver sulfadiazine] Rash 05/27/2020   Sulfa antibiotics Rash 04/04/2020    Family History  Problem Relation Age of Onset   Cancer Mother    Cancer Father    Hypertension Brother    Congestive Heart Failure Brother     Social History   Socioeconomic History   Marital status: Divorced    Spouse name: Not on file   Number of children: Not on file   Years of education: Not on file   Highest education level: Not on file  Occupational History   Not on file  Tobacco Use   Smoking status: Never   Smokeless tobacco: Never  Vaping Use   Vaping Use: Never used  Substance and Sexual Activity   Alcohol use: Yes    Comment: ocassionally   Drug use: Never   Sexual activity: Not on file  Other Topics Concern  Not on file  Social History Narrative   Not on file   Social Determinants of Health   Financial Resource Strain: Not on file  Food Insecurity: Not on file  Transportation Needs: Not on file  Physical Activity: Not on file  Stress: Not on file  Social Connections: Not on file  Intimate Partner Violence: Not on file    Review of Systems: See HPI, otherwise negative ROS  Physical Exam: BP 120/82   Pulse 80   Temp 97.8 F (36.6 C) (Temporal)   Resp 18   Ht 5\' 6"  (1.676 m)   Wt 73.9 kg   SpO2 100%   BMI 26.31 kg/m  General:   Alert, cooperative in  NAD Head:  Normocephalic and atraumatic. Respiratory:  Normal work of breathing. Cardiovascular:  RRR  Impression/Plan: is here for cataract surgery.  Risks, benefits, limitations, and alternatives regarding cataract surgery have been reviewed with the patient.  Questions have been answered.  All parties agreeable.   Ashley Mariner, MD  09/19/2020, 7:56 AM

## 2020-09-19 NOTE — Op Note (Signed)
OPERATIVE NOTE  JAHKAI YANDELL [id number] 09/19/2020   PREOPERATIVE DIAGNOSIS:    Nuclear sclerotic cataract right eye.  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   PROCEDURE:    Phacoemusification with posterior chamber intraocular lens placement of the right eye  CPT [phone] Intravitreal injection of kenalog CPT [phone]  LENS:   Implant Name Type Inv. Item Serial No. Manufacturer Lot No. LRB No. Used Action  LENS IOL TECNIS EYHANCE 23.5 - [id number] Intraocular Lens LENS IOL TECNIS EYHANCE 23.5 [id number] JOHNSON   Right 1 Implanted       Procedure(s) with comments: CATARACT EXTRACTION PHACO AND INTRAOCULAR LENS PLACEMENT (IOC) RIGHT INTRVITREAL TRIESCENCE DIABETIC (Right) - Diabetic - oral meds 4.55 00:36.1  DIB00 +23.5   ULTRASOUND TIME: 0 minutes 36 seconds.  CDE 4.55   SURGEON:  Willey Blade, MD, MPH  ANESTHESIOLOGIST: Anesthesiologist: Jarome Matin, MD CRNA: Michaele Offer, CRNA   ANESTHESIA:  Topical with tetracaine drops augmented with 1% preservative-free intracameral lidocaine.  ESTIMATED BLOOD LOSS: less than 1 mL.   COMPLICATIONS:  None.   DESCRIPTION OF PROCEDURE:  The patient was identified in the holding room and transported to the operating room and placed in the supine position under the operating microscope.  The right eye was identified as the operative eye and it was prepped and draped in the usual sterile ophthalmic fashion.   A 1.0 millimeter clear-corneal paracentesis was made at the 10:30 position. 0.5 ml of preservative-free 1% lidocaine with epinephrine was injected into the anterior chamber.  The anterior chamber was filled with Healon 5 viscoelastic.  A 2.4 millimeter keratome was used to make a near-clear corneal incision at the 8:00 position.  A curvilinear capsulorrhexis was made with a cystotome and capsulorrhexis forceps.  Balanced salt solution was used to  hydrodissect and hydrodelineate the nucleus.   Phacoemulsification was then used in stop and chop fashion to remove the lens nucleus and epinucleus.  The remaining cortex was then removed using the irrigation and aspiration handpiece. Healon was then placed into the capsular bag to distend it for lens placement.  A lens was then injected into the capsular bag.     0.1 mL of Kenalog 40 mg/mL was injected into the vitreous cavity with a 27 gauge needle on a 1 cc syringe.  The remaining viscoelastic was aspirated.   Wounds were hydrated with balanced salt solution.  The anterior chamber was inflated to a physiologic pressure with balanced salt solution.   Intracameral vigamox 0.1 mL undiluted was injected into the eye and a drop placed onto the ocular surface.  No wound leaks were noted.  The patient was taken to the recovery room in stable condition without complications of anesthesia or surgery  Willey Blade 09/19/2020, 8:31 AM

## 2020-09-19 NOTE — Transfer of Care (Signed)
Immediate Anesthesia Transfer of Care Note  Patient: Scott Howard  Procedure(s) Performed: CATARACT EXTRACTION PHACO AND INTRAOCULAR LENS PLACEMENT (IOC) RIGHT INTRVITREAL TRIESCENCE DIABETIC (Right: Eye)  Patient Location: PACU  Anesthesia Type: MAC  Level of Consciousness: awake, alert  and patient cooperative  Airway and Oxygen Therapy: Patient Spontanous Breathing and Patient connected to supplemental oxygen  Post-op Assessment: Post-op Vital signs reviewed, Patient's Cardiovascular Status Stable, Respiratory Function Stable, Patent Airway and No signs of Nausea or vomiting  Post-op Vital Signs: Reviewed and stable  Complications: No notable events documented.

## 2020-09-19 NOTE — Anesthesia Postprocedure Evaluation (Signed)
Anesthesia Post Note  Patient: Scott Howard  Procedure(s) Performed: CATARACT EXTRACTION PHACO AND INTRAOCULAR LENS PLACEMENT (IOC) RIGHT INTRVITREAL TRIESCENCE DIABETIC (Right: Eye)     Patient location during evaluation: PACU Anesthesia Type: MAC Level of consciousness: awake and alert Pain management: pain level controlled Vital Signs Assessment: post-procedure vital signs reviewed and stable Respiratory status: spontaneous breathing, nonlabored ventilation, respiratory function stable and patient connected to nasal cannula oxygen Cardiovascular status: stable and blood pressure returned to baseline Postop Assessment: no apparent nausea or vomiting Anesthetic complications: no   No notable events documented.  Wanda Plump Darrien Laakso

## 2020-09-19 NOTE — Anesthesia Preprocedure Evaluation (Signed)
Anesthesia Evaluation  Patient identified by MRN, date of birth, ID band Patient awake    Reviewed: NPO status   Airway Mallampati: II       Dental   Pulmonary neg pulmonary ROS,    Pulmonary exam normal        Cardiovascular hypertension, + Peripheral Vascular Disease  Normal cardiovascular exam+ Valvular Problems/Murmurs      Neuro/Psych  Neuromuscular disease    GI/Hepatic   Endo/Other  diabetes  Renal/GU Renal InsufficiencyRenal disease     Musculoskeletal   Abdominal   Peds  Hematology   Anesthesia Other Findings   Reproductive/Obstetrics                             Anesthesia Physical Anesthesia Plan  ASA: 3  Anesthesia Plan: MAC   Post-op Pain Management:    Induction:   PONV Risk Score and Plan:   Airway Management Planned: Nasal Cannula  Additional Equipment:   Intra-op Plan:   Post-operative Plan:   Informed Consent: I have reviewed the patients History and Physical, chart, labs and discussed the procedure including the risks, benefits and alternatives for the proposed anesthesia with the patient or authorized representative who has indicated his/her understanding and acceptance.       Plan Discussed with:   Anesthesia Plan Comments:         Anesthesia Quick Evaluation

## 2020-09-20 ENCOUNTER — Other Ambulatory Visit: Payer: Self-pay

## 2020-09-20 ENCOUNTER — Encounter: Payer: Self-pay | Admitting: Ophthalmology

## 2020-10-07 NOTE — Discharge Instructions (Signed)

## 2020-10-07 NOTE — Anesthesia Preprocedure Evaluation (Addendum)
Anesthesia Evaluation  Patient identified by MRN, date of birth, ID band Patient awake    Reviewed: Allergy & Precautions, NPO status , Patient's Chart, lab work & pertinent test results, reviewed documented beta blocker date and time   History of Anesthesia Complications Negative for: history of anesthetic complications  Airway Mallampati: II  TM Distance: >3 FB Neck ROM: Full    Dental   Pulmonary    breath sounds clear to auscultation       Cardiovascular hypertension, (-) angina+ Peripheral Vascular Disease  (-) DOE  Rhythm:Regular Rate:Normal   HLD   Neuro/Psych  Neuromuscular disease (Peripheral neuropathy)    GI/Hepatic neg GERD  ,  Endo/Other  diabetes, Type 2  Renal/GU      Musculoskeletal   Abdominal   Peds  Hematology   Anesthesia Other Findings   Reproductive/Obstetrics                            Anesthesia Physical Anesthesia Plan  ASA: 2  Anesthesia Plan: MAC   Post-op Pain Management:    Induction: Intravenous  PONV Risk Score and Plan: 1 and TIVA, Midazolam and Treatment may vary due to age or medical condition  Airway Management Planned: Nasal Cannula  Additional Equipment:   Intra-op Plan:   Post-operative Plan:   Informed Consent: I have reviewed the patients History and Physical, chart, labs and discussed the procedure including the risks, benefits and alternatives for the proposed anesthesia with the patient or authorized representative who has indicated his/her understanding and acceptance.       Plan Discussed with: CRNA and Anesthesiologist  Anesthesia Plan Comments:         Anesthesia Quick Evaluation

## 2020-10-10 ENCOUNTER — Ambulatory Visit: Payer: 59 | Admitting: Anesthesiology

## 2020-10-10 ENCOUNTER — Encounter
Admission: RE | Disposition: A | Discharge status: Home / Self Care | Payer: Self-pay | Source: Home / Self Care | Attending: Ophthalmology

## 2020-10-10 ENCOUNTER — Other Ambulatory Visit: Payer: Self-pay

## 2020-10-10 ENCOUNTER — Ambulatory Visit
Admission: RE | Admit: 2020-10-10 | Discharge: 2020-10-10 | Disposition: A | Discharge status: Home / Self Care | Payer: 59 | Attending: Ophthalmology | Admitting: Ophthalmology

## 2020-10-10 DIAGNOSIS — Z7982 Long term (current) use of aspirin: Secondary | ICD-10-CM | POA: Diagnosis not present

## 2020-10-10 DIAGNOSIS — Z888 Allergy status to other drugs, medicaments and biological substances status: Secondary | ICD-10-CM | POA: Insufficient documentation

## 2020-10-10 DIAGNOSIS — Z79899 Other long term (current) drug therapy: Secondary | ICD-10-CM | POA: Diagnosis not present

## 2020-10-10 DIAGNOSIS — Z7984 Long term (current) use of oral hypoglycemic drugs: Secondary | ICD-10-CM | POA: Diagnosis not present

## 2020-10-10 DIAGNOSIS — Z882 Allergy status to sulfonamides status: Secondary | ICD-10-CM | POA: Diagnosis not present

## 2020-10-10 DIAGNOSIS — Z7902 Long term (current) use of antithrombotics/antiplatelets: Secondary | ICD-10-CM | POA: Insufficient documentation

## 2020-10-10 DIAGNOSIS — E1136 Type 2 diabetes mellitus with diabetic cataract: Secondary | ICD-10-CM | POA: Insufficient documentation

## 2020-10-10 DIAGNOSIS — E11311 Type 2 diabetes mellitus with unspecified diabetic retinopathy with macular edema: Secondary | ICD-10-CM | POA: Diagnosis not present

## 2020-10-10 DIAGNOSIS — Z8249 Family history of ischemic heart disease and other diseases of the circulatory system: Secondary | ICD-10-CM | POA: Insufficient documentation

## 2020-10-10 DIAGNOSIS — Z809 Family history of malignant neoplasm, unspecified: Secondary | ICD-10-CM | POA: Diagnosis not present

## 2020-10-10 DIAGNOSIS — H2512 Age-related nuclear cataract, left eye: Secondary | ICD-10-CM | POA: Diagnosis not present

## 2020-10-10 HISTORY — PX: CATARACT EXTRACTION W/PHACO: SHX586

## 2020-10-10 LAB — GLUCOSE, CAPILLARY
Glucose-Capillary: 145 mg/dL — ABNORMAL HIGH (ref 70–99)
Glucose-Capillary: 147 mg/dL — ABNORMAL HIGH (ref 70–99)

## 2020-10-10 SURGERY — PHACOEMULSIFICATION, CATARACT, WITH IOL INSERTION
Anesthesia: Monitor Anesthesia Care | Site: Eye | Laterality: Left

## 2020-10-10 MED ORDER — PHENYLEPHRINE HCL 10 % OP SOLN
1.0000 [drp] | OPHTHALMIC | Status: AC
  Administered 2020-10-10 (×3): 1 [drp] via OPHTHALMIC

## 2020-10-10 MED ORDER — TRIAMCINOLONE ACETONIDE 40 MG/ML IJ SUSP
INTRAMUSCULAR | Status: DC | PRN
  Administered 2020-10-10: .1 mL

## 2020-10-10 MED ORDER — SIGHTPATH DOSE#1 SODIUM HYALURONATE 23 MG/ML IO SOLUTION
PREFILLED_SYRINGE | INTRAOCULAR | Status: DC | PRN
  Administered 2020-10-10: .6 mL via INTRAOCULAR

## 2020-10-10 MED ORDER — ONDANSETRON HCL 4 MG/2ML IJ SOLN
4.0000 mg | Freq: Once | INTRAMUSCULAR | Status: DC | PRN
Start: 2020-10-10 — End: 2020-10-10

## 2020-10-10 MED ORDER — SIGHTPATH DOSE#1 BSS IO SOLN
INTRAOCULAR | Status: DC | PRN
  Administered 2020-10-10: 94 mL via OPHTHALMIC

## 2020-10-10 MED ORDER — SIGHTPATH DOSE#1 BSS IO SOLN
INTRAOCULAR | Status: DC | PRN
  Administered 2020-10-10: 15 mL

## 2020-10-10 MED ORDER — FENTANYL CITRATE (PF) 100 MCG/2ML IJ SOLN
INTRAMUSCULAR | Status: DC | PRN
  Administered 2020-10-10 (×2): 50 ug via INTRAVENOUS

## 2020-10-10 MED ORDER — TETRACAINE HCL 0.5 % OP SOLN
1.0000 [drp] | OPHTHALMIC | Status: DC | PRN
  Administered 2020-10-10 (×2): 1 [drp] via OPHTHALMIC

## 2020-10-10 MED ORDER — LACTATED RINGERS IV SOLN: INTRAVENOUS | Status: DC

## 2020-10-10 MED ORDER — MIDAZOLAM HCL 2 MG/2ML IJ SOLN
INTRAMUSCULAR | Status: DC | PRN
Start: 2020-10-10 — End: 2020-10-10
  Administered 2020-10-10: 2 mg via INTRAVENOUS

## 2020-10-10 MED ORDER — SIGHTPATH DOSE#1 SODIUM HYALURONATE 10 MG/ML IO SOLUTION
PREFILLED_SYRINGE | INTRAOCULAR | Status: DC | PRN
  Administered 2020-10-10: 0.55 mL via INTRAOCULAR

## 2020-10-10 MED ORDER — ACETAMINOPHEN 10 MG/ML IV SOLN: 1000.0000 mg | Freq: Once | INTRAVENOUS | Status: DC | PRN

## 2020-10-10 MED ORDER — MOXIFLOXACIN HCL 0.5 % OP SOLN
OPHTHALMIC | Status: DC | PRN
  Administered 2020-10-10: 0.2 mL via OPHTHALMIC

## 2020-10-10 MED ORDER — CYCLOPENTOLATE HCL 2 % OP SOLN
1.0000 [drp] | OPHTHALMIC | Status: AC | PRN
  Administered 2020-10-10 (×3): 1 [drp] via OPHTHALMIC

## 2020-10-10 MED ORDER — LIDOCAINE HCL (PF) 2 % IJ SOLN
INTRAOCULAR | Status: DC | PRN
  Administered 2020-10-10: 1 mL via INTRAOCULAR

## 2020-10-10 SURGICAL SUPPLY — 14 items
CANNULA ANT/CHMB 27GA (MISCELLANEOUS) ×4 IMPLANT
DISSECTOR HYDRO NUCLEUS 50X22 (MISCELLANEOUS) ×2 IMPLANT
GLOVE SURG GAMMEX PI TX LF 7.5 (GLOVE) ×2 IMPLANT
GLOVE SURG SYN 8.5  E (GLOVE) ×1
GLOVE SURG SYN 8.5 E (GLOVE) ×1 IMPLANT
GOWN STRL REUS W/ TWL LRG LVL3 (GOWN DISPOSABLE) ×2 IMPLANT
GOWN STRL REUS W/TWL LRG LVL3 (GOWN DISPOSABLE) ×4
LENS IOL TECNIS EYHANCE 23.5 (Intraocular Lens) ×2 IMPLANT
MARKER SKIN DUAL TIP RULER LAB (MISCELLANEOUS) ×2 IMPLANT
PACK EYE AFTER SURG (MISCELLANEOUS) ×2 IMPLANT
SYR 3ML LL SCALE MARK (SYRINGE) ×2 IMPLANT
SYR TB 1ML LUER SLIP (SYRINGE) ×2 IMPLANT
WATER STERILE IRR 250ML POUR (IV SOLUTION) ×2 IMPLANT
WIPE NON LINTING 3.25X3.25 (MISCELLANEOUS) ×2 IMPLANT

## 2020-10-10 NOTE — Anesthesia Procedure Notes (Signed)
Procedure Name: MAC Date/Time: 10/10/2020 9:41 AM Performed by: Jeannene Patella, CRNA Pre-anesthesia Checklist: Patient identified, Emergency Drugs available, Suction available, Timeout performed and Patient being monitored Patient Re-evaluated:Patient Re-evaluated prior to induction Oxygen Delivery Method: Nasal cannula Placement Confirmation: positive ETCO2

## 2020-10-10 NOTE — Transfer of Care (Signed)
Immediate Anesthesia Transfer of Care Note  Patient: Scott Howard  Procedure(s) Performed: CATARACT EXTRACTION PHACO AND INTRAOCULAR LENS PLACEMENT (IOC) LEFT INTRAVITREAL TRIESCENCE DIABETIC 4.86 00:38.7 (Left: Eye)  Patient Location: PACU  Anesthesia Type: MAC  Level of Consciousness: awake, alert  and patient cooperative  Airway and Oxygen Therapy: Patient Spontanous Breathing and Patient connected to supplemental oxygen  Post-op Assessment: Post-op Vital signs reviewed, Patient's Cardiovascular Status Stable, Respiratory Function Stable, Patent Airway and No signs of Nausea or vomiting  Post-op Vital Signs: Reviewed and stable  Complications: No notable events documented.

## 2020-10-10 NOTE — H&P (Signed)
[facility]   Primary Care Physician:  Mick Sell, MD Ophthalmologist: Dr. Willey Blade  Pre-Procedure History & Physical: HPI:  Scott Howard is a 60 y.o. male here for cataract surgery.   Past Medical History:  Diagnosis Date   Diabetes mellitus without complication (HCC)    Heart murmur    Mild   Hyperlipidemia    borderline   Hypertension     Past Surgical History:  Procedure Laterality Date   AMPUTATION Right 08/11/2020   Procedure: Right 1st and 5th Ray Resection;  Surgeon: Linus Galas, DPM;  Location: [facility];  Service: Podiatry;  Laterality: Right;   AMPUTATION TOE Left 05/29/2020   Procedure: 4th and 5th TOE AMPUTATION WITH PARTIAL RAY RESECTION;  Surgeon: Linus Galas, DPM;  Location: [facility];  Service: Podiatry;  Laterality: Left;   CATARACT EXTRACTION W/PHACO Right 09/19/2020   Procedure: CATARACT EXTRACTION PHACO AND INTRAOCULAR LENS PLACEMENT (IOC) RIGHT INTRVITREAL TRIESCENCE DIABETIC;  Surgeon: Nevada Crane, MD;  Location: [facility];  Service: Ophthalmology;  Laterality: Right;  Diabetic - oral meds 4.55 00:36.1   HAND SURGERY Left    LOWER EXTREMITY ANGIOGRAPHY Left 05/27/2020   Procedure: Lower Extremity Angiography;  Surgeon: Renford Dills, MD;  Location: [facility];  Service: Cardiovascular;  Laterality: Left;   LOWER EXTREMITY ANGIOGRAPHY Right 05/31/2020   Procedure: Lower Extremity Angiography;  Surgeon: Renford Dills, MD;  Location: [facility];  Service: Cardiovascular;  Laterality: Right;   LOWER EXTREMITY ANGIOGRAPHY Right 08/10/2020   Procedure: Lower Extremity Angiography;  Surgeon: Renford Dills, MD;  Location: [facility];  Service: Cardiovascular;  Laterality: Right;   TONSILLECTOMY AND ADENOIDECTOMY      Prior to Admission medications   Medication Sig Start Date End Date Taking? Authorizing Provider  aspirin EC 81 MG EC tablet Take 1 tablet (81 mg total) by mouth daily.  Swallow whole. 06/02/20  Yes Arnetha Courser, MD  clopidogrel (PLAVIX) 75 MG tablet Take 1 tablet (75 mg total) by mouth daily. 06/01/20  Yes Stegmayer, Cala Bradford A, PA-C  glipiZIDE (GLUCOTROL) 5 MG tablet Take 5 mg by mouth 2 (two) times daily. 02/23/20  Yes [provider]  lisinopril (ZESTRIL) 10 MG tablet Take 2 tablets (20 mg total) by mouth daily. 08/16/20 10/10/20 Yes Dahal, Melina Schools, MD  magnesium oxide (MAG-OX) 400 (240 Mg) MG tablet Take 1 tablet (400 mg total) by mouth daily. 06/02/20  Yes Arnetha Courser, MD  neomycin-bacitracin-polymyxin (NEOSPORIN) ointment Apply 1 application topically 3 (three) times a week. Apply to foot with dressing changes   Yes [provider]  pravastatin (PRAVACHOL) 20 MG tablet Take 20 mg by mouth at bedtime. 03/23/20  Yes [provider]    Allergies as of 08/18/2020 - Review Complete 08/11/2020  Allergen Reaction Noted   Silvadene [silver sulfadiazine] Rash 05/27/2020   Sulfa antibiotics Rash 04/04/2020    Family History  Problem Relation Age of Onset   Cancer Mother    Cancer Father    Hypertension Brother    Congestive Heart Failure Brother     Social History   Socioeconomic History   Marital status: Divorced    Spouse name: Not on file   Number of children: Not on file   Years of education: Not on file   Highest education level: Not on file  Occupational History   Not on file  Tobacco Use   Smoking status: Never   Smokeless tobacco: Never  Vaping  Use   Vaping Use: Never used  Substance and Sexual Activity   Alcohol use: Yes    Comment: ocassionally   Drug use: Never   Sexual activity: Not on file  Other Topics Concern   Not on file  Social History Narrative   Not on file   Social Determinants of Health   Financial Resource Strain: Not on file  Food Insecurity: Not on file  Transportation Needs: Not on file  Physical Activity: Not on file  Stress: Not on file  Social Connections: Not on file  Intimate  Partner Violence: Not on file    Review of Systems: See HPI, otherwise negative ROS  Physical Exam: BP (!) 160/82   Pulse 71   Temp (!) 97.2 F (36.2 C) (Temporal)   Resp 16   Wt 75.3 kg   SpO2 97%   BMI 26.79 kg/m  General:   Alert, cooperative in NAD Head:  Normocephalic and atraumatic. Respiratory:  Normal work of breathing. Cardiovascular:  RRR  Impression/Plan: Scott Howard is here for cataract surgery.  Risks, benefits, limitations, and alternatives regarding cataract surgery have been reviewed with the patient.  Questions have been answered.  All parties agreeable.   Willey Blade, MD  10/10/2020, 9:28 AM

## 2020-10-10 NOTE — Op Note (Signed)
OPERATIVE NOTE  Scott Howard [id number] 10/10/2020   PREOPERATIVE DIAGNOSIS:   1.  Nuclear sclerotic cataract left eye.  H25.12 2.  Diabetic retinopathy with macular edema N27.7824   POSTOPERATIVE DIAGNOSIS:    same.   PROCEDURE:    Phacoemusification with posterior chamber intraocular lens placement of the left eye  Intravitreal injection of kenalog CPT [id number]  LENS:   Implant Name Type Inv. Item Serial No. Manufacturer Lot No. LRB No. Used Action  LENS IOL TECNIS EYHANCE 23.5 - [id number] Intraocular Lens LENS IOL TECNIS EYHANCE 23.5 [id number] JOHNSON   Left 1 Implanted      Procedure(s) with comments: CATARACT EXTRACTION PHACO AND INTRAOCULAR LENS PLACEMENT (IOC) LEFT INTRAVITREAL TRIESCENCE DIABETIC 4.86 00:38.7 (Left) - Diabetic - oral meds    SURGEON:  Willey Blade, MD, MPH   ANESTHESIA:  Topical with tetracaine drops augmented with 1% preservative-free intracameral lidocaine.  ESTIMATED BLOOD LOSS: <1 mL   COMPLICATIONS:  None.   DESCRIPTION OF PROCEDURE:  The patient was identified in the holding room and transported to the operating room and placed in the supine position under the operating microscope.  The left eye was identified as the operative eye and it was prepped and draped in the usual sterile ophthalmic fashion.   A 1.0 millimeter clear-corneal paracentesis was made at the 5:00 position. 0.5 ml of preservative-free 1% lidocaine with epinephrine was injected into the anterior chamber.  The anterior chamber was filled with Healon 5 viscoelastic.  A 2.4 millimeter keratome was used to make a near-clear corneal incision at the 2:00 position.  A curvilinear capsulorrhexis was made with a cystotome and capsulorrhexis forceps.  Balanced salt solution was used to hydrodissect and hydrodelineate the nucleus.   Phacoemulsification was then used in stop and chop fashion to remove the lens nucleus and epinucleus.  The remaining cortex was then removed using the irrigation  and aspiration handpiece. Healon was then placed into the capsular bag to distend it for lens placement.  A lens was then injected into the capsular bag.  The remaining viscoelastic was aspirated.   Wounds were hydrated with balanced salt solution.  The anterior chamber was inflated to a physiologic pressure with balanced salt solution.  Intracameral vigamox 0.1 mL undiltued was injected into the eye and a drop placed onto the ocular surface.  Calipers were used to mark 3.5 mm posterior to the limbus in the superotemporal quadrant.  0.1 mL of Kenalog 40 mg/mL was injected into the vitreous cavity.   No wound leaks were noted.  The patient was taken to the recovery room in stable condition without complications of anesthesia or surgery  Willey Blade 10/10/2020, 9:59 AM

## 2020-10-10 NOTE — Anesthesia Postprocedure Evaluation (Signed)
Anesthesia Post Note  Patient: Scott Howard  Procedure(s) Performed: CATARACT EXTRACTION PHACO AND INTRAOCULAR LENS PLACEMENT (IOC) LEFT INTRAVITREAL TRIESCENCE DIABETIC 4.86 00:38.7 (Left: Eye)     Patient location during evaluation: PACU Anesthesia Type: MAC Level of consciousness: awake and alert Pain management: pain level controlled Vital Signs Assessment: post-procedure vital signs reviewed and stable Respiratory status: spontaneous breathing, nonlabored ventilation, respiratory function stable and patient connected to nasal cannula oxygen Cardiovascular status: stable and blood pressure returned to baseline Postop Assessment: no apparent nausea or vomiting Anesthetic complications: no   No notable events documented.  Lenoard Helbert A  Prosper Paff

## 2020-10-11 ENCOUNTER — Encounter: Payer: Self-pay | Admitting: Ophthalmology

## 2021-01-11 ENCOUNTER — Other Ambulatory Visit (INDEPENDENT_AMBULATORY_CARE_PROVIDER_SITE_OTHER): Payer: Self-pay | Admitting: Nurse Practitioner

## 2021-01-12 ENCOUNTER — Ambulatory Visit (INDEPENDENT_AMBULATORY_CARE_PROVIDER_SITE_OTHER): Payer: 59

## 2021-01-12 ENCOUNTER — Ambulatory Visit (INDEPENDENT_AMBULATORY_CARE_PROVIDER_SITE_OTHER): Payer: 59 | Admitting: Nurse Practitioner

## 2021-01-12 ENCOUNTER — Encounter (INDEPENDENT_AMBULATORY_CARE_PROVIDER_SITE_OTHER): Payer: Self-pay | Admitting: Nurse Practitioner

## 2021-01-12 ENCOUNTER — Other Ambulatory Visit: Payer: Self-pay

## 2021-01-12 VITALS — BP 124/74 | HR 77 | Resp 16 | Wt 165.0 lb

## 2021-01-12 DIAGNOSIS — I7025 Atherosclerosis of native arteries of other extremities with ulceration: Secondary | ICD-10-CM | POA: Diagnosis not present

## 2021-01-12 DIAGNOSIS — E782 Mixed hyperlipidemia: Secondary | ICD-10-CM | POA: Diagnosis not present

## 2021-01-12 DIAGNOSIS — E1151 Type 2 diabetes mellitus with diabetic peripheral angiopathy without gangrene: Secondary | ICD-10-CM | POA: Diagnosis not present

## 2021-01-12 DIAGNOSIS — I10 Essential (primary) hypertension: Secondary | ICD-10-CM

## 2021-01-22 ENCOUNTER — Encounter (INDEPENDENT_AMBULATORY_CARE_PROVIDER_SITE_OTHER): Payer: Self-pay | Admitting: Nurse Practitioner

## 2021-01-22 NOTE — Progress Notes (Signed)
Subjective:    Patient ID: Scott Howard, male    DOB: 12-Jun-1960, 60 y.o.   MRN: [id number] Chief Complaint  Patient presents with   Follow-up    Ultrasound follow up    Paramedics is a 60 year old male that is a current patient that was recently referred back due to concern for slow healing ulceration.  The patient has previously underwent angiograms due to his peripheral arterial disease.  Today the patient has a right ABI 1.21 and the left of 1.43.  There is a TBI 0.34 on the right and 1.04 on the left.  He has biphasic tibial artery waveforms with slightly dampened toe waveforms.  The right lower extremity arterial duplex notes mainly biphasic and triphasic waveforms throughout the right lower extremity.  There is an occluded posterior tibial artery however there are significant collaterals to the.  The left lower extremity has triphasic/biphasic waveforms with good toe waveforms.   Review of Systems  Skin:  Positive for wound.  All other systems reviewed and are negative.     Objective:   Physical Exam Vitals reviewed.  HENT:     Head: Normocephalic.  Cardiovascular:     Rate and Rhythm: Normal rate.     Pulses: Normal pulses.  Pulmonary:     Effort: Pulmonary effort is normal.  Skin:    General: Skin is warm and dry.     Comments: Wound on right second toe  Neurological:     Mental Status: He is alert and oriented to person, place, and time.  Psychiatric:        Mood and Affect: Mood normal.        Behavior: Behavior normal.        Thought Content: Thought content normal.        Judgment: Judgment normal.    BP 124/74 (BP Location: Right Arm)    Pulse 77    Resp 16    Wt 165 lb (74.8 kg)    BMI 26.63 kg/m   Past Medical History:  Diagnosis Date   Diabetes mellitus without complication (HCC)    Heart murmur    Mild   Hyperlipidemia    borderline   Hypertension     Social History   Socioeconomic History   Marital status: Divorced    Spouse name: Not on  file   Number of children: Not on file   Years of education: Not on file   Highest education level: Not on file  Occupational History   Not on file  Tobacco Use   Smoking status: Never   Smokeless tobacco: Never  Vaping Use   Vaping Use: Never used  Substance and Sexual Activity   Alcohol use: Yes    Comment: ocassionally   Drug use: Never   Sexual activity: Not on file  Other Topics Concern   Not on file  Social History Narrative   Not on file   Social Determinants of Health   Financial Resource Strain: Not on file  Food Insecurity: Not on file  Transportation Needs: Not on file  Physical Activity: Not on file  Stress: Not on file  Social Connections: Not on file  Intimate Partner Violence: Not on file    Past Surgical History:  Procedure Laterality Date   AMPUTATION Right 08/11/2020   Procedure: Right 1st and 5th Ray Resection;  Surgeon: [name], DPM;  Location: [facility];  Service: Podiatry;  Laterality: Right;   AMPUTATION TOE Left 05/29/2020   Procedure:  4th and 5th TOE AMPUTATION WITH PARTIAL RAY RESECTION;  Surgeon: Linus Galas, DPM;  Location: [facility];  Service: Podiatry;  Laterality: Left;   CATARACT EXTRACTION W/PHACO Right 09/19/2020   Procedure: CATARACT EXTRACTION PHACO AND INTRAOCULAR LENS PLACEMENT (IOC) RIGHT INTRVITREAL TRIESCENCE DIABETIC;  Surgeon: Nevada Crane, MD;  Location: [facility];  Service: Ophthalmology;  Laterality: Right;  Diabetic - oral meds 4.55 00:36.1   CATARACT EXTRACTION W/PHACO Left 10/10/2020   Procedure: CATARACT EXTRACTION PHACO AND INTRAOCULAR LENS PLACEMENT (IOC) LEFT INTRAVITREAL TRIESCENCE DIABETIC 4.86 00:38.7;  Surgeon: Nevada Crane, MD;  Location: [facility];  Service: Ophthalmology;  Laterality: Left;  Diabetic - oral meds   HAND SURGERY Left    LOWER EXTREMITY ANGIOGRAPHY Left 05/27/2020   Procedure: Lower Extremity Angiography;  Surgeon: Renford Dills, MD;  Location: [facility];   Service: Cardiovascular;  Laterality: Left;   LOWER EXTREMITY ANGIOGRAPHY Right 05/31/2020   Procedure: Lower Extremity Angiography;  Surgeon: Renford Dills, MD;  Location: [facility];  Service: Cardiovascular;  Laterality: Right;   LOWER EXTREMITY ANGIOGRAPHY Right 08/10/2020   Procedure: Lower Extremity Angiography;  Surgeon: Renford Dills, MD;  Location: [facility];  Service: Cardiovascular;  Laterality: Right;   TONSILLECTOMY AND ADENOIDECTOMY      Family History  Problem Relation Age of Onset   Cancer Mother    Cancer Father    Hypertension Brother    Congestive Heart Failure Brother     Allergies  Allergen Reactions   Silvadene [Silver Sulfadiazine] Rash   Sulfa Antibiotics Rash   Linezolid Rash    CBC Latest Ref Rng & Units 08/13/2020 08/12/2020 08/11/2020  WBC 4.0 - 10.5 K/uL 7.8 4.9 5.6  Hemoglobin 13.0 - 17.0 g/dL 11.5(L) 11.8(L) 12.1(L)  Hematocrit 39.0 - 52.0 % 33.4(L) 34.4(L) 36.2(L)  Platelets 150 - 400 K/uL 180 193 194      CMP     Component Value Date/Time   NA 135 08/13/2020 0445   K 3.8 08/13/2020 0445   CL 104 08/13/2020 0445   CO2 26 08/13/2020 0445   GLUCOSE 92 08/13/2020 0445   BUN 23 (H) 08/13/2020 0445   CREATININE 0.79 08/13/2020 0445   CALCIUM 8.5 (L) 08/13/2020 0445   PROT 8.3 (H) 08/09/2020 0910   ALBUMIN 3.9 08/09/2020 0910   AST 14 (L) 08/09/2020 0910   ALT 16 08/09/2020 0910   ALKPHOS 84 08/09/2020 0910   BILITOT 1.1 08/09/2020 0910   GFRNONAA >60 08/13/2020 0445     VAS [place] ABI WITH/WO TBI  Result Date: 01/12/2021  LOWER EXTREMITY DOPPLER STUDY Patient Name:  Scott Howard  Date of Exam:   01/12/2021 Medical Rec #: [id number]       Accession #:    [id number] Date of Birth: 13-Sep-1960       Patient Gender: M Patient Age:   22 years Exam Location:  [facility] Procedure:      VAS [place] ABI WITH/WO TBI Referring Phys: --------------------------------------------------------------------------------   Indications: Peripheral artery disease, and slow healing wound on rt 2nd toe.  Performing Technologist: Salvadore Farber RVT  Examination Guidelines: A complete evaluation includes at minimum, Doppler waveform signals and systolic blood pressure reading at the level of bilateral brachial, anterior tibial, and posterior tibial arteries, when vessel segments are accessible. Bilateral testing is considered an integral part of a complete examination. Photoelectric Plethysmograph (PPG) waveforms and toe systolic pressure readings are included as required and additional  duplex testing as needed. Limited examinations for reoccurring indications may be performed as noted.  ABI Findings: +---------+------------------+-----+--------+--------+  Right     Rt Pressure (mmHg) Index Waveform Comment   +---------+------------------+-----+--------+--------+  Brachial  124                                         +---------+------------------+-----+--------+--------+  ATA                                biphasic NonComp   +---------+------------------+-----+--------+--------+  PTA       150                1.21  biphasic           +---------+------------------+-----+--------+--------+  Great Toe 42                 0.34  Abnormal           +---------+------------------+-----+--------+--------+ +---------+------------------+-----+--------+-------+  Left      Lt Pressure (mmHg) Index Waveform Comment  +---------+------------------+-----+--------+-------+  ATA       177                                        +---------+------------------+-----+--------+-------+  PTA                                         NonComp  +---------+------------------+-----+--------+-------+  Great Toe 129                1.04  Normal            +---------+------------------+-----+--------+-------+ +-------+-----------+-----------+------------+------------+  ABI/TBI Today's ABI Today's TBI Previous ABI Previous TBI   +-------+-----------+-----------+------------+------------+  Right   1.21        .34         1.22         1.17          +-------+-----------+-----------+------------+------------+  Left    1.43        1.04        NonComp      1.10          +-------+-----------+-----------+------------+------------+ TOES Findings: +----------+---------------+--------+-------+  Right Toes Pressure (mmHg) Waveform Comment  +----------+---------------+--------+-------+  2nd Digit                  Abnormal          +----------+---------------+--------+-------+  +---------+---------------+--------+-------+  Left Toes Pressure (mmHg) Waveform Comment  +---------+---------------+--------+-------+  2nd Digit                 Normal            +---------+---------------+--------+-------+   Right TBIs appear decreased.  Summary: Right: Resting right ankle-brachial index is within normal range. No evidence of significant right lower extremity arterial disease. The right toe-brachial index is abnormal. 2nd toe has wound but has been healing lately. PPG show flow in 2nd toe. Left: Resting left ankle-brachial index is within normal range. No evidence of significant left lower extremity arterial disease. The left toe-brachial index is normal.  *See table(s) above for measurements and observations.  Electronically signed by Hortencia Pilar MD on 01/12/2021 at  5:24:12 PM.    Final        Assessment & Plan:   1. Atherosclerosis of native arteries of the extremities with ulceration ([place]) Based on the patient's noninvasive studies as well as the healthy bleeding with his recent debridement the patient's wound healing show that he should have marginal ability for wound healing.  We will have him continue to follow with podiatry for wound treatment however if his wound begins to slow heal or regress the patient may need to undergo an angiogram.  This was discussed as a plan with the patient.  We will have him return with noninvasive studies in 6  months or sooner if issues arise.  2. Essential hypertension Continue antihypertensive medications as already ordered, these medications have been reviewed and there are no changes at this time.   3. Type 2 diabetes mellitus with diabetic peripheral angiopathy without gangrene, unspecified whether long term insulin use ([place]) Continue hypoglycemic medications as already ordered, these medications have been reviewed and there are no changes at this time.  Hgb A1C to be monitored as already arranged by primary service   4. Mixed hyperlipidemia Continue statin as ordered and reviewed, no changes at this time    Current Outpatient Medications on File Prior to Visit  Medication Sig Dispense Refill   aspirin EC 81 MG EC tablet Take 1 tablet (81 mg total) by mouth daily. Swallow whole. 30 tablet 11   clopidogrel (PLAVIX) 75 MG tablet Take 1 tablet (75 mg total) by mouth daily. 90 tablet 3   glipiZIDE (GLUCOTROL) 5 MG tablet Take 5 mg by mouth 2 (two) times daily.     lisinopril (ZESTRIL) 10 MG tablet Take 2 tablets (20 mg total) by mouth daily. 60 tablet 0   pravastatin (PRAVACHOL) 20 MG tablet Take 20 mg by mouth at bedtime.     magnesium oxide (MAG-OX) 400 (240 Mg) MG tablet Take 1 tablet (400 mg total) by mouth daily. (Patient not taking: Reported on 01/12/2021) 30 tablet 0   neomycin-bacitracin-polymyxin (NEOSPORIN) ointment Apply 1 application topically 3 (three) times a week. Apply to foot with dressing changes (Patient not taking: Reported on 01/12/2021)     No current facility-administered medications on file prior to visit.    There are no Patient Instructions on file for this visit. No follow-ups on file.   Kris Hartmann, NP

## 2021-01-24 ENCOUNTER — Other Ambulatory Visit: Payer: Self-pay | Admitting: Podiatry

## 2021-01-25 ENCOUNTER — Encounter
Admission: RE | Admit: 2021-01-25 | Discharge: 2021-01-25 | Disposition: A | Discharge status: Home / Self Care | Payer: 59 | Source: Ambulatory Visit | Attending: Podiatry | Admitting: Podiatry

## 2021-01-25 ENCOUNTER — Other Ambulatory Visit: Payer: Self-pay

## 2021-01-25 HISTORY — DX: Peripheral vascular disease, unspecified: I73.9

## 2021-01-25 HISTORY — DX: COVID-19: U07.1

## 2021-01-25 NOTE — Patient Instructions (Signed)
Your procedure is scheduled on: 01/27/21 Report to DAY SURGERY DEPARTMENT LOCATED ON 2ND FLOOR MEDICAL MALL ENTRANCE. To find out your arrival time please call [phone] between 1PM - 3PM on 01/26/21.  Remember: Instructions that are not followed completely may result in serious medical risk, up to and including death, or upon the discretion of your surgeon and anesthesiologist your surgery may need to be rescheduled.     _X__ 1. Do not eat food after midnight the night before your procedure.                 No gum chewing or hard candies. You may drink clear liquids up to 2 hours                 before you are scheduled to arrive for your surgery-                   Diabetics water only  __X__2.  On the morning of surgery brush your teeth with toothpaste and water, you                 may rinse your mouth with mouthwash if you wish.  Do not swallow any              toothpaste of mouthwash.     _X__ 3.  No Alcohol for 24 hours before or after surgery.   _X__ 4.  Do Not Smoke or use e-cigarettes For 24 Hours Prior to Your Surgery.                 Do not use any chewable tobacco products for at least 6 hours prior to                 surgery.  ____  5.  Bring all medications with you on the day of surgery if instructed.   __X__  6.  Notify your doctor if there is any change in your medical condition      (cold, fever, infections).     Do not wear jewelry, make-up, hairpins, clips or nail polish. Do not wear lotions, powders, or perfumes.  Do not shave body hair 48 hours prior to surgery. Men may shave face and neck. Do not bring valuables to the hospital.    [facility] is not responsible for any belongings or valuables.  Contacts, dentures/partials or body piercings may not be worn into surgery. Bring a case for your contacts, glasses or hearing aids, a denture cup will be supplied. Leave your suitcase in the car. After surgery it may be brought to your room. For patients admitted  to the hospital, discharge time is determined by your treatment team.   Patients discharged the day of surgery will not be allowed to drive home.   Please read over the following fact sheets that you were given:     __X__ Take these medicines the morning of surgery with A SIP OF WATER:    1. none  2.   3.   4.  5.  6.  ____ Fleet Enema (as directed)   ____ Use CHG Soap/SAGE wipes as directed  ____ Use inhalers on the day of surgery  ____ Stop metformin/Janumet/Farxiga 2 days prior to surgery    ____ Take 1/2 of usual insulin dose the night before surgery. No insulin the morning          of surgery.   ____ Stop Blood Thinners Coumadin/Plavix/Xarelto/Pleta/Pradaxa/Eliquis/Effient/Aspirin  on  Or contact your Surgeon, Cardiologist or Medical Doctor regarding  ability to stop your blood thinners  __X__ Stop Anti-inflammatories 7 days before surgery such as Advil, Ibuprofen, Motrin,  BC or Goodies Powder, Naprosyn, Naproxen, Aleve,   You may take Tylenol if needed  __X__ Stop all herbals or supplements, fish oil or vitamins  until after surgery.    ____ Bring C-Pap to the hospital.   HOLD PLAVIX BEGINNING TODAY

## 2021-01-27 ENCOUNTER — Encounter
Admission: RE | Disposition: A | Discharge status: Home / Self Care | Payer: Self-pay | Source: Home / Self Care | Attending: Podiatry

## 2021-01-27 ENCOUNTER — Ambulatory Visit: Payer: 59 | Admitting: Certified Registered Nurse Anesthetist

## 2021-01-27 ENCOUNTER — Encounter: Payer: Self-pay | Admitting: Podiatry

## 2021-01-27 ENCOUNTER — Ambulatory Visit
Admission: RE | Admit: 2021-01-27 | Discharge: 2021-01-27 | Disposition: A | Discharge status: Home / Self Care | Payer: 59 | Attending: Podiatry | Admitting: Podiatry

## 2021-01-27 ENCOUNTER — Other Ambulatory Visit: Payer: Self-pay

## 2021-01-27 DIAGNOSIS — E1151 Type 2 diabetes mellitus with diabetic peripheral angiopathy without gangrene: Secondary | ICD-10-CM | POA: Diagnosis not present

## 2021-01-27 DIAGNOSIS — E1140 Type 2 diabetes mellitus with diabetic neuropathy, unspecified: Secondary | ICD-10-CM | POA: Diagnosis not present

## 2021-01-27 DIAGNOSIS — E1169 Type 2 diabetes mellitus with other specified complication: Secondary | ICD-10-CM | POA: Insufficient documentation

## 2021-01-27 DIAGNOSIS — M869 Osteomyelitis, unspecified: Secondary | ICD-10-CM | POA: Diagnosis not present

## 2021-01-27 HISTORY — PX: AMPUTATION TOE: SHX6595

## 2021-01-27 LAB — GLUCOSE, CAPILLARY
Glucose-Capillary: 135 mg/dL — ABNORMAL HIGH (ref 70–99)
Glucose-Capillary: 137 mg/dL — ABNORMAL HIGH (ref 70–99)

## 2021-01-27 SURGERY — AMPUTATION, TOE
Anesthesia: General | Site: Toe | Laterality: Right

## 2021-01-27 MED ORDER — CHLORHEXIDINE GLUCONATE 0.12 % MT SOLN: 15.0000 mL | Freq: Once | OROMUCOSAL | Status: AC

## 2021-01-27 MED ORDER — PROPOFOL 10 MG/ML IV BOLUS
INTRAVENOUS | Status: AC
  Filled 2021-01-27: qty 40

## 2021-01-27 MED ORDER — ONDANSETRON HCL 4 MG/2ML IJ SOLN: 4.0000 mg | Freq: Once | INTRAMUSCULAR | Status: DC | PRN

## 2021-01-27 MED ORDER — OXYCODONE HCL 5 MG/5ML PO SOLN: 5.0000 mg | Freq: Once | ORAL | Status: DC | PRN

## 2021-01-27 MED ORDER — ORAL CARE MOUTH RINSE: 15.0000 mL | Freq: Once | OROMUCOSAL | Status: AC

## 2021-01-27 MED ORDER — CEFAZOLIN SODIUM-DEXTROSE 2-4 GM/100ML-% IV SOLN
INTRAVENOUS | Status: AC
  Filled 2021-01-27: qty 100

## 2021-01-27 MED ORDER — FENTANYL CITRATE (PF) 100 MCG/2ML IJ SOLN
INTRAMUSCULAR | Status: AC
  Filled 2021-01-27: qty 2

## 2021-01-27 MED ORDER — MIDAZOLAM HCL 2 MG/2ML IJ SOLN
INTRAMUSCULAR | Status: DC | PRN
  Administered 2021-01-27: 2 mg via INTRAVENOUS

## 2021-01-27 MED ORDER — SODIUM CHLORIDE 0.9 % IV SOLN: INTRAVENOUS | Status: DC

## 2021-01-27 MED ORDER — FENTANYL CITRATE (PF) 100 MCG/2ML IJ SOLN: 25.0000 ug | INTRAMUSCULAR | Status: DC | PRN

## 2021-01-27 MED ORDER — OXYCODONE HCL 5 MG PO TABS: 5.0000 mg | ORAL_TABLET | Freq: Once | ORAL | Status: DC | PRN

## 2021-01-27 MED ORDER — BUPIVACAINE HCL (PF) 0.5 % IJ SOLN
INTRAMUSCULAR | Status: AC
  Filled 2021-01-27: qty 30

## 2021-01-27 MED ORDER — CHLORHEXIDINE GLUCONATE 0.12 % MT SOLN
OROMUCOSAL | Status: AC
  Administered 2021-01-27: 08:00:00 15 mL via OROMUCOSAL
  Filled 2021-01-27: qty 15

## 2021-01-27 MED ORDER — PROPOFOL 500 MG/50ML IV EMUL
INTRAVENOUS | Status: DC | PRN
  Administered 2021-01-27: 100 ug/kg/min via INTRAVENOUS

## 2021-01-27 MED ORDER — LACTATED RINGERS IV SOLN: INTRAVENOUS | Status: DC

## 2021-01-27 MED ORDER — CEFAZOLIN SODIUM-DEXTROSE 2-4 GM/100ML-% IV SOLN
2.0000 g | INTRAVENOUS | Status: AC
  Administered 2021-01-27: 09:00:00 2 g via INTRAVENOUS

## 2021-01-27 MED ORDER — PROPOFOL 10 MG/ML IV BOLUS
INTRAVENOUS | Status: DC | PRN
  Administered 2021-01-27: 30 mg via INTRAVENOUS

## 2021-01-27 MED ORDER — BUPIVACAINE HCL 0.5 % IJ SOLN
INTRAMUSCULAR | Status: DC | PRN
  Administered 2021-01-27: 7 mL

## 2021-01-27 MED ORDER — 0.9 % SODIUM CHLORIDE (POUR BTL) OPTIME
TOPICAL | Status: DC | PRN
  Administered 2021-01-27: 09:00:00 500 mL

## 2021-01-27 MED ORDER — ACETAMINOPHEN 10 MG/ML IV SOLN: 1000.0000 mg | Freq: Once | INTRAVENOUS | Status: DC | PRN

## 2021-01-27 MED ORDER — GLYCOPYRROLATE 0.2 MG/ML IJ SOLN
INTRAMUSCULAR | Status: DC | PRN
  Administered 2021-01-27: .2 mg via INTRAVENOUS

## 2021-01-27 MED ORDER — PHENYLEPHRINE 40 MCG/ML (10ML) SYRINGE FOR IV PUSH (FOR BLOOD PRESSURE SUPPORT)
PREFILLED_SYRINGE | INTRAVENOUS | Status: DC | PRN
Start: 2021-01-27 — End: 2021-01-27
  Administered 2021-01-27 (×5): 80 ug via INTRAVENOUS

## 2021-01-27 SURGICAL SUPPLY — 32 items
BLADE SURG 15 STRL LF DISP TIS (BLADE) ×2 IMPLANT
BLADE SURG 15 STRL SS (BLADE) ×2
BLADE SURG MINI STRL (BLADE) ×2 IMPLANT
BNDG CONFORM 3 STRL LF (GAUZE/BANDAGES/DRESSINGS) ×2 IMPLANT
BNDG ELASTIC 4X5.8 VLCR STR LF (GAUZE/BANDAGES/DRESSINGS) ×2 IMPLANT
BNDG ESMARK 4X12 TAN STRL LF (GAUZE/BANDAGES/DRESSINGS) ×2 IMPLANT
BNDG GAUZE ELAST 4 BULKY (GAUZE/BANDAGES/DRESSINGS) ×2 IMPLANT
DURAPREP 26ML APPLICATOR (WOUND CARE) ×2 IMPLANT
ELECT REM PT RETURN 9FT ADLT (ELECTROSURGICAL) ×2
ELECTRODE REM PT RTRN 9FT ADLT (ELECTROSURGICAL) ×1 IMPLANT
GAUZE 4X4 16PLY ~~LOC~~+RFID DBL (SPONGE) ×2 IMPLANT
GAUZE SPONGE 4X4 12PLY STRL (GAUZE/BANDAGES/DRESSINGS) ×2 IMPLANT
GAUZE XEROFORM 1X8 LF (GAUZE/BANDAGES/DRESSINGS) ×2 IMPLANT
GLOVE SURG ENC MOIS LTX SZ7.5 (GLOVE) ×2 IMPLANT
GLOVE SURG UNDER LTX SZ8 (GLOVE) ×2 IMPLANT
GOWN STRL REUS W/ TWL LRG LVL3 (GOWN DISPOSABLE) ×2 IMPLANT
GOWN STRL REUS W/TWL LRG LVL3 (GOWN DISPOSABLE) ×2
KIT TURNOVER KIT A (KITS) ×2 IMPLANT
MANIFOLD NEPTUNE II (INSTRUMENTS) ×2 IMPLANT
NDL HYPO 25X1 1.5 SAFETY (NEEDLE) ×2 IMPLANT
NEEDLE HYPO 25X1 1.5 SAFETY (NEEDLE) ×4 IMPLANT
NS IRRIG 500ML POUR BTL (IV SOLUTION) ×2 IMPLANT
PACK EXTREMITY ARMC (MISCELLANEOUS) ×2 IMPLANT
SOL PREP PVP 2OZ (MISCELLANEOUS) ×2
SOLUTION PREP PVP 2OZ (MISCELLANEOUS) ×1 IMPLANT
STOCKINETTE STRL 6IN 960660 (GAUZE/BANDAGES/DRESSINGS) ×2 IMPLANT
SUT ETHILON 3-0 FS-10 30 BLK (SUTURE) ×2
SUT ETHILON 4-0 (SUTURE)
SUT ETHILON 4-0 FS2 18XMFL BLK (SUTURE)
SUTURE EHLN 3-0 FS-10 30 BLK (SUTURE) ×1 IMPLANT
SUTURE ETHLN 4-0 FS2 18XMF BLK (SUTURE) IMPLANT
SYR 10ML LL (SYRINGE) ×2 IMPLANT

## 2021-01-27 NOTE — Discharge Instructions (Addendum)
1.  Elevate the right lower extremity on pillows.  2.  Keep the bandage on the right foot clean, dry, and do not remove.  3.  Sponge bathe only right lower extremity.  4.  Wear the surgical shoe on the right foot whenever walking or standing.  5.  Take 1 pain pill, Norco, every 4 hours only if needed for pain.  AMBULATORY SURGERY  DISCHARGE INSTRUCTIONS   The drugs that you were given will stay in your system until tomorrow so for the next 24 hours you should not:  Drive an automobile Make any legal decisions Drink any alcoholic beverage   You may resume regular meals tomorrow.  Today it is better to start with liquids and gradually work up to solid foods.  You may eat anything you prefer, but it is better to start with liquids, then soup and crackers, and gradually work up to solid foods.   Please notify your doctor immediately if you have any unusual bleeding, trouble breathing, redness and pain at the surgery site, drainage, fever, or pain not relieved by medication.    Please contact your physician with any problems or Same Day Surgery at [phone], Monday through Friday 6 am to 4 pm, or [place] at [facility] number at [phone].

## 2021-01-27 NOTE — Anesthesia Procedure Notes (Signed)
Procedure Name: MAC Date/Time: 01/27/2021 8:57 AM Performed by: Lily Peer, Nicolas Sisler, CRNA Pre-anesthesia Checklist: Patient identified, Emergency Drugs available, Suction available, Patient being monitored and Timeout performed Patient Re-evaluated:Patient Re-evaluated prior to induction Oxygen Delivery Method: Simple face mask Induction Type: IV induction

## 2021-01-27 NOTE — Transfer of Care (Signed)
Immediate Anesthesia Transfer of Care Note  Patient: Fraser Din  Procedure(s) Performed: AMPUTATION TOE - 2ND MPJ (Right: Toe)  Patient Location: PACU  Anesthesia Type:MAC  Level of Consciousness: awake, alert  and oriented  Airway & Oxygen Therapy: Patient Spontanous Breathing  Post-op Assessment: Report given to RN and Post -op Vital signs reviewed and stable  Post vital signs: Reviewed and stable  Last Vitals:  Vitals Value Taken Time  BP 94/64   Temp    Pulse 72 01/27/21 0937  Resp 24 01/27/21 0937  SpO2 100 % 01/27/21 0937  Vitals shown include unvalidated device data.  Last Pain:  Vitals:   01/27/21 0836  TempSrc: Temporal  PainSc: 0-No pain         Complications: No notable events documented.

## 2021-01-27 NOTE — Anesthesia Preprocedure Evaluation (Addendum)
Anesthesia Evaluation  Patient identified by MRN, date of birth, ID band Patient awake    Reviewed: Allergy & Precautions, NPO status , Patient's Chart, lab work & pertinent test results  History of Anesthesia Complications Negative for: history of anesthetic complications  Airway Mallampati: II   Neck ROM: Full    Dental  (+)    Pulmonary neg pulmonary ROS,    Pulmonary exam normal breath sounds clear to auscultation       Cardiovascular hypertension, + Peripheral Vascular Disease (on Plavix)  Normal cardiovascular exam Rhythm:Regular Rate:Normal  ECG 06/13/20: normal   Neuro/Psych  Neuromuscular disease (peripheral neuropathy)    GI/Hepatic negative GI ROS,   Endo/Other  diabetes, Type 2  Renal/GU negative Renal ROS     Musculoskeletal Left toe osteomyelitis   Abdominal   Peds  Hematology negative hematology ROS (+)   Anesthesia Other Findings   Reproductive/Obstetrics                            Anesthesia Physical Anesthesia Plan  ASA: 3  Anesthesia Plan: General   Post-op Pain Management:    Induction: Intravenous  PONV Risk Score and Plan: 2 and Propofol infusion, TIVA and Treatment may vary due to age or medical condition  Airway Management Planned: Natural Airway  Additional Equipment:   Intra-op Plan:   Post-operative Plan:   Informed Consent: I have reviewed the patients History and Physical, chart, labs and discussed the procedure including the risks, benefits and alternatives for the proposed anesthesia with the patient or authorized representative who has indicated his/her understanding and acceptance.       Plan Discussed with: CRNA  Anesthesia Plan Comments: (LMA/GETA backup discussed.  Patient consented for risks of anesthesia including but not limited to:  - adverse reactions to medications - damage to eyes, teeth, lips or other oral mucosa - nerve  damage due to positioning  - sore throat or hoarseness - damage to heart, brain, nerves, lungs, other parts of body or loss of life  Informed patient about role of CRNA in peri- and intra-operative care.  Patient voiced understanding.)        Anesthesia Quick Evaluation

## 2021-01-27 NOTE — H&P (Signed)
Subjective: Patient presents today for amputation of his right second toe.  Recent ulceration with bone clearly exposed through the tip of the toe and decision made for amputation.  Objective: Patient is completely neuropathic.  Pulses are diminished but intact.  Full-thickness ulceration distal tip of the right second toe with exposed bone.  Multiple previous amputations bilateral.  Assessment: Osteomyelitis right second toe.  Plan: Medical history and physical in the chart reviewed.  Patient stable to proceed with planned amputation of the right second toe.

## 2021-01-27 NOTE — Op Note (Signed)
Date of operation: 01/27/2021.  Surgeon: Durward Fortes D.P.M.  Preoperative diagnosis: Osteomyelitis right second toe.  Postoperative diagnosis: Same.  Procedure: Amputation right second toe.  Anesthesia: Local MAC.  Hemostasis: None.  Estimated blood loss: 20 cc.  Pathology: Right second toe.  Complications: None apparent.  Operative indications: This is a 60 year old male with recent development of ulceration with exposed bone on his right second toe.  Toe continued to deteriorate and decision was made for amputation of the second toe.  Operative procedure: Patient was taken to the operating room and placed on the table in the supine position.  Following satisfactory sedation the right forefoot was anesthetized with 7 cc of 0.5% Marcaine plain around the second metatarsal.  The foot was prepped and draped in the usual sterile fashion.  Attention was then directed to the distal right foot where an elliptical incision was made coursing dorsal to plantar around the medial and lateral base of the second toe.  Incision was carried sharply down to the level of bone and dissection carried back proximally to the joint where the toe was disarticulated and removed in toto.  Bleeders bovied and the wound was flushed with copious amounts of sterile saline.  The incision was then closed using 3-0 nylon vertical mattress and simple interrupted sutures.  Xeroform 4 x 4's and conformer applied to the right foot followed by Kerlix and an Ace.  Patient was awakened and transported to the PACU with vital signs stable and in good condition.

## 2021-01-27 NOTE — Anesthesia Postprocedure Evaluation (Signed)
Anesthesia Post Note  Patient: Scott Howard  Procedure(s) Performed: AMPUTATION TOE - 2ND MPJ (Right: Toe)  Patient location during evaluation: PACU Anesthesia Type: General Level of consciousness: awake and alert, oriented and patient cooperative Pain management: pain level controlled Vital Signs Assessment: post-procedure vital signs reviewed and stable Respiratory status: spontaneous breathing, nonlabored ventilation and respiratory function stable Cardiovascular status: blood pressure returned to baseline and stable Postop Assessment: adequate PO intake Anesthetic complications: no   No notable events documented.   Last Vitals:  Vitals:   01/27/21 0953 01/27/21 1001  BP: 128/77 126/76  Pulse: 74 76  Resp: 16 18  Temp: (!) 36.4 C (!) 36.1 C  SpO2: 98% 97%    Last Pain:  Vitals:   01/27/21 1001  TempSrc: Temporal  PainSc: 0-No pain                 Reed Breech

## 2021-01-27 NOTE — Interval H&P Note (Signed)
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  The various methods of treatment have been discussed with the patient and family. After consideration of risks, benefits and other options for treatment, the patient has consented to  Procedure(s): AMPUTATION TOE - 2ND MPJ (Right) as a surgical intervention.  The patient's history has been reviewed, patient examined, no change in status, stable for surgery.  I have reviewed the patient's chart and labs.  Questions were answered to the patient's satisfaction.     [name]

## 2021-01-31 LAB — SURGICAL PATHOLOGY

## 2021-03-21 ENCOUNTER — Encounter (HOSPITAL_COMMUNITY): Payer: Self-pay

## 2021-03-21 ENCOUNTER — Observation Stay (HOSPITAL_BASED_OUTPATIENT_CLINIC_OR_DEPARTMENT_OTHER): Payer: 59 | Admitting: Certified Registered Nurse Anesthetist

## 2021-03-21 ENCOUNTER — Emergency Department (HOSPITAL_COMMUNITY): Payer: 59

## 2021-03-21 ENCOUNTER — Observation Stay (HOSPITAL_COMMUNITY): Payer: 59 | Admitting: Certified Registered Nurse Anesthetist

## 2021-03-21 ENCOUNTER — Other Ambulatory Visit: Payer: Self-pay

## 2021-03-21 ENCOUNTER — Encounter (HOSPITAL_COMMUNITY)
Admission: EM | Disposition: A | Discharge status: Home / Self Care | Payer: Self-pay | Source: Home / Self Care | Attending: Emergency Medicine

## 2021-03-21 ENCOUNTER — Observation Stay (HOSPITAL_COMMUNITY): Payer: 59

## 2021-03-21 ENCOUNTER — Observation Stay (HOSPITAL_COMMUNITY)
Admission: EM | Admit: 2021-03-21 | Discharge: 2021-03-22 | Disposition: A | Discharge status: Home / Self Care | Payer: 59 | Attending: Orthopedic Surgery | Admitting: Orthopedic Surgery

## 2021-03-21 DIAGNOSIS — S82892A Other fracture of left lower leg, initial encounter for closed fracture: Secondary | ICD-10-CM | POA: Diagnosis present

## 2021-03-21 DIAGNOSIS — S0990XA Unspecified injury of head, initial encounter: Secondary | ICD-10-CM | POA: Insufficient documentation

## 2021-03-21 DIAGNOSIS — W11XXXA Fall on and from ladder, initial encounter: Secondary | ICD-10-CM | POA: Diagnosis not present

## 2021-03-21 DIAGNOSIS — E1151 Type 2 diabetes mellitus with diabetic peripheral angiopathy without gangrene: Secondary | ICD-10-CM | POA: Diagnosis not present

## 2021-03-21 DIAGNOSIS — Z7984 Long term (current) use of oral hypoglycemic drugs: Secondary | ICD-10-CM | POA: Diagnosis not present

## 2021-03-21 DIAGNOSIS — Z79899 Other long term (current) drug therapy: Secondary | ICD-10-CM | POA: Diagnosis not present

## 2021-03-21 DIAGNOSIS — I10 Essential (primary) hypertension: Secondary | ICD-10-CM

## 2021-03-21 DIAGNOSIS — Z8616 Personal history of COVID-19: Secondary | ICD-10-CM | POA: Insufficient documentation

## 2021-03-21 DIAGNOSIS — W19XXXA Unspecified fall, initial encounter: Secondary | ICD-10-CM

## 2021-03-21 DIAGNOSIS — S82852A Displaced trimalleolar fracture of left lower leg, initial encounter for closed fracture: Principal | ICD-10-CM | POA: Diagnosis present

## 2021-03-21 DIAGNOSIS — E119 Type 2 diabetes mellitus without complications: Secondary | ICD-10-CM | POA: Insufficient documentation

## 2021-03-21 DIAGNOSIS — T1490XA Injury, unspecified, initial encounter: Secondary | ICD-10-CM

## 2021-03-21 DIAGNOSIS — Z7982 Long term (current) use of aspirin: Secondary | ICD-10-CM | POA: Diagnosis not present

## 2021-03-21 DIAGNOSIS — S9305XA Dislocation of left ankle joint, initial encounter: Secondary | ICD-10-CM | POA: Diagnosis present

## 2021-03-21 HISTORY — PX: ORIF ANKLE FRACTURE: SHX5408

## 2021-03-21 LAB — SAMPLE TO BLOOD BANK

## 2021-03-21 LAB — PROTIME-INR
INR: 1 (ref 0.8–1.2)
Prothrombin Time: 12.8 seconds (ref 11.4–15.2)

## 2021-03-21 LAB — CBC
HCT: 44.1 % (ref 39.0–52.0)
Hemoglobin: 14.5 g/dL (ref 13.0–17.0)
MCH: 30.3 pg (ref 26.0–34.0)
MCHC: 32.9 g/dL (ref 30.0–36.0)
MCV: 92.3 fL (ref 80.0–100.0)
Platelets: 172 10*3/uL (ref 150–400)
RBC: 4.78 MIL/uL (ref 4.22–5.81)
RDW: 12.1 % (ref 11.5–15.5)
WBC: 7.7 10*3/uL (ref 4.0–10.5)
nRBC: 0 % (ref 0.0–0.2)

## 2021-03-21 LAB — I-STAT CHEM 8, ED
BUN: 32 mg/dL — ABNORMAL HIGH (ref 8–23)
Calcium, Ion: 1.11 mmol/L — ABNORMAL LOW (ref 1.15–1.40)
Chloride: 105 mmol/L (ref 98–111)
Creatinine, Ser: 1.3 mg/dL — ABNORMAL HIGH (ref 0.61–1.24)
Glucose, Bld: 116 mg/dL — ABNORMAL HIGH (ref 70–99)
HCT: 43 % (ref 39.0–52.0)
Hemoglobin: 14.6 g/dL (ref 13.0–17.0)
Potassium: 4.8 mmol/L (ref 3.5–5.1)
Sodium: 138 mmol/L (ref 135–145)
TCO2: 25 mmol/L (ref 22–32)

## 2021-03-21 LAB — SURGICAL PCR SCREEN
MRSA, PCR: NEGATIVE
Staphylococcus aureus: NEGATIVE

## 2021-03-21 LAB — COMPREHENSIVE METABOLIC PANEL
ALT: 18 U/L (ref 0–44)
AST: 19 U/L (ref 15–41)
Albumin: 3.8 g/dL (ref 3.5–5.0)
Alkaline Phosphatase: 53 U/L (ref 38–126)
Anion gap: 7 (ref 5–15)
BUN: 30 mg/dL — ABNORMAL HIGH (ref 8–23)
CO2: 26 mmol/L (ref 22–32)
Calcium: 8.8 mg/dL — ABNORMAL LOW (ref 8.9–10.3)
Chloride: 105 mmol/L (ref 98–111)
Creatinine, Ser: 1.25 mg/dL — ABNORMAL HIGH (ref 0.61–1.24)
GFR, Estimated: >60 mL/min (ref >60)
Glucose, Bld: 121 mg/dL — ABNORMAL HIGH (ref 70–99)
Potassium: 4.9 mmol/L (ref 3.5–5.1)
Sodium: 138 mmol/L (ref 135–145)
Total Bilirubin: 1.1 mg/dL (ref 0.3–1.2)
Total Protein: 7 g/dL (ref 6.5–8.1)

## 2021-03-21 LAB — LACTIC ACID, PLASMA: Lactic Acid, Venous: 0.9 mmol/L (ref 0.5–1.9)

## 2021-03-21 LAB — GLUCOSE, CAPILLARY
Glucose-Capillary: 100 mg/dL — ABNORMAL HIGH (ref 70–99)
Glucose-Capillary: 100 mg/dL — ABNORMAL HIGH (ref 70–99)
Glucose-Capillary: 114 mg/dL — ABNORMAL HIGH (ref 70–99)

## 2021-03-21 LAB — ETHANOL: Alcohol, Ethyl (B): <10 mg/dL (ref <10)

## 2021-03-21 SURGERY — OPEN REDUCTION INTERNAL FIXATION (ORIF) ANKLE FRACTURE
Anesthesia: General | Site: Ankle | Laterality: Left

## 2021-03-21 MED ORDER — TRANEXAMIC ACID-NACL 1000-0.7 MG/100ML-% IV SOLN
INTRAVENOUS | Status: AC
  Filled 2021-03-21: qty 100

## 2021-03-21 MED ORDER — ONDANSETRON HCL 4 MG/2ML IJ SOLN: 4.0000 mg | Freq: Four times a day (QID) | INTRAMUSCULAR | Status: DC | PRN

## 2021-03-21 MED ORDER — BUPIVACAINE LIPOSOME 1.3 % IJ SUSP
INTRAMUSCULAR | Status: AC
  Filled 2021-03-21: qty 20

## 2021-03-21 MED ORDER — ONDANSETRON HCL 4 MG PO TABS: 4.0000 mg | ORAL_TABLET | Freq: Four times a day (QID) | ORAL | Status: DC | PRN

## 2021-03-21 MED ORDER — ONDANSETRON HCL 4 MG/2ML IJ SOLN
4.0000 mg | Freq: Once | INTRAMUSCULAR | Status: AC
  Administered 2021-03-21: 4 mg via INTRAVENOUS
  Filled 2021-03-21: qty 2

## 2021-03-21 MED ORDER — CHLORHEXIDINE GLUCONATE 4 % EX LIQD: 60.0000 mL | Freq: Once | CUTANEOUS | Status: DC

## 2021-03-21 MED ORDER — PROMETHAZINE HCL 25 MG/ML IJ SOLN
6.2500 mg | INTRAMUSCULAR | Status: DC | PRN
  Administered 2021-03-21: 6.25 mg via INTRAVENOUS

## 2021-03-21 MED ORDER — SODIUM CHLORIDE 0.9 % IV BOLUS
1000.0000 mL | Freq: Once | INTRAVENOUS | Status: AC
  Administered 2021-03-21: 1000 mL via INTRAVENOUS

## 2021-03-21 MED ORDER — ROCURONIUM BROMIDE 10 MG/ML (PF) SYRINGE
PREFILLED_SYRINGE | INTRAVENOUS | Status: AC
  Filled 2021-03-21: qty 10

## 2021-03-21 MED ORDER — PROPOFOL 10 MG/ML IV BOLUS
INTRAVENOUS | Status: AC
  Administered 2021-03-21: 60 mg
  Filled 2021-03-21: qty 20

## 2021-03-21 MED ORDER — ENOXAPARIN SODIUM 40 MG/0.4ML IJ SOSY: 40.0000 mg | PREFILLED_SYRINGE | INTRAMUSCULAR | Status: DC

## 2021-03-21 MED ORDER — BUPIVACAINE LIPOSOME 1.3 % IJ SUSP
INTRAMUSCULAR | Status: DC | PRN
  Administered 2021-03-21: 30 mL

## 2021-03-21 MED ORDER — FENTANYL CITRATE (PF) 100 MCG/2ML IJ SOLN
INTRAMUSCULAR | Status: AC
  Filled 2021-03-21: qty 2

## 2021-03-21 MED ORDER — ONDANSETRON HCL 4 MG/2ML IJ SOLN
INTRAMUSCULAR | Status: AC
  Filled 2021-03-21: qty 2

## 2021-03-21 MED ORDER — BUPIVACAINE HCL (PF) 0.25 % IJ SOLN
INTRAMUSCULAR | Status: AC
  Filled 2021-03-21: qty 30

## 2021-03-21 MED ORDER — ACETAMINOPHEN 325 MG PO TABS: 325.0000 mg | ORAL_TABLET | Freq: Four times a day (QID) | ORAL | Status: DC | PRN

## 2021-03-21 MED ORDER — IOHEXOL 300 MG/ML  SOLN
100.0000 mL | Freq: Once | INTRAMUSCULAR | Status: AC | PRN
  Administered 2021-03-21: 100 mL via INTRAVENOUS

## 2021-03-21 MED ORDER — LIDOCAINE 2% (20 MG/ML) 5 ML SYRINGE
INTRAMUSCULAR | Status: DC | PRN
  Administered 2021-03-21: 40 mg via INTRAVENOUS

## 2021-03-21 MED ORDER — SUCCINYLCHOLINE CHLORIDE 200 MG/10ML IV SOSY
PREFILLED_SYRINGE | INTRAVENOUS | Status: AC
  Filled 2021-03-21: qty 10

## 2021-03-21 MED ORDER — TRANEXAMIC ACID-NACL 1000-0.7 MG/100ML-% IV SOLN
1000.0000 mg | INTRAVENOUS | Status: AC
  Administered 2021-03-21: 1000 mg via INTRAVENOUS

## 2021-03-21 MED ORDER — ACETAMINOPHEN 10 MG/ML IV SOLN
INTRAVENOUS | Status: DC | PRN
Start: 2021-03-21 — End: 2021-03-21
  Administered 2021-03-21: 1000 mg via INTRAVENOUS

## 2021-03-21 MED ORDER — FENTANYL CITRATE (PF) 250 MCG/5ML IJ SOLN
INTRAMUSCULAR | Status: AC
  Filled 2021-03-21: qty 5

## 2021-03-21 MED ORDER — OXYCODONE HCL 5 MG PO TABS: 5.0000 mg | ORAL_TABLET | ORAL | Status: DC | PRN

## 2021-03-21 MED ORDER — OXYCODONE HCL 5 MG/5ML PO SOLN: 5.0000 mg | Freq: Once | ORAL | Status: DC | PRN

## 2021-03-21 MED ORDER — EPHEDRINE SULFATE-NACL 50-0.9 MG/10ML-% IV SOSY
PREFILLED_SYRINGE | INTRAVENOUS | Status: DC | PRN
  Administered 2021-03-21: 5 mg via INTRAVENOUS
  Administered 2021-03-21: 2.5 mg via INTRAVENOUS

## 2021-03-21 MED ORDER — VANCOMYCIN HCL 500 MG IV SOLR
INTRAVENOUS | Status: AC
  Filled 2021-03-21: qty 10

## 2021-03-21 MED ORDER — DOCUSATE SODIUM 100 MG PO CAPS
100.0000 mg | ORAL_CAPSULE | Freq: Two times a day (BID) | ORAL | Status: DC
  Administered 2021-03-21 – 2021-03-22 (×2): 100 mg via ORAL
  Filled 2021-03-21 (×2): qty 1

## 2021-03-21 MED ORDER — HYDROMORPHONE HCL 1 MG/ML IJ SOLN: 0.2500 mg | INTRAMUSCULAR | Status: DC | PRN

## 2021-03-21 MED ORDER — PROPOFOL 10 MG/ML IV BOLUS
INTRAVENOUS | Status: AC
  Filled 2021-03-21: qty 20

## 2021-03-21 MED ORDER — DEXAMETHASONE SODIUM PHOSPHATE 10 MG/ML IJ SOLN
INTRAMUSCULAR | Status: AC
  Filled 2021-03-21: qty 1

## 2021-03-21 MED ORDER — SODIUM CHLORIDE 0.9 % IV SOLN: INTRAVENOUS | Status: DC

## 2021-03-21 MED ORDER — ASPIRIN EC 81 MG PO TBEC
81.0000 mg | DELAYED_RELEASE_TABLET | Freq: Every day | ORAL | Status: DC
  Administered 2021-03-21 – 2021-03-22 (×2): 81 mg via ORAL
  Filled 2021-03-21 (×2): qty 1

## 2021-03-21 MED ORDER — CEFAZOLIN SODIUM-DEXTROSE 2-4 GM/100ML-% IV SOLN
2.0000 g | INTRAVENOUS | Status: AC
  Administered 2021-03-21: 2 g via INTRAVENOUS

## 2021-03-21 MED ORDER — LACTATED RINGERS IV SOLN
INTRAVENOUS | Status: DC | PRN
Start: 2021-03-21 — End: 2021-03-21

## 2021-03-21 MED ORDER — OXYCODONE HCL 5 MG PO TABS
10.0000 mg | ORAL_TABLET | ORAL | Status: DC | PRN
  Administered 2021-03-21: 15 mg via ORAL
  Administered 2021-03-22 (×2): 10 mg via ORAL
  Filled 2021-03-21 (×2): qty 3

## 2021-03-21 MED ORDER — SODIUM CHLORIDE (PF) 0.9 % IJ SOLN
INTRAMUSCULAR | Status: AC
  Filled 2021-03-21: qty 10

## 2021-03-21 MED ORDER — FENTANYL CITRATE (PF) 250 MCG/5ML IJ SOLN
INTRAMUSCULAR | Status: DC | PRN
  Administered 2021-03-21 (×5): 20 ug via INTRAVENOUS
  Administered 2021-03-21: 30 ug via INTRAVENOUS
  Administered 2021-03-21: 100 ug via INTRAVENOUS
  Administered 2021-03-21: 20 ug via INTRAVENOUS

## 2021-03-21 MED ORDER — CLOPIDOGREL BISULFATE 75 MG PO TABS
75.0000 mg | ORAL_TABLET | Freq: Every day | ORAL | Status: DC
  Administered 2021-03-21 – 2021-03-22 (×2): 75 mg via ORAL
  Filled 2021-03-21 (×3): qty 1

## 2021-03-21 MED ORDER — PHENYLEPHRINE HCL-NACL 20-0.9 MG/250ML-% IV SOLN
INTRAVENOUS | Status: DC | PRN
  Administered 2021-03-21: 30 ug/min via INTRAVENOUS

## 2021-03-21 MED ORDER — MIDAZOLAM HCL 2 MG/2ML IJ SOLN: 0.5000 mg | Freq: Once | INTRAMUSCULAR | Status: DC | PRN

## 2021-03-21 MED ORDER — PHENYLEPHRINE 40 MCG/ML (10ML) SYRINGE FOR IV PUSH (FOR BLOOD PRESSURE SUPPORT)
PREFILLED_SYRINGE | INTRAVENOUS | Status: AC
  Filled 2021-03-21: qty 10

## 2021-03-21 MED ORDER — METHOCARBAMOL 500 MG PO TABS: 500.0000 mg | ORAL_TABLET | Freq: Four times a day (QID) | ORAL | Status: DC | PRN

## 2021-03-21 MED ORDER — OXYCODONE HCL 5 MG PO TABS
5.0000 mg | ORAL_TABLET | ORAL | Status: DC | PRN
  Filled 2021-03-21: qty 2

## 2021-03-21 MED ORDER — PROMETHAZINE HCL 25 MG/ML IJ SOLN
INTRAMUSCULAR | Status: AC
  Filled 2021-03-21: qty 1

## 2021-03-21 MED ORDER — 0.9 % SODIUM CHLORIDE (POUR BTL) OPTIME
TOPICAL | Status: DC | PRN
  Administered 2021-03-21: 1000 mL

## 2021-03-21 MED ORDER — GLIPIZIDE 5 MG PO TABS
5.0000 mg | ORAL_TABLET | Freq: Two times a day (BID) | ORAL | Status: DC
  Administered 2021-03-22: 5 mg via ORAL
  Filled 2021-03-21: qty 1

## 2021-03-21 MED ORDER — ACETAMINOPHEN 10 MG/ML IV SOLN
INTRAVENOUS | Status: AC
  Filled 2021-03-21: qty 100

## 2021-03-21 MED ORDER — SENNA 8.6 MG PO TABS
1.0000 | ORAL_TABLET | Freq: Two times a day (BID) | ORAL | Status: DC
  Administered 2021-03-21 – 2021-03-22 (×2): 8.6 mg via ORAL
  Filled 2021-03-21 (×2): qty 1

## 2021-03-21 MED ORDER — DIPHENHYDRAMINE HCL 12.5 MG/5ML PO ELIX: 12.5000 mg | ORAL_SOLUTION | ORAL | Status: DC | PRN

## 2021-03-21 MED ORDER — PROPOFOL 10 MG/ML IV BOLUS
INTRAVENOUS | Status: DC | PRN
  Administered 2021-03-21: 150 mg via INTRAVENOUS

## 2021-03-21 MED ORDER — EPHEDRINE 5 MG/ML INJ
INTRAVENOUS | Status: AC
  Filled 2021-03-21: qty 5

## 2021-03-21 MED ORDER — MORPHINE SULFATE (PF) 2 MG/ML IV SOLN
2.0000 mg | INTRAVENOUS | Status: DC | PRN
  Administered 2021-03-21: 2 mg via INTRAVENOUS
  Filled 2021-03-21: qty 1

## 2021-03-21 MED ORDER — POVIDONE-IODINE 10 % EX SWAB: 2.0000 "application " | Freq: Once | CUTANEOUS | Status: DC

## 2021-03-21 MED ORDER — METHOCARBAMOL 1000 MG/10ML IJ SOLN
500.0000 mg | Freq: Four times a day (QID) | INTRAVENOUS | Status: DC | PRN
  Filled 2021-03-21 (×2): qty 5

## 2021-03-21 MED ORDER — ONDANSETRON HCL 4 MG/2ML IJ SOLN
INTRAMUSCULAR | Status: DC | PRN
  Administered 2021-03-21: 4 mg via INTRAVENOUS

## 2021-03-21 MED ORDER — CEFAZOLIN SODIUM-DEXTROSE 2-4 GM/100ML-% IV SOLN
INTRAVENOUS | Status: AC
  Filled 2021-03-21: qty 100

## 2021-03-21 MED ORDER — MEPERIDINE HCL 25 MG/ML IJ SOLN: 6.2500 mg | INTRAMUSCULAR | Status: DC | PRN

## 2021-03-21 MED ORDER — LIDOCAINE 2% (20 MG/ML) 5 ML SYRINGE
INTRAMUSCULAR | Status: AC
  Filled 2021-03-21: qty 5

## 2021-03-21 MED ORDER — DEXAMETHASONE SODIUM PHOSPHATE 10 MG/ML IJ SOLN
INTRAMUSCULAR | Status: DC | PRN
  Administered 2021-03-21: 10 mg via INTRAVENOUS

## 2021-03-21 MED ORDER — VANCOMYCIN HCL 500 MG IV SOLR
INTRAVENOUS | Status: DC | PRN
  Administered 2021-03-21: 500 mg via TOPICAL

## 2021-03-21 MED ORDER — HYDROMORPHONE HCL 1 MG/ML IJ SOLN: 0.5000 mg | INTRAMUSCULAR | Status: DC | PRN

## 2021-03-21 MED ORDER — INSULIN ASPART 100 UNIT/ML IJ SOLN: 0.0000 [IU] | INTRAMUSCULAR | Status: DC | PRN

## 2021-03-21 MED ORDER — LISINOPRIL 20 MG PO TABS
20.0000 mg | ORAL_TABLET | Freq: Every day | ORAL | Status: DC
  Administered 2021-03-21: 20 mg via ORAL
  Filled 2021-03-21: qty 1

## 2021-03-21 MED ORDER — CHLORHEXIDINE GLUCONATE 0.12 % MT SOLN
OROMUCOSAL | Status: AC
Start: 2021-03-21 — End: 2021-03-22
  Filled 2021-03-21: qty 15

## 2021-03-21 MED ORDER — INSULIN ASPART 100 UNIT/ML IJ SOLN
0.0000 [IU] | Freq: Three times a day (TID) | INTRAMUSCULAR | Status: DC
  Administered 2021-03-22: 5 [IU] via SUBCUTANEOUS

## 2021-03-21 MED ORDER — METHOCARBAMOL 1000 MG/10ML IJ SOLN
500.0000 mg | Freq: Four times a day (QID) | INTRAVENOUS | Status: DC | PRN
  Filled 2021-03-21: qty 5

## 2021-03-21 MED ORDER — MIDAZOLAM HCL 2 MG/2ML IJ SOLN
INTRAMUSCULAR | Status: AC
  Filled 2021-03-21: qty 2

## 2021-03-21 MED ORDER — OXYCODONE HCL 5 MG PO TABS: 5.0000 mg | ORAL_TABLET | Freq: Once | ORAL | Status: DC | PRN

## 2021-03-21 MED ORDER — PHENYLEPHRINE HCL (PRESSORS) 10 MG/ML IV SOLN
INTRAVENOUS | Status: DC | PRN
  Administered 2021-03-21 (×2): 40 ug via INTRAVENOUS
  Administered 2021-03-21: 80 ug via INTRAVENOUS

## 2021-03-21 SURGICAL SUPPLY — 64 items
ALCOHOL 70% 16 OZ (MISCELLANEOUS) ×2 IMPLANT
BAG COUNTER SPONGE SURGICOUNT (BAG) ×2 IMPLANT
BANDAGE ESMARK 6X9 LF (GAUZE/BANDAGES/DRESSINGS) ×1 IMPLANT
BIT DRILL 2.5X2.75 QC CALB (BIT) ×1 IMPLANT
BIT DRILL 2.9 CANN QC NONSTRL (BIT) ×1 IMPLANT
BLADE SURG 15 STRL LF DISP TIS (BLADE) ×1 IMPLANT
BLADE SURG 15 STRL SS (BLADE) ×1
BNDG COHESIVE 4X5 TAN ST LF (GAUZE/BANDAGES/DRESSINGS) ×1 IMPLANT
BNDG COHESIVE 4X5 TAN STRL (GAUZE/BANDAGES/DRESSINGS) ×1 IMPLANT
BNDG COHESIVE 6X5 TAN STRL LF (GAUZE/BANDAGES/DRESSINGS) ×1 IMPLANT
BNDG ELASTIC 4X5.8 VLCR STR LF (GAUZE/BANDAGES/DRESSINGS) ×1 IMPLANT
BNDG ELASTIC 6X5.8 VLCR STR LF (GAUZE/BANDAGES/DRESSINGS) ×1 IMPLANT
BNDG ESMARK 6X9 LF (GAUZE/BANDAGES/DRESSINGS)
CANISTER SUCT 3000ML PPV (MISCELLANEOUS) ×2 IMPLANT
CHLORAPREP W/TINT 26 (MISCELLANEOUS) ×4 IMPLANT
COVER SURGICAL LIGHT HANDLE (MISCELLANEOUS) ×2 IMPLANT
CUFF TOURN SGL QUICK 34 (TOURNIQUET CUFF) ×1
CUFF TOURN SGL QUICK 42 (TOURNIQUET CUFF) IMPLANT
CUFF TRNQT CYL 34X4.125X (TOURNIQUET CUFF) ×1 IMPLANT
DRAPE OEC MINIVIEW 54X84 (DRAPES) ×2 IMPLANT
DRAPE U-SHAPE 47X51 STRL (DRAPES) ×2 IMPLANT
DRSG MEPITEL 4X7.2 (GAUZE/BANDAGES/DRESSINGS) ×2 IMPLANT
DRSG PAD ABDOMINAL 8X10 ST (GAUZE/BANDAGES/DRESSINGS) ×5 IMPLANT
ELECT REM PT RETURN 9FT ADLT (ELECTROSURGICAL) ×2
ELECTRODE REM PT RTRN 9FT ADLT (ELECTROSURGICAL) ×1 IMPLANT
GAUZE SPONGE 4X4 12PLY STRL (GAUZE/BANDAGES/DRESSINGS) ×1 IMPLANT
GLOVE SRG 8 PF TXTR STRL LF DI (GLOVE) ×1 IMPLANT
GLOVE SURG ENC MOIS LTX SZ8 (GLOVE) ×2 IMPLANT
GLOVE SURG LTX SZ8 (GLOVE) ×2 IMPLANT
GLOVE SURG UNDER POLY LF SZ8 (GLOVE) ×1
GOWN STRL REUS W/ TWL LRG LVL3 (GOWN DISPOSABLE) ×1 IMPLANT
GOWN STRL REUS W/ TWL XL LVL3 (GOWN DISPOSABLE) ×2 IMPLANT
GOWN STRL REUS W/TWL LRG LVL3 (GOWN DISPOSABLE) ×1
GOWN STRL REUS W/TWL XL LVL3 (GOWN DISPOSABLE) ×2
K-WIRE ACE 1.6X6 (WIRE) ×4
KIT BASIN OR (CUSTOM PROCEDURE TRAY) ×2 IMPLANT
KIT TURNOVER KIT B (KITS) ×2 IMPLANT
KWIRE ACE 1.6X6 (WIRE) IMPLANT
NS IRRIG 1000ML POUR BTL (IV SOLUTION) ×2 IMPLANT
PACK ORTHO EXTREMITY (CUSTOM PROCEDURE TRAY) ×2 IMPLANT
PAD ARMBOARD 7.5X6 YLW CONV (MISCELLANEOUS) ×4 IMPLANT
PAD CAST 4YDX4 CTTN HI CHSV (CAST SUPPLIES) ×1 IMPLANT
PADDING CAST COTTON 4X4 STRL (CAST SUPPLIES) ×1
PADDING CAST COTTON 6X4 STRL (CAST SUPPLIES) ×1 IMPLANT
PLATE FIBULAR COMP LOCK 10H (Plate) ×1 IMPLANT
SCREW ACE CAN 4.0 40M (Screw) ×2 IMPLANT
SCREW ACE CAN 4.0 44M (Screw) ×1 IMPLANT
SCREW CORTICAL LOW PROF 3.5X20 (Screw) ×1 IMPLANT
SCREW LOCK CORT STAR 3.5X12 (Screw) ×1 IMPLANT
SCREW LOCK CORT STAR 3.5X14 (Screw) ×1 IMPLANT
SCREW LOCK CORT STAR 3.5X16 (Screw) ×1 IMPLANT
SCREW LOCK CORT STAR 3.5X18 (Screw) ×1 IMPLANT
SCREW LOW PROFILE 18MMX3.5MM (Screw) ×1 IMPLANT
SCREW LOW PROFILE 22MMX3.5MM (Screw) ×1 IMPLANT
SPONGE T-LAP 18X18 ~~LOC~~+RFID (SPONGE) ×2 IMPLANT
SUCTION FRAZIER HANDLE 10FR (MISCELLANEOUS) ×1
SUCTION TUBE FRAZIER 10FR DISP (MISCELLANEOUS) ×1 IMPLANT
SUT ETHILON 3 0 PS 1 (SUTURE) ×2 IMPLANT
SUT MNCRL AB 3-0 PS2 18 (SUTURE) IMPLANT
SUT VIC AB 2-0 CT1 27 (SUTURE) ×2
SUT VIC AB 2-0 CT1 TAPERPNT 27 (SUTURE) ×2 IMPLANT
TOWEL GREEN STERILE (TOWEL DISPOSABLE) ×2 IMPLANT
TOWEL GREEN STERILE FF (TOWEL DISPOSABLE) ×2 IMPLANT
TUBE CONNECTING 12X1/4 (SUCTIONS) ×2 IMPLANT

## 2021-03-21 NOTE — Transfer of Care (Signed)
Immediate Anesthesia Transfer of Care Note  Patient: Scott Howard  Procedure(s) Performed: OPEN REDUCTION INTERNAL FIXATION (ORIF) ANKLE FRACTURE (Left: Ankle)  Patient Location: PACU  Anesthesia Type:General  Level of Consciousness: awake, alert  and oriented  Airway & Oxygen Therapy: Patient Spontanous Breathing and Patient connected to nasal cannula oxygen  Post-op Assessment: Report given to RN, Post -op Vital signs reviewed and stable, Patient moving all extremities X 4 and Patient able to stick tongue midline  Post vital signs: Reviewed  Last Vitals:  Vitals Value Taken Time  BP 122/69 03/21/21 1822  Temp 98.0   Pulse 80 03/21/21 1824  Resp 22 03/21/21 1824  SpO2 97 % 03/21/21 1824  Vitals shown include unvalidated device data.  Last Pain:  Vitals:   03/21/21 1549  TempSrc: Oral  PainSc:          Complications: No notable events documented.

## 2021-03-21 NOTE — ED Notes (Signed)
CT delayed due to pt just receiving conscious sedation. Repeat x-rays to be performed in room prior to CT

## 2021-03-21 NOTE — ED Notes (Signed)
Pt given 100 mcg fentanyl PTA by EMS

## 2021-03-21 NOTE — ED Notes (Signed)
C-collar removed per Dr. Curatolo. 

## 2021-03-21 NOTE — Anesthesia Postprocedure Evaluation (Signed)
Anesthesia Post Note  Patient: Scott Howard  Procedure(s) Performed: OPEN REDUCTION INTERNAL FIXATION (ORIF) ANKLE FRACTURE (Left: Ankle)     Patient location during evaluation: PACU Anesthesia Type: General Level of consciousness: awake and alert Pain management: pain level controlled Vital Signs Assessment: post-procedure vital signs reviewed and stable Respiratory status: spontaneous breathing, nonlabored ventilation and respiratory function stable Cardiovascular status: blood pressure returned to baseline and stable Postop Assessment: no apparent nausea or vomiting Anesthetic complications: no   No notable events documented.  Last Vitals:  Vitals:   03/21/21 1922 03/21/21 1942  BP: 115/67 (!) 141/74  Pulse: 72 78  Resp: 15   Temp: 36.4 C (!) 36.4 C  SpO2: 99% 98%    Last Pain:  Vitals:   03/21/21 1942  TempSrc:   PainSc: 3                  Beryle Lathe

## 2021-03-21 NOTE — ED Notes (Signed)
Trauma Response Nurse Documentation   Scott Howard is a 61 y.o. male arriving to [facility] ED via EMS  On clopidogrel 75 mg daily. Trauma was activated as a Level 2 by charge RN based on the following trauma criteria Elderly patients > 65 with head trauma on anti-coagulation (excluding ASA) On clopidegril. Trauma team at the bedside on patient arrival. Patient cleared for CT by Dr. Ronnald Nian. Patient to CT with team. GCS 15.  History   Past Medical History:  Diagnosis Date   COVID-19    05/2020   Diabetes mellitus without complication ([place])    Heart murmur    Mild   Hyperlipidemia    borderline   Hypertension    Peripheral vascular disease [facility])      Past Surgical History:  Procedure Laterality Date   AMPUTATION Right 08/11/2020   Procedure: Right 1st and 5th Ray Resection;  Surgeon: [name], DPM;  Location: [facility] ORS;  Service: Podiatry;  Laterality: Right;   AMPUTATION TOE Left 05/29/2020   Procedure: 4th and 5th TOE AMPUTATION WITH PARTIAL RAY RESECTION;  Surgeon: [name], DPM;  Location: [facility] ORS;  Service: Podiatry;  Laterality: Left;   AMPUTATION TOE Right 01/27/2021   Procedure: AMPUTATION TOE - 2ND MPJ;  Surgeon: [name], DPM;  Location: [facility] ORS;  Service: Podiatry;  Laterality: Right;   CATARACT EXTRACTION W/PHACO Right 09/19/2020   Procedure: CATARACT EXTRACTION PHACO AND INTRAOCULAR LENS PLACEMENT ([place]) RIGHT INTRVITREAL TRIESCENCE DIABETIC;  Surgeon: Eulogio Bear, MD;  Location: [place];  Service: Ophthalmology;  Laterality: Right;  Diabetic - oral meds 4.55 00:36.1   CATARACT EXTRACTION W/PHACO Left 10/10/2020   Procedure: CATARACT EXTRACTION PHACO AND INTRAOCULAR LENS PLACEMENT ([facility]) LEFT INTRAVITREAL TRIESCENCE DIABETIC 4.86 00:38.7;  Surgeon: Eulogio Bear, MD;  Location: [place];  Service: Ophthalmology;  Laterality: Left;  Diabetic - oral meds   HAND SURGERY Left    LOWER EXTREMITY ANGIOGRAPHY Left 05/27/2020   Procedure:  Lower Extremity Angiography;  Surgeon: Katha Cabal, MD;  Location: [place] CV LAB;  Service: Cardiovascular;  Laterality: Left;   LOWER EXTREMITY ANGIOGRAPHY Right 05/31/2020   Procedure: Lower Extremity Angiography;  Surgeon: Katha Cabal, MD;  Location: [place] CV LAB;  Service: Cardiovascular;  Laterality: Right;   LOWER EXTREMITY ANGIOGRAPHY Right 08/10/2020   Procedure: Lower Extremity Angiography;  Surgeon: Katha Cabal, MD;  Location: [place] CV LAB;  Service: Cardiovascular;  Laterality: Right;   TONSILLECTOMY AND ADENOIDECTOMY         Initial Focused Assessment (If applicable, or please see trauma documentation):   CT's Completed:   CT Head and CT C-Spine  CT CAP, with T and L spine   Interventions:   Plan for disposition:  OR   Consults completed:  Orthopaedic Surgeon at 1144--Michael Jeffery PA at bedside at 1150.  Event Summary:  MTP Summary (If applicable):   Bedside handoff with ED RN Tama High, RN -- Primary RN.    Lezlie Octave Steph Cheadle  Trauma Response RN  Please call TRN at [phone] for further assistance.

## 2021-03-21 NOTE — Consult Note (Signed)
Reason for Consult:Left ankle fx Referring Physician: Virgina Norfolk Time called: 1052 Time at bedside: 1058   Scott Howard is an 61 y.o. male.  HPI: Keshaun was easing a large heavy box down some stairs when he lost his footing and fell backwards. He had immediate pain in his left ankle and noted it was grossly deformed. He was brought to the ED as a level 2 trauma activation 2/2 Plavix use. 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  Past Medical History:  Diagnosis Date   COVID-19    05/2020   Diabetes mellitus without complication (HCC)    Heart murmur    Mild   Hyperlipidemia    borderline   Hypertension    Peripheral vascular disease [facility])     Past Surgical History:  Procedure Laterality Date   AMPUTATION Right 08/11/2020   Procedure: Right 1st and 5th Ray Resection;  Surgeon: Linus Galas, DPM;  Location: [facility];  Service: Podiatry;  Laterality: Right;   AMPUTATION TOE Left 05/29/2020   Procedure: 4th and 5th TOE AMPUTATION WITH PARTIAL RAY RESECTION;  Surgeon: Linus Galas, DPM;  Location: [facility];  Service: Podiatry;  Laterality: Left;   AMPUTATION TOE Right 01/27/2021   Procedure: AMPUTATION TOE - 2ND MPJ;  Surgeon: Linus Galas, DPM;  Location: [facility];  Service: Podiatry;  Laterality: Right;   CATARACT EXTRACTION W/PHACO Right 09/19/2020   Procedure: CATARACT EXTRACTION PHACO AND INTRAOCULAR LENS PLACEMENT (IOC) RIGHT INTRVITREAL TRIESCENCE DIABETIC;  Surgeon: Nevada Crane, MD;  Location: [facility];  Service: Ophthalmology;  Laterality: Right;  Diabetic - oral meds 4.55 00:36.1   CATARACT EXTRACTION W/PHACO Left 10/10/2020   Procedure: CATARACT EXTRACTION PHACO AND INTRAOCULAR LENS PLACEMENT (IOC) LEFT INTRAVITREAL TRIESCENCE DIABETIC 4.86 00:38.7;  Surgeon: Nevada Crane, MD;  Location: [facility];  Service: Ophthalmology;  Laterality: Left;  Diabetic - oral meds   HAND SURGERY Left    LOWER EXTREMITY ANGIOGRAPHY Left 05/27/2020    Procedure: Lower Extremity Angiography;  Surgeon: Renford Dills, MD;  Location: [facility];  Service: Cardiovascular;  Laterality: Left;   LOWER EXTREMITY ANGIOGRAPHY Right 05/31/2020   Procedure: Lower Extremity Angiography;  Surgeon: Renford Dills, MD;  Location: [facility];  Service: Cardiovascular;  Laterality: Right;   LOWER EXTREMITY ANGIOGRAPHY Right 08/10/2020   Procedure: Lower Extremity Angiography;  Surgeon: Renford Dills, MD;  Location: [facility];  Service: Cardiovascular;  Laterality: Right;   TONSILLECTOMY AND ADENOIDECTOMY      Family History  Problem Relation Age of Onset   Cancer Mother    Cancer Father    Hypertension Brother    Congestive Heart Failure Brother     Social History:  reports that he has never smoked. He has never used smokeless tobacco. He reports current alcohol use. He reports that he does not use drugs.  Allergies:  Allergies  Allergen Reactions   Silvadene [Silver Sulfadiazine] Rash   Sulfa Antibiotics Rash   Linezolid Rash    Medications: I have reviewed the patient's current medications.  Results for orders placed or performed during the hospital encounter of 03/21/21 (from the past 48 hour(s))  I-Stat Chem 8, ED     Status: Abnormal   Collection Time: 03/21/21 11:05 AM  Result Value Ref Range   Sodium 138 135 - 145 mmol/L   Potassium 4.8 3.5 - 5.1 mmol/L   Chloride 105 98 - 111 mmol/L   BUN 32 (H) 8 -  23 mg/dL   Creatinine, Ser 6.97 (H) 0.61 - 1.24 mg/dL   Glucose, Bld 948 (H) 70 - 99 mg/dL    Comment: Glucose reference range applies only to samples taken after fasting for at least 8 hours.   Calcium, Ion 1.11 (L) 1.15 - 1.40 mmol/L   TCO2 25 22 - 32 mmol/L   Hemoglobin 14.6 13.0 - 17.0 g/dL   HCT 01.6 55.3 - 74.8 %  CBC     Status: None   Collection Time: 03/21/21 11:09 AM  Result Value Ref Range   WBC 7.7 4.0 - 10.5 K/uL   RBC 4.78 4.22 - 5.81 MIL/uL   Hemoglobin 14.5 13.0 - 17.0 g/dL    HCT 27.0 78.6 - 75.4 %   MCV 92.3 80.0 - 100.0 fL   MCH 30.3 26.0 - 34.0 pg   MCHC 32.9 30.0 - 36.0 g/dL   RDW 49.2 01.0 - 07.1 %   Platelets 172 150 - 400 K/uL   nRBC 0.0 0.0 - 0.2 %    Comment: Performed at [facility], [address] [place], [place] [address]    DG Pelvis Portable  Result Date: 03/21/2021 CLINICAL DATA:  Fall from ladder EXAM: PORTABLE PELVIS 1-2 VIEWS COMPARISON:  None. FINDINGS: There is no evidence of pelvic fracture or diastasis. No pelvic bone lesions are seen. Mild degenerative change in both hips. IMPRESSION: Negative. Electronically Signed   By: Marlan Palau M.D.   On: 03/21/2021 11:22   DG Chest Port 1 View  Result Date: 03/21/2021 CLINICAL DATA:  Fall from ladder EXAM: PORTABLE CHEST 1 VIEW COMPARISON:  06/13/2020 FINDINGS: The heart size and mediastinal contours are within normal limits. Both lungs are clear. The visualized skeletal structures are unremarkable. IMPRESSION: No active disease. Electronically Signed   By: Marlan Palau M.D.   On: 03/21/2021 11:21   DG Ankle Left Port  Result Date: 03/21/2021 CLINICAL DATA:  Fall from ladder EXAM: PORTABLE LEFT ANKLE - 2 VIEW COMPARISON:  None. FINDINGS: Fracture dislocation of the ankle. Comminuted and angulated and displaced fracture distal fibula. Fracture of the medial malleolus. One shaft width of lateral displacement of the talus relative to the tibia. 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 Electronically Signed   By: Marlan Palau M.D.   On: 03/21/2021 11:23    Review of Systems  HENT:  Negative for ear discharge, ear pain, hearing loss and tinnitus.   Eyes:  Negative for photophobia and pain.  Respiratory:  Negative for cough and shortness of breath.   Cardiovascular:  Negative for chest pain.  Gastrointestinal:  Negative for abdominal pain, nausea and vomiting.  Genitourinary:  Negative for dysuria, flank  pain, frequency and urgency.  Musculoskeletal:  Positive for arthralgias (Left ankle, min right wrist). Negative for back pain, myalgias and neck pain.  Neurological:  Negative for dizziness and headaches.  Hematological:  Does not bruise/bleed easily.  Psychiatric/Behavioral:  The patient is not nervous/anxious.   Blood pressure 115/70, pulse 71, temperature 98.1 F (36.7 C), temperature source Oral, resp. rate 17, height 5\' 6"  (1.676 m), weight 74.8 kg, SpO2 99 %. Physical Exam Constitutional:      General: He is not in acute distress.    Appearance: He is well-developed. He is not diaphoretic.  HENT:     Head: Normocephalic and atraumatic.  Eyes:     General: No scleral icterus.       Right eye:  No discharge.        Left eye: No discharge.     Conjunctiva/sclera: Conjunctivae normal.  Cardiovascular:     Rate and Rhythm: Normal rate and regular rhythm.  Pulmonary:     Effort: Pulmonary effort is normal. No respiratory distress.  Musculoskeletal:     Cervical back: Normal range of motion.     Comments: Right shoulder, elbow, wrist, digits- Punctate wound dorsal wrist, mild diffuse TTP, no instability, no blocks to motion  Sens  Ax/R/M/U intact  Mot   Ax/ R/ PIN/ M/ AIN/ U intact  Rad 2+  LLE No traumatic wounds, ecchymosis, or rash  Grossly deformed ankle, absent 4/5 toes  No knee effusion  Knee stable to varus/ valgus and anterior/posterior stress  Sens DPN, SPN, TN intact  Motor EHL, ext, flex, evers 5/5  DP dopplerable, No significant edema  Skin:    General: Skin is warm and dry.  Neurological:     Mental Status: He is alert.  Psychiatric:        Mood and Affect: Mood normal.        Behavior: Behavior normal.    Assessment/Plan: Left ankle fx -- Will reduce and splint. Plan for ORIF this afternoon with Dr. Victorino Dike. Right wrist pain -- X-rays negative.    Freeman Caldron, PA-C Orthopedic Surgery [phone] 03/21/2021, 11:27 AM

## 2021-03-21 NOTE — Anesthesia Procedure Notes (Addendum)
Procedure Name: LMA Insertion Date/Time: 03/21/2021 4:56 PM Performed by: Cy Blamer, CRNA Pre-anesthesia Checklist: Patient identified, Emergency Drugs available, Suction available, Patient being monitored and Timeout performed Patient Re-evaluated:Patient Re-evaluated prior to induction Oxygen Delivery Method: Circle system utilized Preoxygenation: Pre-oxygenation with 100% oxygen Induction Type: IV induction LMA: LMA inserted LMA Size: 5.0 Number of attempts: 1 Tube secured with: Tape Dental Injury: Teeth and Oropharynx as per pre-operative assessment

## 2021-03-21 NOTE — Op Note (Signed)
03/21/2021  6:23 PM  PATIENT:  Scott Howard  61 y.o. male  PRE-OPERATIVE DIAGNOSIS:  left ankle trimalleolar fracture  POST-OPERATIVE DIAGNOSIS:  same  Procedure(s): Open treatment left ankle trimalleolar fracture with internal fixation including fixation of the posterior lip Stress exam of the left ankle under fluoro AP, lateral and mortise xrays of the left ankle  SURGEON:  Toni Arthurs, MD  ASSISTANT: Alfredo Martinez, PA-C  ANESTHESIA:   General  EBL:  minimal   TOURNIQUET:   Total Tourniquet Time Documented: Thigh (Left) - 19 minutes Total: Thigh (Left) - 19 minutes  COMPLICATIONS:  None apparent  DISPOSITION:  Extubated, awake and stable to recovery.  INDICATION FOR PROCEDURE:  The patient is a 60 y/o male with PMH of diabetes who fell from his attic stairs and injured his left ankle earlier today.  He sustained a displaced trimalleolar ankle fracture dislocation and underwent closed reduction in the emergency room.  He presents now for operative treatment of this displaced and unstable left ankle injury.  The risks and benefits of the alternative treatment options have been discussed in detail.  The patient wishes to proceed with surgery and specifically understands risks of bleeding, infection, nerve damage, blood clots, need for additional surgery, amputation and death.   PROCEDURE IN DETAIL: After preoperative consent was obtained and the correct operative site was identified, the patient was brought to the operating room and placed supine on the operating table.  General anesthesia was induced.  Preoperative antibiotics were administered.  Surgical timeout was taken.  The left lower extremity was prepped and draped in standard sterile fashion with a tourniquet around the thigh.  The extremity was elevated and the tourniquet was inflated to 250 mmHg.  A longitudinal incision was made over the lateral malleolus.  Dissection was carried sharply down through the subcutaneous  tissues.  The fracture site was identified.  There was substantial comminution of the fibular fracture.  An attempt was made at closed reduction with tenaculae.  Unfortunately the comminution precluded reduction and the use of a lag screw or screws.  The decision was made to span the comminuted segment of fracture with a locking plate.    The posterior malleolus fracture was then reduced and held with a Weber clamp.  A K wire was placed from anterior to posterior across the fracture site.  An incision was made and blunt dissection carried down to the anterior tibia.  The K wire was overdrilled.  A partially-threaded 4 mm cannulated screw from the [employer] small frag set was inserted.  It was noted to secure the fracture site appropriately.  A 10 hole [employer] Alps composite plate was selected and was contoured to fit the lateral malleolus distally.  It was provisionally pinned distally and secured to the fibula proximally with a clamp.  AP and mortise radiographs confirmed appropriate restoration of the length of the fibula.  The plate was then secured to the fibula distally with locking and nonlocking screws.  Proximally the plate was secured with locking and nonlocking screws.  AP, mortise and lateral radiographs confirmed appropriate reduction of the fibular fracture in appropriate position and length of all hardware.  Attention was then turned to the medial malleolus.  An incision was made and dissection carried down through the subcutaneous tissues.  Care was taken to protect the saphenous nerve and vein.  The fracture site was cleaned of all periosteum and hematoma.  Fracture was reduced and held with a tenaculum.  Radiographs  confirmed appropriate reduction of the medial malleolus fracture.  2 K wires were inserted from the tip of the medial malleolus into the metaphyseal bone.  Two 4 mm x 40 mm partially-threaded cannulated screws were inserted.  Both were noted to have excellent purchase and  compressed the fracture site appropriately.  Final AP, lateral and mortise radiographs confirmed appropriate position and length of all hardware and appropriate reduction of the trimalleolar fracture.  A stress examination was then performed with no widening evident at the ankle mortise.  The wounds were irrigated copiously and sprinkled with vancomycin powder.  Subcutaneous tissues were approximated with 2-0 Vicryl.  The incisions were closed with 2-0 and 3-0 nylon.  Sterile dressings were applied after infiltrating the incisions with liposomal bupivacaine.  Short leg splint was applied.  The tourniquet had been released at 20 minutes, and hemostasis was achieved prior to closure.  The patient was then awakened from anesthesia and transported to the recovery room in stable condition.   FOLLOW UP PLAN: Nonweightbearing on the left lower extremity.  Observation overnight for pain control and for physical therapy on postop day 1.  He will continue to Plavix postoperatively.  Follow-up in the office in 2 weeks for suture removal and conversion to a short leg cast.   RADIOGRAPHS: AP, mortise and lateral radiographs of the left ankle are obtained intraoperatively.  These show interval reduction of the trimalleolar ankle fracture.  Hardware is appropriately positioned and of the appropriate lengths.  No other acute injuries are noted.  Extensive calcification of the peripheral vessels are noted.    Alfredo Martinez PA-C was present and scrubbed for the duration of the operative case. His assistance was essential in positioning the patient, prepping and draping, gaining and maintaining exposure, performing the operation, closing and dressing the wounds and applying the splint.

## 2021-03-21 NOTE — Progress Notes (Addendum)
Orthopedic Tech Progress Note Patient Details:  Scott Howard [birth year] [id number]  Level 2 trauma, reduction of ANKLE   Ortho Devices Type of Ortho Device: Stirrup splint, Short leg splint, Cotton web roll Ortho Device/Splint Location: LLE Ortho Device/Splint Interventions: Ordered, Application   Post Interventions Patient Tolerated: Well Instructions Provided: Care of device  [name] 03/21/2021, 12:05 PM

## 2021-03-21 NOTE — ED Notes (Signed)
[facility] PA at bedside.

## 2021-03-21 NOTE — Procedures (Signed)
Procedure: Left ankle closed reduction   Indication: Left ankle fracture   Surgeon: Silvestre Gunner, PA-C   Assist: None   Anesthesia: Propofol via EDP   EBL: None   Complications: None   Findings: After risks/benefits explained patient desires to undergo procedure. Consent obtained and time out performed. Sedation given and verified. Ankle reduced and splinted. Pt tolerated the procedure well. X-rays pending.       Lisette Abu, PA-C Orthopedic Surgery [phone]

## 2021-03-21 NOTE — ED Notes (Signed)
Consent at bedside.  

## 2021-03-21 NOTE — ED Provider Notes (Signed)
[facility] EMERGENCY DEPARTMENT Provider Note   CSN: [id number] Arrival date & time: 03/21/21  1045     History  Chief Complaint  Patient presents with   Level 2/ fall/ankle    Scott Howard is a 61 y.o. male.  Patient is a 61 year old male with history of hypertension, high cholesterol, diabetes, peripheral arterial disease/peripheral vascular disease who presents to the ED. patient fell back from ladder of about 6 or 8 feet.  Deformity to the left ankle.  He is on Plavix.  Level 2 trauma upon arrival.  Did not think he hit his head or lost consciousness.  Pain mostly to the left ankle and left hip.  Denies any nausea, vomiting, abdominal pain.  Having some lower back pain as well.  The history is provided by the patient.      Home Medications Prior to Admission medications   Medication Sig Start Date End Date Taking? Authorizing Provider  aspirin EC 81 MG EC tablet Take 1 tablet (81 mg total) by mouth daily. Swallow whole. 06/02/20  Yes Arnetha Courser, MD  clopidogrel (PLAVIX) 75 MG tablet Take 1 tablet (75 mg total) by mouth daily. 06/01/20  Yes Stegmayer, Cala Bradford A, PA-C  glipiZIDE (GLUCOTROL) 5 MG tablet Take 5 mg by mouth 2 (two) times daily. 02/23/20  Yes [provider]  HYDROcodone-acetaminophen (NORCO/VICODIN) 5-325 MG tablet Take 2 tablets by mouth every 4 (four) hours as needed for pain. 01/24/21  Yes [provider]  lisinopril (ZESTRIL) 10 MG tablet Take 2 tablets (20 mg total) by mouth daily. Patient taking differently: Take 10 mg by mouth daily. 08/16/20 03/21/21 Yes Dahal, Melina Schools, MD  Multiple Vitamin (MULTI-VITAMIN) tablet Take 1 tablet by mouth daily.   Yes [provider]  pravastatin (PRAVACHOL) 20 MG tablet Take 20 mg by mouth at bedtime. 03/23/20  Yes [provider]  tadalafil (CIALIS) 20 MG tablet Take 20 mg by mouth daily as needed for erectile dysfunction. 02/23/21  Yes [provider]       Allergies    Silvadene [silver sulfadiazine], Sulfa antibiotics, and Linezolid    Review of Systems   Review of Systems  Physical Exam Updated Vital Signs BP (!) 153/80    Pulse 77    Temp 98.1 F (36.7 C) (Oral)    Resp 16    Ht 5\' 6"  (1.676 m)    Wt 74.8 kg    SpO2 96%    BMI 26.63 kg/m  Physical Exam Vitals and nursing note reviewed.  Constitutional:      General: He is not in acute distress.    Appearance: He is well-developed. He is not ill-appearing.  HENT:     Head: Normocephalic and atraumatic.  Eyes:     Extraocular Movements: Extraocular movements intact.     Conjunctiva/sclera: Conjunctivae normal.     Pupils: Pupils are equal, round, and reactive to light.  Neck:     Comments: In cervical collar Cardiovascular:     Rate and Rhythm: Normal rate and regular rhythm.     Pulses: Normal pulses.     Heart sounds: No murmur heard.    Comments: Patient has DP pulses bilaterally with Doppler Pulmonary:     Effort: Pulmonary effort is normal. No respiratory distress.     Breath sounds: Normal breath sounds.  Abdominal:     Palpations: Abdomen is soft.     Tenderness: There is no abdominal tenderness.  Musculoskeletal:  General: Deformity present. No swelling.     Cervical back: Neck supple.     Comments: Deformity to left lower ankle with tenting of the skin along suspected fracture, some tenderness to the right wrist with overlying abrasion, no midline spinal tenderness  Skin:    General: Skin is warm and dry.     Capillary Refill: Capillary refill takes less than 2 seconds.  Neurological:     General: No focal deficit present.     Mental Status: He is alert and oriented to person, place, and time.     Cranial Nerves: No cranial nerve deficit.     Sensory: No sensory deficit.     Motor: No weakness.     Coordination: Coordination normal.     Comments: 5+ out of 5 strength throughout, normal sensation, no drift  Psychiatric:        Mood and Affect: Mood  normal.    ED Results / Procedures / Treatments   Labs (all labs ordered are listed, but only abnormal results are displayed) Labs Reviewed  COMPREHENSIVE METABOLIC PANEL - Abnormal; Notable for the following components:      Result Value   Glucose, Bld 121 (*)    BUN 30 (*)    Creatinine, Ser 1.25 (*)    Calcium 8.8 (*)    All other components within normal limits  I-STAT CHEM 8, ED - Abnormal; Notable for the following components:   BUN 32 (*)    Creatinine, Ser 1.30 (*)    Glucose, Bld 116 (*)    Calcium, Ion 1.11 (*)    All other components within normal limits  RESP PANEL BY RT-PCR (FLU A&B, COVID) ARPGX2  CBC  ETHANOL  LACTIC ACID, PLASMA  PROTIME-INR  URINALYSIS, ROUTINE W REFLEX MICROSCOPIC  SAMPLE TO BLOOD BANK    EKG None  Radiology DG Wrist Complete Right  Result Date: 03/21/2021 CLINICAL DATA:  Fall.  Laceration EXAM: RIGHT WRIST - COMPLETE 3+ VIEW COMPARISON:  None. FINDINGS: There is no evidence of fracture or dislocation. There is no evidence of arthropathy or other focal bone abnormality. Soft tissues are unremarkable. Mild arterial calcification. IMPRESSION: Negative. Electronically Signed   By: Marlan Palauharles  Clark M.D.   On: 03/21/2021 11:52   DG Ankle 2 Views Left  Result Date: 03/21/2021 CLINICAL DATA:  Post reduction EXAM: LEFT ANKLE - 2 VIEW COMPARISON:  03/21/2021 FINDINGS: Interval reduction and casting. Significant improvement in alignment with continued lateral displacement of the talus. Fracture of the medial malleolus with displacement. Fracture distal fibula with improved alignment. IMPRESSION: Post reduction and casting. Improved alignment with persistent lateral displacement of the talus. Electronically Signed   By: Marlan Palauharles  Clark M.D.   On: 03/21/2021 11:54   CT Head Wo Contrast  Result Date: 03/21/2021 CLINICAL DATA:  Fall.  Head trauma, on blood thinner EXAM: CT HEAD WITHOUT CONTRAST CT CERVICAL SPINE WITHOUT CONTRAST TECHNIQUE: Multidetector CT  imaging of the head and cervical spine was performed following the standard protocol without intravenous contrast. Multiplanar CT image reconstructions of the cervical spine were also generated. RADIATION DOSE REDUCTION: This exam was performed according to the departmental dose-optimization program which includes automated exposure control, adjustment of the mA and/or kV according to patient size and/or use of iterative reconstruction technique. COMPARISON:  None. FINDINGS: CT HEAD FINDINGS Brain: No evidence of acute infarction, hemorrhage, hydrocephalus, extra-axial collection or mass lesion/mass effect. Mild white matter hypodensity consistent with chronic microvascular ischemia. Small lipoma proximally 1 cm left suprasellar cistern  with several small adjacent lipomas. Vascular: Negative for hyperdense vessel Skull: Negative Sinuses/Orbits: Sinus clear.  Bilateral cataract extraction Other: None CT CERVICAL SPINE FINDINGS Alignment: Normal Skull base and vertebrae: Negative for fracture or mass Soft tissues and spinal canal: Negative for paraspinous mass or adenopathy. Atherosclerotic calcification Disc levels: Mild spinal stenosis C3-4 and C4-5 due to disc protrusions and spurring. Mild spinal stenosis C5-6 and C6-7 due to spurring. Upper chest: Lung apices clear bilaterally Other: None IMPRESSION: 1. No acute intracranial abnormality 2. Negative for cervical spine fracture. Electronically Signed   By: Marlan Palauharles  Clark M.D.   On: 03/21/2021 12:44   CT Cervical Spine Wo Contrast  Result Date: 03/21/2021 CLINICAL DATA:  Fall.  Head trauma, on blood thinner EXAM: CT HEAD WITHOUT CONTRAST CT CERVICAL SPINE WITHOUT CONTRAST TECHNIQUE: Multidetector CT imaging of the head and cervical spine was performed following the standard protocol without intravenous contrast. Multiplanar CT image reconstructions of the cervical spine were also generated. RADIATION DOSE REDUCTION: This exam was performed according to the  departmental dose-optimization program which includes automated exposure control, adjustment of the mA and/or kV according to patient size and/or use of iterative reconstruction technique. COMPARISON:  None. FINDINGS: CT HEAD FINDINGS Brain: No evidence of acute infarction, hemorrhage, hydrocephalus, extra-axial collection or mass lesion/mass effect. Mild white matter hypodensity consistent with chronic microvascular ischemia. Small lipoma proximally 1 cm left suprasellar cistern with several small adjacent lipomas. Vascular: Negative for hyperdense vessel Skull: Negative Sinuses/Orbits: Sinus clear.  Bilateral cataract extraction Other: None CT CERVICAL SPINE FINDINGS Alignment: Normal Skull base and vertebrae: Negative for fracture or mass Soft tissues and spinal canal: Negative for paraspinous mass or adenopathy. Atherosclerotic calcification Disc levels: Mild spinal stenosis C3-4 and C4-5 due to disc protrusions and spurring. Mild spinal stenosis C5-6 and C6-7 due to spurring. Upper chest: Lung apices clear bilaterally Other: None IMPRESSION: 1. No acute intracranial abnormality 2. Negative for cervical spine fracture. Electronically Signed   By: Marlan Palauharles  Clark M.D.   On: 03/21/2021 12:44   DG Pelvis Portable  Result Date: 03/21/2021 CLINICAL DATA:  Fall from ladder EXAM: PORTABLE PELVIS 1-2 VIEWS COMPARISON:  None. FINDINGS: There is no evidence of pelvic fracture or diastasis. No pelvic bone lesions are seen. Mild degenerative change in both hips. IMPRESSION: Negative. Electronically Signed   By: Marlan Palauharles  Clark M.D.   On: 03/21/2021 11:22   CT ANKLE LEFT WO CONTRAST  Result Date: 03/21/2021 CLINICAL DATA:  Ankle trauma, fracture, xray done (Age >= 5y) EXAM: CT OF THE LEFT ANKLE WITHOUT CONTRAST TECHNIQUE: Multidetector CT imaging of the left ankle was performed according to the standard protocol. Multiplanar CT image reconstructions were also generated. RADIATION DOSE REDUCTION: This exam was performed  according to the departmental dose-optimization program which includes automated exposure control, adjustment of the mA and/or kV according to patient size and/or use of iterative reconstruction technique. COMPARISON:  Same day radiograph FINDINGS: Bones/Joint/Cartilage Trimalleolar fracture subluxation of the left ankle. Obliquely oriented, comminuted distal fibular fracture at the level of the syndesmosis with angulation and up to 5 mm displacement. There is a medial malleolar fracture with up to 6 mm displacement. There is a posterior malleolar fracture with up to 3 mm posterior displacement an which involves approximately 15% of the articular surface. There is an anterolateral distal tibial fracture with 5 mm displacement consistent with an anterior tibiofibular ligament avulsion. There is lateral subluxation of the talus with respect to the tibia. Ligaments Suboptimally assessed by CT. Muscles and Tendons  No evidence of tendon entrapment. The posterior tibial tendon and flexor digitorum longus tendon abuts the medial margin of the posterior malleolar fracture. Soft tissues Extensive ankle soft tissue swelling. IMPRESSION: Trimalleolar fracture subluxation of the left ankle as described above. Of note the posterior malleolar fracture involves approximately 15% of the articular surface. Additional displaced anterolateral distal tibial fracture consistent with anterior tibiofibular ligament avulsion. No evidence of tendon entrapment, though the posterior tibial and flexor digitorum longus tendons abut the medial margin of the posterior malleolar fracture. Electronically Signed   By: Caprice Renshaw M.D.   On: 03/21/2021 12:47   CT CHEST ABDOMEN PELVIS W CONTRAST  Result Date: 03/21/2021 CLINICAL DATA:  Trauma, fall EXAM: CT CHEST, ABDOMEN, AND PELVIS WITH CONTRAST TECHNIQUE: Multidetector CT imaging of the chest, abdomen and pelvis was performed following the standard protocol during bolus administration of  intravenous contrast. RADIATION DOSE REDUCTION: This exam was performed according to the departmental dose-optimization program which includes automated exposure control, adjustment of the mA and/or kV according to patient size and/or use of iterative reconstruction technique. CONTRAST:  OMNIPAQUE IOHEXOL 300 MG/ML  SOLN COMPARISON:  CT abdomen done on 05/31/2020 FINDINGS: CT CHEST FINDINGS Cardiovascular: Fairly extensive coronary artery calcifications are seen. There is homogeneous enhancement in thoracic aorta. There are scattered calcifications and atherosclerotic plaques in the aorta and its major branches. Central pulmonary arteries branches are unremarkable. Mediastinum/Nodes: There is no evidence of mediastinal hematoma. Lungs/Pleura: There is no focal pulmonary consolidation. There is no pleural effusion or pneumothorax. Musculoskeletal: No displaced fractures are seen. CT ABDOMEN PELVIS FINDINGS Hepatobiliary: No focal abnormality is seen. Beam hardening artifacts limit evaluation. Gallbladder is unremarkable. Pancreas: No focal abnormality is seen. Spleen: Unremarkable. Adrenals/Urinary Tract: Adrenals are not enlarged. There is no hydronephrosis. There is no demonstrable cortical laceration. There is no perinephric fluid collection. There is 2.3 cm smooth marginated mixed density lesion with coarse calcifications in the anteromedial aspect of upper pole of right kidney. This lesion measured 1.9 cm in maximum diameter in the previous study. There are no renal or ureteral stones. Urinary bladder is unremarkable. Stomach/Bowel: Stomach is unremarkable. Small bowel loops are not dilated. Appendix is not dilated. There is no focal wall thickening in the colon. Few diverticula are seen without signs of focal diverticulitis. There is no pericolic stranding or fluid collection. Vascular/Lymphatic: Atherosclerotic plaques and calcifications are seen in the aorta and its major branches. Reproductive:  Unremarkable. Other: There is no ascites or pneumoperitoneum. Left inguinal hernia containing fat is seen. Small umbilical hernia containing fat is seen. Musculoskeletal: No recent fracture is seen. IMPRESSION: There is no demonstrable acute injury in chest, abdomen and pelvis. There is no laceration in the solid organs. There are no displaced fractures. There is 2.3 cm smooth marginated mixed density lesion with coarse internal calcifications in the upper pole of right kidney which has increased in size since 05/31/2020. Possibility of neoplastic process is not excluded. Follow-up MRI may be considered. Coronary artery calcifications are seen. Other findings as described in the body of the report. Electronically Signed   By: Ernie Avena M.D.   On: 03/21/2021 12:54   CT T-SPINE NO CHARGE  Result Date: 03/21/2021 CLINICAL DATA:  Trauma EXAM: CT Thoracic and Lumbar spine without contrast TECHNIQUE: Multiplanar CT images of the thoracic and lumbar spine were reconstructed from contemporary CT of the Chest, Abdomen, and Pelvis. RADIATION DOSE REDUCTION: This exam was performed according to the departmental dose-optimization program which includes automated exposure control, adjustment  of the mA and/or kV according to patient size and/or use of iterative reconstruction technique. CONTRAST:  No addition COMPARISON:  CT 05/31/2020 FINDINGS: CT THORACIC SPINE FINDINGS Alignment: Normal. Vertebrae: No acute fracture or focal pathologic process. Paraspinal and other soft tissues: Negative. Disc levels: Mild multilevel degenerative changes. No visible impingement. CT LUMBAR SPINE FINDINGS Segmentation: 5 lumbar type vertebrae. Alignment: Normal. Vertebrae: There is a questionable nondisplaced fracture lateral aspect of the left L3 transverse process. There is no other evidence of acute fracture in the lumbar spine. Paraspinal and other soft tissues: Heterogeneous right anterior renal lesion with calcifications.  Disc levels: Preserved disc heights.  No visible impingement. IMPRESSION: Questionable nondisplaced left L3 transverse process fracture, correlate with point tenderness. No other evidence of acute fracture in the lumbar spine. No acute thoracic spine fracture. Heterogeneous right anterior renal mass with calcifications, which should be further evaluated with a multiphase contrast enhanced MRI or CT. Electronically Signed   By: Caprice Renshaw M.D.   On: 03/21/2021 13:00   CT L-SPINE NO CHARGE  Result Date: 03/21/2021 CLINICAL DATA:  Trauma EXAM: CT Thoracic and Lumbar spine without contrast TECHNIQUE: Multiplanar CT images of the thoracic and lumbar spine were reconstructed from contemporary CT of the Chest, Abdomen, and Pelvis. RADIATION DOSE REDUCTION: This exam was performed according to the departmental dose-optimization program which includes automated exposure control, adjustment of the mA and/or kV according to patient size and/or use of iterative reconstruction technique. CONTRAST:  No addition COMPARISON:  CT 05/31/2020 FINDINGS: CT THORACIC SPINE FINDINGS Alignment: Normal. Vertebrae: No acute fracture or focal pathologic process. Paraspinal and other soft tissues: Negative. Disc levels: Mild multilevel degenerative changes. No visible impingement. CT LUMBAR SPINE FINDINGS Segmentation: 5 lumbar type vertebrae. Alignment: Normal. Vertebrae: There is a questionable nondisplaced fracture lateral aspect of the left L3 transverse process. There is no other evidence of acute fracture in the lumbar spine. Paraspinal and other soft tissues: Heterogeneous right anterior renal lesion with calcifications. Disc levels: Preserved disc heights.  No visible impingement. IMPRESSION: Questionable nondisplaced left L3 transverse process fracture, correlate with point tenderness. No other evidence of acute fracture in the lumbar spine. No acute thoracic spine fracture. Heterogeneous right anterior renal mass with  calcifications, which should be further evaluated with a multiphase contrast enhanced MRI or CT. Electronically Signed   By: Caprice Renshaw M.D.   On: 03/21/2021 13:00   DG Chest Port 1 View  Result Date: 03/21/2021 CLINICAL DATA:  Fall from ladder EXAM: PORTABLE CHEST 1 VIEW COMPARISON:  06/13/2020 FINDINGS: The heart size and mediastinal contours are within normal limits. Both lungs are clear. The visualized skeletal structures are unremarkable. IMPRESSION: No active disease. Electronically Signed   By: Marlan Palau M.D.   On: 03/21/2021 11:21   DG Ankle Left Port  Result Date: 03/21/2021 CLINICAL DATA:  Fall from ladder EXAM: PORTABLE LEFT ANKLE - 2 VIEW COMPARISON:  None. FINDINGS: Fracture dislocation of the ankle. Comminuted and angulated and displaced fracture distal fibula. Fracture of the medial malleolus. One shaft width of lateral displacement of the talus relative to the tibia. 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 Electronically Signed   By: Marlan Palau M.D.   On: 03/21/2021 11:23    Procedures .Sedation  Date/Time: 03/21/2021 11:26 AM Performed by: Virgina Norfolk, DO Authorized by: Virgina Norfolk, DO   Consent:    Consent obtained:  Verbal and written   Consent given by:  Patient   Risks discussed:  Dysrhythmia, inadequate sedation, nausea, allergic reaction, prolonged hypoxia resulting in organ damage, prolonged sedation necessitating reversal, vomiting and respiratory compromise necessitating ventilatory assistance and intubation   Alternatives discussed:  Analgesia without sedation Universal protocol:    Procedure explained and questions answered to patient or proxy's satisfaction: yes     Relevant documents present and verified: yes     Test results available: yes     Imaging studies available: yes     Required blood products, implants, devices, and special equipment available: yes      Immediately prior to procedure, a time out was called: yes     Patient identity confirmed:  Arm band and verbally with patient Indications:    Procedure performed:  Fracture reduction   Procedure necessitating sedation performed by:  Different physician Pre-sedation assessment:    Time since last food or drink:  5 hours   ASA classification: class 2 - patient with mild systemic disease     Mouth opening:  3 or more finger widths   Thyromental distance:  4 finger widths   Mallampati score:  I - soft palate, uvula, fauces, pillars visible   Neck mobility: normal     Pre-sedation assessments completed and reviewed: airway patency, cardiovascular function, hydration status, mental status, nausea/vomiting, pain level, respiratory function and temperature     Pre-sedation assessment completed:  03/21/2021 11:00 AM Immediate pre-procedure details:    Reassessment: Patient reassessed immediately prior to procedure     Reviewed: vital signs     Verified: bag valve mask available, emergency equipment available, intubation equipment available, IV patency confirmed, oxygen available and reversal medications available   Procedure details (see MAR for exact dosages):    Preoxygenation:  Nasal cannula   Sedation:  Propofol   Intended level of sedation: deep and moderate (conscious sedation)   Intra-procedure monitoring:  Blood pressure monitoring, cardiac monitor, continuous pulse oximetry, continuous capnometry, frequent LOC assessments and frequent vital sign checks   Intra-procedure events: respiratory depression     Intra-procedure management:  Airway repositioning   Total Provider sedation time (minutes):  20 Post-procedure details:    Post-sedation assessment completed:  03/21/2021 11:48 AM   Attendance: Constant attendance by certified staff until patient recovered     Recovery: Patient returned to pre-procedure baseline     Post-sedation assessments completed and reviewed: airway patency,  cardiovascular function, hydration status, mental status, nausea/vomiting, pain level, respiratory function and temperature     Patient is stable for discharge or admission: yes     Procedure completion:  Tolerated well, no immediate complications    Medications Ordered in ED Medications  enoxaparin (LOVENOX) injection 40 mg (has no administration in time range)  methocarbamol (ROBAXIN) tablet 500 mg (has no administration in time range)    Or  methocarbamol (ROBAXIN) 500 mg in dextrose 5 % 50 mL IVPB (has no administration in time range)  ondansetron (ZOFRAN) tablet 4 mg (has no administration in time range)    Or  ondansetron (ZOFRAN) injection 4 mg (has no administration in time range)  oxyCODONE (Oxy IR/ROXICODONE) immediate release tablet 5-15 mg (has no administration in time range)  morphine (PF) 2 MG/ML injection 2 mg (has no administration in time range)  propofol (DIPRIVAN) 10 mg/mL bolus/IV push (60 mg  Given 03/21/21 1122)  ondansetron (ZOFRAN) injection 4 mg (4 mg Intravenous Given 03/21/21 1113)  sodium chloride 0.9 % bolus 1,000 mL (1,000 mLs Intravenous New Bag/Given 03/21/21 1114)  iohexol (  OMNIPAQUE) 300 MG/ML solution 100 mL (100 mLs Intravenous Contrast Given 03/21/21 1235)    ED Course/ Medical Decision Making/ A&P                           Medical Decision Making Amount and/or Complexity of Data Reviewed Labs: ordered. Radiology: ordered.  Risk Prescription drug management. Decision regarding hospitalization.   Scott Howard is here as a level 2 trauma after fall while on Plavix.  Obvious left ankle deformity.  Normal vitals.  No fever.  Did not think he hit his head or loss consciousness.  He has significant left ankle deformity.  I am able to Doppler pulse in his left lower extremity.  Skin is severely tented but there is no breakdown of the skin.  Orthopedics at the bedside upon arrival of the patient.  Clinically he appears well.  He has no midline spinal  pain.  Due to the significant deformity patient was sedated with propofol and orthopedics reduced the fracture at the bedside.  Trauma images were ordered including trauma labs.  Patient given IV fentanyl with EMS prior to arrival.  Has been pain-free since sedation and reduction.  Neurovascularly intact before and after procedure.  Per my review of lab work including CBC and CMP there is no significant anemia or electrolyte abnormality.  Per review of his ankle x-rays he does have trimalleolar fracture of the left ankle.  This was successfully reduced however there is still significant displacement of the posterior fragment.  Earney Hamburg with orthopedics states that they will take the patient to the OR for fixation.  Patient to be admitted to the orthopedic service.  Hemodynamically stable throughout my care.  Trauma images otherwise per radiology review are unremarkable.  I reviewed and interpreted all x-rays.        Final Clinical Impression(s) / ED Diagnoses Final diagnoses:  Trauma  Fall  Closed trimalleolar fracture of left ankle, initial encounter    Rx / DC Orders ED Discharge Orders     None         Virgina Norfolk, DO 03/21/21 1326

## 2021-03-21 NOTE — Discharge Instructions (Addendum)
Toni Arthurs, MD [facility]  Please read the following information regarding your care after surgery.  Medications  You only need a prescription for the narcotic pain medicine (ex. oxycodone, Percocet, Norco).  All of the other medicines listed below are available over the counter. X Aleve 2 pills twice a day for the first 3 days after surgery. X acetominophen (Tylenol) 650 mg every 4-6 hours as you need for minor to moderate pain X oxycodone as prescribed for severe pain  Narcotic pain medicine (ex. oxycodone, Percocet, Vicodin) will cause constipation.  To prevent this problem, take the following medicines while you are taking any pain medicine. X docusate sodium (Colace) 100 mg twice a day X senna (Senokot) 2 tablets twice a day  X To help prevent blood clots resume your plavix.  Weight Bearing X Do not bear any weight on the operated leg or foot.  Cast / Splint / Dressing X Keep your splint, cast or dressing clean and dry.  Dont put anything (coat hanger, pencil, etc) down inside of it.  If it gets damp, use a hair dryer on the cool setting to dry it.  If it gets soaked, call the office to schedule an appointment for a cast change.   After your dressing, cast or splint is removed; you may shower, but do not soak or scrub the wound.  Allow the water to run over it, and then gently pat it dry.  Swelling It is normal for you to have swelling where you had surgery.  To reduce swelling and pain, keep your toes above your nose for at least 3 days after surgery.  It may be necessary to keep your foot or leg elevated for several weeks.  If it hurts, it should be elevated.  Follow Up Call my office at [phone] when you are discharged from the hospital or surgery center to schedule an appointment to be seen two weeks after surgery.  Call my office at [phone] if you develop a fever >101.5 F, nausea, vomiting, bleeding from the surgical site or severe pain.

## 2021-03-21 NOTE — Sedation Documentation (Signed)
Pt breathing freely on his own. Oxygen increased to 5 lpm via [place].

## 2021-03-21 NOTE — ED Triage Notes (Signed)
Pt arrives via [facility] for level two fall on thinners. Pt was in his attic and coming down when he fell. Pt has obvious L ankle deformity. EMS unable to find pulses in his foot. Pt A+Ox4. No LOC by EMS.   #18 L AC by EMS.

## 2021-03-21 NOTE — H&P (Addendum)
Scott Howard is an 61 y.o. male.   Chief Complaint: L ankle pain HPI: 61 y/o male with PMH of PVD and diabetes fell off his attic stairs this morning about 9 am.  He landed on his left foot and then on his buttocks.  He c/o some soreness at the right  wrist and more severe pain at the left ankle.  He has sprained the left ankle in the remote past but denies any fractures.  He is not a smoker.  He works as an Pharmacist, hospital.  His ankle hurts worse with motion and feels better with rest.  He denies any numbness in the foot or ankle.  He last ate early this morning.  Last had plavix yesterday.  Past Medical History:  Diagnosis Date   COVID-19    05/2020   Diabetes mellitus without complication ([place])    Heart murmur    Mild   Hyperlipidemia    borderline   Hypertension    Peripheral vascular disease [facility])     Past Surgical History:  Procedure Laterality Date   AMPUTATION Right 08/11/2020   Procedure: Right 1st and 5th Ray Resection;  Surgeon: [name], DPM;  Location: [facility];  Service: Podiatry;  Laterality: Right;   AMPUTATION TOE Left 05/29/2020   Procedure: 4th and 5th TOE AMPUTATION WITH PARTIAL RAY RESECTION;  Surgeon: [name], DPM;  Location: [facility];  Service: Podiatry;  Laterality: Left;   AMPUTATION TOE Right 01/27/2021   Procedure: AMPUTATION TOE - 2ND MPJ;  Surgeon: [name], DPM;  Location: [facility];  Service: Podiatry;  Laterality: Right;   CATARACT EXTRACTION W/PHACO Right 09/19/2020   Procedure: CATARACT EXTRACTION PHACO AND INTRAOCULAR LENS PLACEMENT ([place]) RIGHT INTRVITREAL TRIESCENCE DIABETIC;  Surgeon: Eulogio Bear, MD;  Location: [facility];  Service: Ophthalmology;  Laterality: Right;  Diabetic - oral meds 4.55 00:36.1   CATARACT EXTRACTION W/PHACO Left 10/10/2020   Procedure: CATARACT EXTRACTION PHACO AND INTRAOCULAR LENS PLACEMENT ([facility]) LEFT INTRAVITREAL TRIESCENCE DIABETIC 4.86 00:38.7;  Surgeon: Eulogio Bear, MD;  Location:  [place];  Service: Ophthalmology;  Laterality: Left;  Diabetic - oral meds   HAND SURGERY Left    LOWER EXTREMITY ANGIOGRAPHY Left 05/27/2020   Procedure: Lower Extremity Angiography;  Surgeon: Katha Cabal, MD;  Location: [place] [facility];  Service: Cardiovascular;  Laterality: Left;   LOWER EXTREMITY ANGIOGRAPHY Right 05/31/2020   Procedure: Lower Extremity Angiography;  Surgeon: Katha Cabal, MD;  Location: [place] [facility];  Service: Cardiovascular;  Laterality: Right;   LOWER EXTREMITY ANGIOGRAPHY Right 08/10/2020   Procedure: Lower Extremity Angiography;  Surgeon: Katha Cabal, MD;  Location: [place] [facility];  Service: Cardiovascular;  Laterality: Right;   TONSILLECTOMY AND ADENOIDECTOMY      Family History  Problem Relation Age of Onset   Cancer Mother    Cancer Father    Hypertension Brother    Congestive Heart Failure Brother    Social History:  reports that he has never smoked. He has never used smokeless tobacco. He reports current alcohol use. He reports that he does not use drugs.  Allergies:  Allergies  Allergen Reactions   Silvadene [Silver Sulfadiazine] Rash   Sulfa Antibiotics Rash   Linezolid Rash    Medications Prior to Admission  Medication Sig Dispense Refill   aspirin EC 81 MG EC tablet Take 1 tablet (81 mg total) by mouth daily. Swallow whole. 30 tablet 11   clopidogrel (PLAVIX) 75 MG  tablet Take 1 tablet (75 mg total) by mouth daily. 90 tablet 3   glipiZIDE (GLUCOTROL) 5 MG tablet Take 5 mg by mouth 2 (two) times daily.     HYDROcodone-acetaminophen (NORCO/VICODIN) 5-325 MG tablet Take 2 tablets by mouth every 4 (four) hours as needed for pain.     lisinopril (ZESTRIL) 10 MG tablet Take 2 tablets (20 mg total) by mouth daily. (Patient taking differently: Take 10 mg by mouth daily.) 60 tablet 0   Multiple Vitamin (MULTI-VITAMIN) tablet Take 1 tablet by mouth daily.     pravastatin (PRAVACHOL) 20 MG tablet Take 20 mg by  mouth at bedtime.     tadalafil (CIALIS) 20 MG tablet Take 20 mg by mouth daily as needed for erectile dysfunction.      Results for orders placed or performed during the hospital encounter of 03/21/21 (from the past 48 hour(s))  Sample to Blood Bank     Status: None   Collection Time: 03/21/21 10:55 AM  Result Value Ref Range   Blood Bank Specimen SAMPLE AVAILABLE FOR TESTING    Sample Expiration      03/22/2021,2359 Performed at [facility], [address] [place], [place] [address]   I-Stat Chem 8, ED     Status: Abnormal   Collection Time: 03/21/21 11:05 AM  Result Value Ref Range   Sodium 138 135 - 145 mmol/L   Potassium 4.8 3.5 - 5.1 mmol/L   Chloride 105 98 - 111 mmol/L   BUN 32 (H) 8 - 23 mg/dL   Creatinine, Ser 6.04 (H) 0.61 - 1.24 mg/dL   Glucose, Bld 540 (H) 70 - 99 mg/dL    Comment: Glucose reference range applies only to samples taken after fasting for at least 8 hours.   Calcium, Ion 1.11 (L) 1.15 - 1.40 mmol/L   TCO2 25 22 - 32 mmol/L   Hemoglobin 14.6 13.0 - 17.0 g/dL   HCT 98.1 19.1 - 47.8 %  Comprehensive metabolic panel     Status: Abnormal   Collection Time: 03/21/21 11:09 AM  Result Value Ref Range   Sodium 138 135 - 145 mmol/L   Potassium 4.9 3.5 - 5.1 mmol/L   Chloride 105 98 - 111 mmol/L   CO2 26 22 - 32 mmol/L   Glucose, Bld 121 (H) 70 - 99 mg/dL    Comment: Glucose reference range applies only to samples taken after fasting for at least 8 hours.   BUN 30 (H) 8 - 23 mg/dL   Creatinine, Ser 2.95 (H) 0.61 - 1.24 mg/dL   Calcium 8.8 (L) 8.9 - 10.3 mg/dL   Total Protein 7.0 6.5 - 8.1 g/dL   Albumin 3.8 3.5 - 5.0 g/dL   AST 19 15 - 41 U/L   ALT 18 0 - 44 U/L   Alkaline Phosphatase 53 38 - 126 U/L   Total Bilirubin 1.1 0.3 - 1.2 mg/dL   GFR, Estimated >62 >13 mL/min    Comment: (NOTE) Calculated using the CKD-EPI Creatinine Equation (2021)    Anion gap 7 5 - 15    Comment: Performed at [facility], [address], [place],  [place] [address]  CBC     Status: None   Collection Time: 03/21/21 11:09 AM  Result Value Ref Range   WBC 7.7 4.0 - 10.5 K/uL   RBC 4.78 4.22 - 5.81 MIL/uL   Hemoglobin 14.5 13.0 - 17.0 g/dL   HCT 84.6 96.2 - 95.2 %  MCV 92.3 80.0 - 100.0 fL   MCH 30.3 26.0 - 34.0 pg   MCHC 32.9 30.0 - 36.0 g/dL   RDW 72.512.1 36.611.5 - 44.015.5 %   Platelets 172 150 - 400 K/uL   nRBC 0.0 0.0 - 0.2 %    Comment: Performed at [facility], [address] [place], [place] [address]  Protime-INR     Status: None   Collection Time: 03/21/21 11:09 AM  Result Value Ref Range   Prothrombin Time 12.8 11.4 - 15.2 seconds   INR 1.0 0.8 - 1.2    Comment: (NOTE) INR goal varies based on device and disease states. Performed at [facility], [address] [place], [place] [address]   Ethanol     Status: None   Collection Time: 03/21/21 11:10 AM  Result Value Ref Range   Alcohol, Ethyl (B) <10 <10 mg/dL    Comment: (NOTE) Lowest detectable limit for serum alcohol is 10 mg/dL.  For medical purposes only. Performed at [facility], [address] [place], [place] [address]   Lactic acid, plasma     Status: None   Collection Time: 03/21/21 11:16 AM  Result Value Ref Range   Lactic Acid, Venous 0.9 0.5 - 1.9 mmol/L    Comment: Performed at [facility], [address] [place], [place] [address]   DG Wrist Complete Right  Result Date: 03/21/2021 CLINICAL DATA:  Fall.  Laceration EXAM: RIGHT WRIST - COMPLETE 3+ VIEW COMPARISON:  None. FINDINGS: There is no evidence of fracture or dislocation. There is no evidence of arthropathy or other focal bone abnormality. Soft tissues are unremarkable. Mild arterial calcification. IMPRESSION: Negative. Electronically Signed   By: Marlan Palauharles  Clark M.D.   On: 03/21/2021 11:52   DG Ankle 2 Views Left  Result Date: 03/21/2021 CLINICAL DATA:  Post reduction EXAM: LEFT ANKLE - 2 VIEW COMPARISON:  03/21/2021 FINDINGS: Interval reduction and casting. Significant  improvement in alignment with continued lateral displacement of the talus. Fracture of the medial malleolus with displacement. Fracture distal fibula with improved alignment. IMPRESSION: Post reduction and casting. Improved alignment with persistent lateral displacement of the talus. Electronically Signed   By: Marlan Palauharles  Clark M.D.   On: 03/21/2021 11:54   CT Head Wo Contrast  Result Date: 03/21/2021 CLINICAL DATA:  Fall.  Head trauma, on blood thinner EXAM: CT HEAD WITHOUT CONTRAST CT CERVICAL SPINE WITHOUT CONTRAST TECHNIQUE: Multidetector CT imaging of the head and cervical spine was performed following the standard protocol without intravenous contrast. Multiplanar CT image reconstructions of the cervical spine were also generated. RADIATION DOSE REDUCTION: This exam was performed according to the departmental dose-optimization program which includes automated exposure control, adjustment of the mA and/or kV according to patient size and/or use of iterative reconstruction technique. COMPARISON:  None. FINDINGS: CT HEAD FINDINGS Brain: No evidence of acute infarction, hemorrhage, hydrocephalus, extra-axial collection or mass lesion/mass effect. Mild white matter hypodensity consistent with chronic microvascular ischemia. Small lipoma proximally 1 cm left suprasellar cistern with several small adjacent lipomas. Vascular: Negative for hyperdense vessel Skull: Negative Sinuses/Orbits: Sinus clear.  Bilateral cataract extraction Other: None CT CERVICAL SPINE FINDINGS Alignment: Normal Skull base and vertebrae: Negative for fracture or mass Soft tissues and spinal canal: Negative for paraspinous mass or adenopathy. Atherosclerotic calcification Disc levels: Mild spinal stenosis C3-4 and C4-5 due to disc protrusions and spurring. Mild spinal stenosis C5-6 and C6-7 due to spurring. Upper chest: Lung apices clear bilaterally Other:  None IMPRESSION: 1. No acute intracranial abnormality 2. Negative for cervical spine  fracture. Electronically Signed   By: Franchot Gallo M.D.   On: 03/21/2021 12:44   CT Cervical Spine Wo Contrast  Result Date: 03/21/2021 CLINICAL DATA:  Fall.  Head trauma, on blood thinner EXAM: CT HEAD WITHOUT CONTRAST CT CERVICAL SPINE WITHOUT CONTRAST TECHNIQUE: Multidetector CT imaging of the head and cervical spine was performed following the standard protocol without intravenous contrast. Multiplanar CT image reconstructions of the cervical spine were also generated. RADIATION DOSE REDUCTION: This exam was performed according to the departmental dose-optimization program which includes automated exposure control, adjustment of the mA and/or kV according to patient size and/or use of iterative reconstruction technique. COMPARISON:  None. FINDINGS: CT HEAD FINDINGS Brain: No evidence of acute infarction, hemorrhage, hydrocephalus, extra-axial collection or mass lesion/mass effect. Mild white matter hypodensity consistent with chronic microvascular ischemia. Small lipoma proximally 1 cm left suprasellar cistern with several small adjacent lipomas. Vascular: Negative for hyperdense vessel Skull: Negative Sinuses/Orbits: Sinus clear.  Bilateral cataract extraction Other: None CT CERVICAL SPINE FINDINGS Alignment: Normal Skull base and vertebrae: Negative for fracture or mass Soft tissues and spinal canal: Negative for paraspinous mass or adenopathy. Atherosclerotic calcification Disc levels: Mild spinal stenosis C3-4 and C4-5 due to disc protrusions and spurring. Mild spinal stenosis C5-6 and C6-7 due to spurring. Upper chest: Lung apices clear bilaterally Other: None IMPRESSION: 1. No acute intracranial abnormality 2. Negative for cervical spine fracture. Electronically Signed   By: Franchot Gallo M.D.   On: 03/21/2021 12:44   DG Pelvis Portable  Result Date: 03/21/2021 CLINICAL DATA:  Fall from ladder EXAM: PORTABLE PELVIS 1-2 VIEWS COMPARISON:  None. FINDINGS: There is no evidence of pelvic fracture or  diastasis. No pelvic bone lesions are seen. Mild degenerative change in both hips. IMPRESSION: Negative. Electronically Signed   By: Franchot Gallo M.D.   On: 03/21/2021 11:22   CT ANKLE LEFT WO CONTRAST  Result Date: 03/21/2021 CLINICAL DATA:  Ankle trauma, fracture, xray done (Age >= 5y) EXAM: CT OF THE LEFT ANKLE WITHOUT CONTRAST TECHNIQUE: Multidetector CT imaging of the left ankle was performed according to the standard protocol. Multiplanar CT image reconstructions were also generated. RADIATION DOSE REDUCTION: This exam was performed according to the departmental dose-optimization program which includes automated exposure control, adjustment of the mA and/or kV according to patient size and/or use of iterative reconstruction technique. COMPARISON:  Same day radiograph FINDINGS: Bones/Joint/Cartilage Trimalleolar fracture subluxation of the left ankle. Obliquely oriented, comminuted distal fibular fracture at the level of the syndesmosis with angulation and up to 5 mm displacement. There is a medial malleolar fracture with up to 6 mm displacement. There is a posterior malleolar fracture with up to 3 mm posterior displacement an which involves approximately 15% of the articular surface. There is an anterolateral distal tibial fracture with 5 mm displacement consistent with an anterior tibiofibular ligament avulsion. There is lateral subluxation of the talus with respect to the tibia. Ligaments Suboptimally assessed by CT. Muscles and Tendons No evidence of tendon entrapment. The posterior tibial tendon and flexor digitorum longus tendon abuts the medial margin of the posterior malleolar fracture. Soft tissues Extensive ankle soft tissue swelling. IMPRESSION: Trimalleolar fracture subluxation of the left ankle as described above. Of note the posterior malleolar fracture involves approximately 15% of the articular surface. Additional displaced anterolateral distal tibial fracture consistent with anterior  tibiofibular ligament avulsion. No evidence of tendon entrapment, though the posterior tibial and flexor digitorum  longus tendons abut the medial margin of the posterior malleolar fracture. Electronically Signed   By: Maurine Simmering M.D.   On: 03/21/2021 12:47   CT CHEST ABDOMEN PELVIS W CONTRAST  Result Date: 03/21/2021 CLINICAL DATA:  Trauma, fall EXAM: CT CHEST, ABDOMEN, AND PELVIS WITH CONTRAST TECHNIQUE: Multidetector CT imaging of the chest, abdomen and pelvis was performed following the standard protocol during bolus administration of intravenous contrast. RADIATION DOSE REDUCTION: This exam was performed according to the departmental dose-optimization program which includes automated exposure control, adjustment of the mA and/or kV according to patient size and/or use of iterative reconstruction technique. CONTRAST:  161mL OMNIPAQUE IOHEXOL 300 MG/ML  SOLN COMPARISON:  CT abdomen done on 05/31/2020 FINDINGS: CT CHEST FINDINGS Cardiovascular: Fairly extensive coronary artery calcifications are seen. There is homogeneous enhancement in thoracic aorta. There are scattered calcifications and atherosclerotic plaques in the aorta and its major branches. Central pulmonary arteries branches are unremarkable. Mediastinum/Nodes: There is no evidence of mediastinal hematoma. Lungs/Pleura: There is no focal pulmonary consolidation. There is no pleural effusion or pneumothorax. Musculoskeletal: No displaced fractures are seen. CT ABDOMEN PELVIS FINDINGS Hepatobiliary: No focal abnormality is seen. Beam hardening artifacts limit evaluation. Gallbladder is unremarkable. Pancreas: No focal abnormality is seen. Spleen: Unremarkable. Adrenals/Urinary Tract: Adrenals are not enlarged. There is no hydronephrosis. There is no demonstrable cortical laceration. There is no perinephric fluid collection. There is 2.3 cm smooth marginated mixed density lesion with coarse calcifications in the anteromedial aspect of upper pole of  right kidney. This lesion measured 1.9 cm in maximum diameter in the previous study. There are no renal or ureteral stones. Urinary bladder is unremarkable. Stomach/Bowel: Stomach is unremarkable. Small bowel loops are not dilated. Appendix is not dilated. There is no focal wall thickening in the colon. Few diverticula are seen without signs of focal diverticulitis. There is no pericolic stranding or fluid collection. Vascular/Lymphatic: Atherosclerotic plaques and calcifications are seen in the aorta and its major branches. Reproductive: Unremarkable. Other: There is no ascites or pneumoperitoneum. Left inguinal hernia containing fat is seen. Small umbilical hernia containing fat is seen. Musculoskeletal: No recent fracture is seen. IMPRESSION: There is no demonstrable acute injury in chest, abdomen and pelvis. There is no laceration in the solid organs. There are no displaced fractures. There is 2.3 cm smooth marginated mixed density lesion with coarse internal calcifications in the upper pole of right kidney which has increased in size since 05/31/2020. Possibility of neoplastic process is not excluded. Follow-up MRI may be considered. Coronary artery calcifications are seen. Other findings as described in the body of the report. Electronically Signed   By: Elmer Picker M.D.   On: 03/21/2021 12:54   CT T-SPINE NO CHARGE  Result Date: 03/21/2021 CLINICAL DATA:  Trauma EXAM: CT Thoracic and Lumbar spine without contrast TECHNIQUE: Multiplanar CT images of the thoracic and lumbar spine were reconstructed from contemporary CT of the Chest, Abdomen, and Pelvis. RADIATION DOSE REDUCTION: This exam was performed according to the departmental dose-optimization program which includes automated exposure control, adjustment of the mA and/or kV according to patient size and/or use of iterative reconstruction technique. CONTRAST:  No addition COMPARISON:  CT 05/31/2020 FINDINGS: CT THORACIC SPINE FINDINGS  Alignment: Normal. Vertebrae: No acute fracture or focal pathologic process. Paraspinal and other soft tissues: Negative. Disc levels: Mild multilevel degenerative changes. No visible impingement. CT LUMBAR SPINE FINDINGS Segmentation: 5 lumbar type vertebrae. Alignment: Normal. Vertebrae: There is a questionable nondisplaced fracture lateral aspect of the left L3 transverse  process. There is no other evidence of acute fracture in the lumbar spine. Paraspinal and other soft tissues: Heterogeneous right anterior renal lesion with calcifications. Disc levels: Preserved disc heights.  No visible impingement. IMPRESSION: Questionable nondisplaced left L3 transverse process fracture, correlate with point tenderness. No other evidence of acute fracture in the lumbar spine. No acute thoracic spine fracture. Heterogeneous right anterior renal mass with calcifications, which should be further evaluated with a multiphase contrast enhanced MRI or CT. Electronically Signed   By: Maurine Simmering M.D.   On: 03/21/2021 13:00   CT L-SPINE NO CHARGE  Result Date: 03/21/2021 CLINICAL DATA:  Trauma EXAM: CT Thoracic and Lumbar spine without contrast TECHNIQUE: Multiplanar CT images of the thoracic and lumbar spine were reconstructed from contemporary CT of the Chest, Abdomen, and Pelvis. RADIATION DOSE REDUCTION: This exam was performed according to the departmental dose-optimization program which includes automated exposure control, adjustment of the mA and/or kV according to patient size and/or use of iterative reconstruction technique. CONTRAST:  No addition COMPARISON:  CT 05/31/2020 FINDINGS: CT THORACIC SPINE FINDINGS Alignment: Normal. Vertebrae: No acute fracture or focal pathologic process. Paraspinal and other soft tissues: Negative. Disc levels: Mild multilevel degenerative changes. No visible impingement. CT LUMBAR SPINE FINDINGS Segmentation: 5 lumbar type vertebrae. Alignment: Normal. Vertebrae: There is a questionable  nondisplaced fracture lateral aspect of the left L3 transverse process. There is no other evidence of acute fracture in the lumbar spine. Paraspinal and other soft tissues: Heterogeneous right anterior renal lesion with calcifications. Disc levels: Preserved disc heights.  No visible impingement. IMPRESSION: Questionable nondisplaced left L3 transverse process fracture, correlate with point tenderness. No other evidence of acute fracture in the lumbar spine. No acute thoracic spine fracture. Heterogeneous right anterior renal mass with calcifications, which should be further evaluated with a multiphase contrast enhanced MRI or CT. Electronically Signed   By: Maurine Simmering M.D.   On: 03/21/2021 13:00   DG Chest Port 1 View  Result Date: 03/21/2021 CLINICAL DATA:  Fall from ladder EXAM: PORTABLE CHEST 1 VIEW COMPARISON:  06/13/2020 FINDINGS: The heart size and mediastinal contours are within normal limits. Both lungs are clear. The visualized skeletal structures are unremarkable. IMPRESSION: No active disease. Electronically Signed   By: Franchot Gallo M.D.   On: 03/21/2021 11:21   DG Ankle Left Port  Result Date: 03/21/2021 CLINICAL DATA:  Fall from ladder EXAM: PORTABLE LEFT ANKLE - 2 VIEW COMPARISON:  None. FINDINGS: Fracture dislocation of the ankle. Comminuted and angulated and displaced fracture distal fibula. Fracture of the medial malleolus. One shaft width of lateral displacement of the talus relative to the tibia. 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 Electronically Signed   By: Franchot Gallo M.D.   On: 03/21/2021 11:23   DG MINI C-ARM IMAGE ONLY  Result Date: 03/21/2021 There is no interpretation for this exam.  This order is for images obtained during a surgical procedure.  Please See "Surgeries" Tab for more information regarding the procedure.    Review of Systems  no recent f/c/n/v/wt loss.  10 system  review is o/w negative  Blood pressure 139/73, pulse 79, temperature 97.9 F (36.6 C), temperature source Oral, resp. rate 18, height 5\' 6"  (1.676 m), weight 74.8 kg, SpO2 96 %. Physical Exam  40 wd male in nad.  A and O x 4.  Normal mood and affect.  EOMI.  Resp unlabored.  L ankle with swelling and  intact skin.  No lymphadenopathy.  Active strength in PF and DF of the toes.  Sens to LT intact dorsally and plantarly at the toes.  Brisk cap refill at the toes.  R wrist without gross deformity or swelling.  Intact sens to LT in the radial, median and ulnar n dist.  5/5 strength at grip.  Not significantly TTP.  Assessment/Plan L ankle trimal fracture s/p closed reduction and splinting.  To the OR today for ORIF.  The risks and benefits of the alternative treatment options have been discussed in detail.  The patient wishes to proceed with surgery and specifically understands risks of bleeding, infection, nerve damage, blood clots, need for additional surgery, amputation and death.   R wrist pain - xrays show no fracture.  We'll observe for resolution and consider further imaging if his wrist continues to hurt.  [name], MD 03/21/2021, 4:22 PM

## 2021-03-21 NOTE — Sedation Documentation (Signed)
Pt respirations assisted by RT with BVM

## 2021-03-21 NOTE — Anesthesia Preprocedure Evaluation (Addendum)
Anesthesia Evaluation  Patient identified by MRN, date of birth, ID band Patient awake    Reviewed: Allergy & Precautions, NPO status , Patient's Chart, lab work & pertinent test results  History of Anesthesia Complications Negative for: history of anesthetic complications  Airway Mallampati: II  TM Distance: >3 FB Neck ROM: Full    Dental  (+) Dental Advisory Given   Pulmonary neg pulmonary ROS,    breath sounds clear to auscultation       Cardiovascular hypertension, Pt. on medications (-) angina+ Peripheral Vascular Disease   Rhythm:Regular Rate:Normal  '22 ECHO: EF 60-65%. The LV has normal function, no regional wall motion abnormalities. There is mild LVH, trivial MR, mild AS with mean gradient 11.0 mmHg, Vmax  2.40 m/s.    Neuro/Psych negative neurological ROS  negative psych ROS   GI/Hepatic negative GI ROS, Neg liver ROS,   Endo/Other  diabetes (glu 121), Oral Hypoglycemic Agents  Renal/GU Renal InsufficiencyRenal disease     Musculoskeletal   Abdominal   Peds  Hematology negative hematology ROS (+)   Anesthesia Other Findings   Reproductive/Obstetrics                            Anesthesia Physical Anesthesia Plan  ASA: 3  Anesthesia Plan: General   Post-op Pain Management: Ofirmev IV (intra-op)*   Induction: Intravenous  PONV Risk Score and Plan: 2 and Ondansetron and Dexamethasone  Airway Management Planned: LMA  Additional Equipment: None  Intra-op Plan:   Post-operative Plan:   Informed Consent: I have reviewed the patients History and Physical, chart, labs and discussed the procedure including the risks, benefits and alternatives for the proposed anesthesia with the patient or authorized representative who has indicated his/her understanding and acceptance.     Dental advisory given  Plan Discussed with: CRNA and Surgeon  Anesthesia Plan Comments: (Plan  routine monitors, GA, Dr Victorino Dike will use local with Exparel rather than peripheral nerve blocks)       Anesthesia Quick Evaluation

## 2021-03-22 ENCOUNTER — Encounter (HOSPITAL_COMMUNITY): Payer: Self-pay | Admitting: Orthopedic Surgery

## 2021-03-22 LAB — GLUCOSE, CAPILLARY
Glucose-Capillary: 246 mg/dL — ABNORMAL HIGH (ref 70–99)
Glucose-Capillary: 119 mg/dL — ABNORMAL HIGH (ref 70–99)

## 2021-03-22 LAB — HEMOGLOBIN A1C
Hgb A1c MFr Bld: 6 % — ABNORMAL HIGH (ref 4.8–5.6)
Mean Plasma Glucose: 126 mg/dL

## 2021-03-22 MED ORDER — OXYCODONE HCL 5 MG PO TABS: 5.0000 mg | ORAL_TABLET | Freq: Four times a day (QID) | ORAL | 0 refills | Status: AC | PRN

## 2021-03-22 MED ORDER — ACETAMINOPHEN 325 MG PO TABS: 325.0000 mg | ORAL_TABLET | Freq: Four times a day (QID) | ORAL | Status: AC | PRN

## 2021-03-22 NOTE — TOC Transition Note (Signed)
Transition of Care [facility]) - CM/SW Discharge Note   Patient Details  Name: Scott Howard MRN: [id number] Date of Birth: March 03, 1960  Transition of Care [facility]) CM/SW Contact:  Sharin Mons, RN Phone Number: 03/22/2021, 2:43 PM   Clinical Narrative:    Patient will DC to: home Anticipated DC date: 03/22/2021 Family notified: yes Transport by: car        S/p L ankle ORIF on 03/21/2021 Per MD patient ready for DC today . RN, patient,and  patient's girlfriend, Levada Dy  notified of DC. DME crutches provided to pt prior to d/c. Pt states will f/u with [facility] regarding DME scooter. Pt states girlfriend (nurse) will assist with needs once d/c. Pt without Rx med concerns AVS. Girlfriend to provide transportation home. Jonn Shingles [facility]      RNCM will sign off for now as intervention is no longer needed. Please consult [place] again if new needs arise.    Final next level of care: Home/Self Care Barriers to Discharge: No Barriers Identified   Patient Goals and CMS Choice        Discharge Placement                       Discharge Plan and Services                                     Social Determinants of Health (SDOH) Interventions     Readmission Risk Interventions No flowsheet data found.

## 2021-03-22 NOTE — Evaluation (Signed)
Occupational Therapy Evaluation Patient Details Name: Scott Howard MRN: [id number] DOB: 07/16/1960 Today's Date: 03/22/2021   History of Present Illness 61 y.o. male presents to [facility] hospital on 03/21/2021 after fall, sustaining L trimalleolar fx. Pt underwent L ankle ORIF on 03/21/2021. PMH includes COVID-19, DM, HLD, HTN, PDV.   Clinical Impression   Patient evaluated by Occupational Therapy with no further acute OT needs identified. All education has been completed and the patient has no further questions. Pt is able to complete ADLs mod I, and demonstrates good understanding of precautions and safety.  See below for any follow-up Occupational Therapy or equipment needs. OT is signing off. Thank you for this referral.       Recommendations for follow up therapy are one component of a multi-disciplinary discharge planning process, led by the attending physician.  Recommendations may be updated based on patient status, additional functional criteria and insurance authorization.   Follow Up Recommendations  No OT follow up    Assistance Recommended at Discharge Intermittent Supervision/Assistance  Patient can return home with the following Assistance with cooking/housework;Assist for transportation;Help with stairs or ramp for entrance    Functional Status Assessment  Patient has had a recent decline in their functional status and demonstrates the ability to make significant improvements in function in a reasonable and predictable amount of time.  Equipment Recommendations  None recommended by OT    Recommendations for Other Services       Precautions / Restrictions Precautions Precautions: Fall Restrictions Weight Bearing Restrictions: Yes LLE Weight Bearing: Non weight bearing      Mobility Bed Mobility               General bed mobility comments: sitting up in chair    Transfers Overall transfer level: Modified independent                         Balance   Sitting-balance support: No upper extremity supported, Feet supported Sitting balance-Leahy Scale: Good     Standing balance support: During functional activity, No upper extremity supported Standing balance-Leahy Scale: Fair Standing balance comment: able to maintain balance briefly without UE support while maintaining NWB                           ADL either performed or assessed with clinical judgement   ADL Overall ADL's : Modified independent                                       General ADL Comments: Pt able to safely simulate how to pull pants over hips whilc maintaining NWB, and able to perform simulated shower transfer mod I with no LOB and good adherance to NWB     Vision         Perception     Praxis      Pertinent Vitals/Pain Pain Assessment Pain Assessment: Faces Faces Pain Scale: Hurts little more Pain Location: L ankle Pain Descriptors / Indicators: Grimacing Pain Intervention(s): Monitored during session     Hand Dominance Right   Extremity/Trunk Assessment Upper Extremity Assessment Upper Extremity Assessment: Overall WFL for tasks assessed   Lower Extremity Assessment Lower Extremity Assessment: Defer to PT evaluation   Cervical / Trunk Assessment Cervical / Trunk Assessment: Normal   Communication Communication Communication: No difficulties   Cognition Arousal/Alertness: Awake/alert  Behavior During Therapy: WFL for tasks assessed/performed Overall Cognitive Status: Within Functional Limits for tasks assessed                                       General Comments  Instructed pt to keep cell phone with him at all times and to use back pack, cross body bag to carry objects if needed    Exercises     Shoulder Instructions      Home Living Family/patient expects to be discharged to:: Private residence Living Arrangements: Alone Available Help at Discharge: Other  (Comment);Family;Available PRN/intermittently (significant other and sister) Type of Home: House Home Access: Stairs to enter [employer] of Steps: 3 Entrance Stairs-Rails: Right Home Layout: One level     Bathroom Shower/Tub: Walk-in shower;Tub/shower unit   Bathroom Toilet: Standard     Home Equipment: Agricultural consultant (2 wheels);Grab bars - tub/shower;Shower seat          Prior Functioning/Environment Prior Level of Function : Independent/Modified Independent               ADLs Comments: Works as an Copywriter, advertising for [employer]        OT Problem List:        OT Treatment/Interventions:      OT Goals(Current goals can be found in the care plan section) Acute Rehab OT Goals Patient Stated Goal: to get back to normal OT Goal Formulation: All assessment and education complete, DC therapy  OT Frequency:      Co-evaluation              AM-PAC OT "6 Clicks" Daily Activity     Outcome Measure                 End of Session    Activity Tolerance:   Patient left:                     Time: 9735-3299 OT Time Calculation (min): 12 min Charges:  OT General Charges $OT Visit: 1 Visit OT Evaluation $OT Eval Low Complexity: 1 Low  Eber Jones., OTR/L Acute Rehabilitation Services Pager [phone] Office [phone]   Jeani Hawking M 03/22/2021, 11:40 AM

## 2021-03-22 NOTE — Discharge Summary (Signed)
Physician Discharge Summary  Patient ID: JANDEL PATRIARCA MRN: [id number] DOB/AGE: 61-Jul-1962 61 y.o.  Admit date: 03/21/2021 Discharge date: 03/22/2021  Admission Diagnoses:  left ankle trimal fracture, diabetes, PVD  Discharge Diagnoses:  Principal Problem:   Ankle fracture, left Active Problems:   Closed displaced trimalleolar fracture of left ankle Same as above  Discharged Condition: good  Hospital Course: Pt was admitted and taken to ssurgery for ORIF.  He tolerated the procedure well without evident complication.  PT and OT needs were evalualted.  Pt is discharged to home in stable condition.  F/u in two weeks.  NWB on the L LE.  Consults: orthopedic surgery  Significant Diagnostic Studies: xrays  Treatments: surgery: ORIF L ankle fracture  Discharge Exam: Blood pressure (!) 88/54, pulse 87, temperature 98 F (36.7 C), temperature source Oral, resp. rate 18, height 5\' 6"  (1.676 m), weight 74.8 kg, SpO2 92 %. General appearance: no distress Extremities: L LE dressed and dry.  Splint intact.  R wrist without TTP.  Brisk cap refill at the left toes.  Intact sens to LT at the toes. Incision/Wound:  dressed.  Disposition: Discharge disposition: 01-Home or Self Care       Discharge Instructions     Call MD / Call 911   Complete by: As directed    If you experience chest pain or shortness of breath, CALL 911 and be transported to the hospital emergency room.  If you develope a fever above 101 F, pus (white drainage) or increased drainage or redness at the wound, or calf pain, call your surgeon's office.   Constipation Prevention   Complete by: As directed    Drink plenty of fluids.  Prune juice may be helpful.  You may use a stool softener, such as Colace (over the counter) 100 mg twice a day.  Use MiraLax (over the counter) for constipation as needed.   Diet - low sodium heart healthy   Complete by: As directed    Increase activity slowly as tolerated   Complete by: As  directed    Non weight bearing   Complete by: As directed    Laterality: left   Extremity: Lower   Post-operative opioid taper instructions:   Complete by: As directed    POST-OPERATIVE OPIOID TAPER INSTRUCTIONS: It is important to wean off of your opioid medication as soon as possible. If you do not need pain medication after your surgery it is ok to stop day one. Opioids include: Codeine, Hydrocodone(Norco, Vicodin), Oxycodone(Percocet, oxycontin) and hydromorphone amongst others.  Long term and even short term use of opiods can cause: Increased pain response Dependence Constipation Depression Respiratory depression And more.  Withdrawal symptoms can include Flu like symptoms Nausea, vomiting And more Techniques to manage these symptoms Hydrate well Eat regular healthy meals Stay active Use relaxation techniques(deep breathing, meditating, yoga) Do Not substitute Alcohol to help with tapering If you have been on opioids for less than two weeks and do not have pain than it is ok to stop all together.  Plan to wean off of opioids This plan should start within one week post op of your joint replacement. Maintain the same interval or time between taking each dose and first decrease the dose.  Cut the total daily intake of opioids by one tablet each day Next start to increase the time between doses. The last dose that should be eliminated is the evening dose.         Allergies as of 03/22/2021  Reactions   Silvadene [silver Sulfadiazine] Rash   Sulfa Antibiotics Rash   Linezolid Rash        Medication List     STOP taking these medications    HYDROcodone-acetaminophen 5-325 MG tablet Commonly known as: NORCO/VICODIN       TAKE these medications    acetaminophen 325 MG tablet Commonly known as: TYLENOL Take 1-2 tablets (325-650 mg total) by mouth every 6 (six) hours as needed for mild pain (pain score 1-3 or temp > 100.5).   aspirin 81 MG EC  tablet Take 1 tablet (81 mg total) by mouth daily. Swallow whole.   clopidogrel 75 MG tablet Commonly known as: Plavix Take 1 tablet (75 mg total) by mouth daily.   glipiZIDE 5 MG tablet Commonly known as: GLUCOTROL Take 5 mg by mouth 2 (two) times daily.   lisinopril 10 MG tablet Commonly known as: ZESTRIL Take 2 tablets (20 mg total) by mouth daily. What changed: how much to take   Multi-Vitamin tablet Take 1 tablet by mouth daily.   oxyCODONE 5 MG immediate release tablet Commonly known as: Oxy IR/ROXICODONE Take 1 tablet (5 mg total) by mouth every 6 (six) hours as needed for up to 5 days for severe pain.   pravastatin 20 MG tablet Commonly known as: PRAVACHOL Take 20 mg by mouth at bedtime.   tadalafil 20 MG tablet Commonly known as: CIALIS Take 20 mg by mouth daily as needed for erectile dysfunction.               Discharge Care Instructions  (From admission, onward)           Start     Ordered   03/22/21 0000  Non weight bearing       Question Answer Comment  Laterality left   Extremity Lower      03/22/21 1211            Follow-up Information     Toni Arthurs, MD. Schedule an appointment as soon as possible for a visit in 2 week(s).   Specialty: Orthopedic Surgery Contact information: [address] [place] [address] [phone]         Toni Arthurs, MD Follow up in 2 week(s).   Specialty: Orthopedic Surgery Contact information: [address] [place] [address] [place] [address] [phone]                 Signed: Toni Arthurs 03/22/2021, 12:12 PM

## 2021-03-22 NOTE — Evaluation (Signed)
Physical Therapy Evaluation Patient Details Name: Scott Howard MRN: [id number] DOB: Apr 27, 1960 Today's Date: 03/22/2021  History of Present Illness  61 y.o. male presents to [facility] hospital on 03/21/2021 after fall, sustaining L trimalleolar fx. Pt underwent L ankle ORIF on 03/21/2021. PMH includes COVID-19, DM, HLD, HTN, PDV.  Clinical Impression  Pt presents to PT with deficits in balance, gait, functional mobility, endurance. Pt requires some assistance to steady with stair negotiation, provided with gait belt to utilize at home for safety. Pt demonstrates good use of walker and knee scooter to ambulate at this time. Pt expresses confidence in his mobility and appears eager to discharge home. Pt will continue to benefit from gait and stair training during this admission to aide in a return to independence.       Recommendations for follow up therapy are one component of a multi-disciplinary discharge planning process, led by the attending physician.  Recommendations may be updated based on patient status, additional functional criteria and insurance authorization.  Follow Up Recommendations No PT follow up    Assistance Recommended at Discharge PRN  Patient can return home with the following  A little help with walking and/or transfers;A little help with bathing/dressing/bathroom;Assistance with cooking/housework;Assist for transportation;Help with stairs or ramp for entrance    Equipment Recommendations Crutches;Other (comment) (knee scooter)  Recommendations for Other Services       Functional Status Assessment Patient has had a recent decline in their functional status and demonstrates the ability to make significant improvements in function in a reasonable and predictable amount of time.     Precautions / Restrictions Precautions Precautions: Fall Restrictions Weight Bearing Restrictions: Yes LLE Weight Bearing: Non weight bearing      Mobility  Bed Mobility Overal bed  mobility: Modified Independent                  Transfers Overall transfer level: Needs assistance Equipment used: Rolling walker (2 wheels), Crutches Transfers: Sit to/from Stand, Bed to chair/wheelchair/BSC Sit to Stand: Supervision, Min assist Stand pivot transfers: Supervision, Min assist         General transfer comment: minA with crutches due to imbalance, supervision with RW. Stand and pivot from bed to knee scooter    Ambulation/Gait Ambulation/Gait assistance: Min guard Gait Distance (Feet): 20 Feet Assistive device: Rolling walker (2 wheels) Gait Pattern/deviations:  (hop-to gait) Gait velocity: reduced Gait velocity interpretation: <1.8 ft/sec, indicate of risk for recurrent falls   General Gait Details: pt with steady hop-to gait with use of RW. Pt also mobilizing for 30' within room on knee scooter, supervision for turns.  Stairs Stairs: Yes Stairs assistance: Min assist Stair Management: One rail Right, Step to pattern, Forwards, With crutches Number of Stairs: 2 General stair comments: 2 trials of ascending and descending 2 steps with crutch and RW  Wheelchair Mobility    Modified Rankin (Stroke Patients Only)       Balance Overall balance assessment: Needs assistance Sitting-balance support: No upper extremity supported, Feet supported Sitting balance-Leahy Scale: Good     Standing balance support: Bilateral upper extremity supported, Reliant on assistive device for balance Standing balance-Leahy Scale: Poor                               Pertinent Vitals/Pain Pain Assessment Pain Assessment: Faces Faces Pain Scale: Hurts little more Pain Location: L ankle Pain Descriptors / Indicators: Grimacing Pain Intervention(s): Monitored during session  Home Living Family/patient expects to be discharged to:: Private residence Living Arrangements: Alone Available Help at Discharge: Other (Comment);Family;Available  PRN/intermittently (significant other and sister) Type of Home: House Home Access: Stairs to enter Entrance Stairs-Rails: Right Entrance Stairs-Number of Steps: 3   Home Layout: One level Home Equipment: Rolling Walker (2 wheels);Grab bars - tub/shower;Shower seat      Prior Function Prior Level of Function : Independent/Modified Independent                     Hand Dominance   Dominant Hand: Right    Extremity/Trunk Assessment   Upper Extremity Assessment Upper Extremity Assessment: Overall WFL for tasks assessed    Lower Extremity Assessment Lower Extremity Assessment: Overall WFL for tasks assessed (L ankle splinted)    Cervical / Trunk Assessment Cervical / Trunk Assessment: Normal  Communication   Communication: No difficulties  Cognition Arousal/Alertness: Awake/alert Behavior During Therapy: WFL for tasks assessed/performed Overall Cognitive Status: Within Functional Limits for tasks assessed                                          General Comments General comments (skin integrity, edema, etc.): VSS on RA    Exercises     Assessment/Plan    PT Assessment Patient needs continued PT services  PT Problem List Decreased activity tolerance;Decreased balance;Decreased mobility       PT Treatment Interventions DME instruction;Gait training;Stair training;Functional mobility training;Therapeutic activities;Therapeutic exercise;Balance training;Patient/family education    PT Goals (Current goals can be found in the Care Plan section)  Acute Rehab PT Goals Patient Stated Goal: to return home PT Goal Formulation: With patient Time For Goal Achievement: 04/05/21 Potential to Achieve Goals: Good    Frequency Min 5X/week     Co-evaluation               AM-PAC PT "6 Clicks" Mobility  Outcome Measure Help needed turning from your back to your side while in a flat bed without using bedrails?: None Help needed moving from lying  on your back to sitting on the side of a flat bed without using bedrails?: None Help needed moving to and from a bed to a chair (including a wheelchair)?: A Little Help needed standing up from a chair using your arms (e.g., wheelchair or bedside chair)?: A Little Help needed to walk in hospital room?: A Little Help needed climbing 3-5 steps with a railing? : A Little 6 Click Score: 20    End of Session   Activity Tolerance: Patient tolerated treatment well Patient left: in chair;with call bell/phone within reach;with family/visitor present Nurse Communication: Mobility status PT Visit Diagnosis: Unsteadiness on feet (R26.81);Other abnormalities of gait and mobility (R26.89);History of falling (Z91.81)    Time: 0920-1010 PT Time Calculation (min) (ACUTE ONLY): 50 min   Charges:   PT Evaluation $PT Eval Low Complexity: 1 Low PT Treatments $Gait Training: 8-22 mins $Therapeutic Activity: 8-22 mins        Arlyss Gandy, PT, DPT Acute Rehabilitation Pager: [phone] Office [phone]   Arlyss Gandy 03/22/2021, 10:26 AM

## 2021-03-22 NOTE — Progress Notes (Signed)
Orthopedic Tech Progress Note Patient Details:  Scott Howard Jun 18, 1960 [id number]  RN called for crutches for discharge for patient    Ortho Devices Type of Ortho Device: Crutches Ortho Device/Splint Location: LLE Ortho Device/Splint Interventions: Ordered, Application, Adjustment   Post Interventions Patient Tolerated: Well, Ambulated well Instructions Provided: Poper ambulation with device, Care of device  [name] 03/22/2021, 2:47 PM

## 2021-03-22 NOTE — TOC CAGE-AID Note (Signed)
Transition of Care [facility]) - CAGE-AID Screening   Patient Details  Name: Scott Howard MRN: [id number] Date of Birth: 03-Mar-1960  [facility] CM/SW Contact:    Darnel Mchan C Tarpley-Carter, LCSWA Phone Number: 03/22/2021, 2:33 PM   Clinical Narrative: Pt participated in Cage-Aid.  Pt stated he does not use substance or ETOH.  Pt was not offered resources, due to no usage of substance or ETOH.     Tiyonna Sardinha Tarpley-Carter, MSW, LCSW-A Pronouns:  She/Her/[place] Transitions of Care Clinical Social Worker Direct Number:  [phone] [email or web address]   CAGE-AID Screening:    Have You Ever Felt You Ought to Cut Down on Your Drinking or Drug Use?: No Have People Annoyed You By [employer] Your Drinking Or Drug Use?: No Have You Felt Bad Or Guilty About Your Drinking Or Drug Use?: No Have You Ever Had a Drink or Used Drugs First Thing In The Morning to Steady Your Nerves or to Get Rid of a Hangover?: No CAGE-AID Score: 0  Substance Abuse Education Offered: No

## 2021-03-23 ENCOUNTER — Encounter (INDEPENDENT_AMBULATORY_CARE_PROVIDER_SITE_OTHER): Payer: 59

## 2021-03-23 ENCOUNTER — Ambulatory Visit (INDEPENDENT_AMBULATORY_CARE_PROVIDER_SITE_OTHER): Payer: 59 | Admitting: Vascular Surgery

## 2021-05-10 ENCOUNTER — Other Ambulatory Visit (INDEPENDENT_AMBULATORY_CARE_PROVIDER_SITE_OTHER): Payer: Self-pay | Admitting: Nurse Practitioner

## 2021-05-10 DIAGNOSIS — Z9889 Other specified postprocedural states: Secondary | ICD-10-CM

## 2021-05-15 ENCOUNTER — Ambulatory Visit (INDEPENDENT_AMBULATORY_CARE_PROVIDER_SITE_OTHER): Payer: 59

## 2021-05-15 ENCOUNTER — Ambulatory Visit (INDEPENDENT_AMBULATORY_CARE_PROVIDER_SITE_OTHER): Payer: 59 | Admitting: Vascular Surgery

## 2021-05-15 ENCOUNTER — Encounter (INDEPENDENT_AMBULATORY_CARE_PROVIDER_SITE_OTHER): Payer: Self-pay | Admitting: Vascular Surgery

## 2021-05-15 VITALS — BP 171/85 | HR 66 | Resp 16 | Wt 174.8 lb

## 2021-05-15 DIAGNOSIS — I739 Peripheral vascular disease, unspecified: Secondary | ICD-10-CM

## 2021-05-15 DIAGNOSIS — E785 Hyperlipidemia, unspecified: Secondary | ICD-10-CM

## 2021-05-15 DIAGNOSIS — Z9889 Other specified postprocedural states: Secondary | ICD-10-CM

## 2021-05-15 DIAGNOSIS — I10 Essential (primary) hypertension: Secondary | ICD-10-CM | POA: Diagnosis not present

## 2021-05-15 DIAGNOSIS — I70213 Atherosclerosis of native arteries of extremities with intermittent claudication, bilateral legs: Secondary | ICD-10-CM

## 2021-05-15 DIAGNOSIS — E782 Mixed hyperlipidemia: Secondary | ICD-10-CM

## 2021-05-15 DIAGNOSIS — E1169 Type 2 diabetes mellitus with other specified complication: Secondary | ICD-10-CM

## 2021-05-15 DIAGNOSIS — I70219 Atherosclerosis of native arteries of extremities with intermittent claudication, unspecified extremity: Secondary | ICD-10-CM | POA: Insufficient documentation

## 2021-05-15 NOTE — Progress Notes (Signed)
? ? ? ? ?MRN : [id number] ? ?Scott Howard is a 61 y.o. (07/19/1960) male who presents with chief complaint of check circulation. ? ?History of Present Illness:  ?The patient returns to the office for followup and review of the noninvasive studies. There have been no interval changes in lower extremity symptoms. No interval shortening of the patient's claudication distance or development of rest pain symptoms. No new ulcers or wounds of the right leg have occurred since the last visit. ?  ?Patient broke his ankle recently and has been in a cast, now he is in a boot.  He notes the cast caused a superficial ulcer of the left foot.  He feels it's improving. ?  ?The patient denies amaurosis fugax or recent TIA symptoms. There are no recent neurological changes noted. ?The patient denies history of DVT, PE or superficial thrombophlebitis. ?The patient denies recent episodes of angina or shortness of breath.  ?  ?ABI Rt=1.00 and Lt=[name] biphasic  (Previous ABI Rt=1.12 and Lt=[name] triphasic)  ?Duplex ultrasound of the lower extremities shows patent arterial system with triphasic/biphasic signals  ? ?Current Meds  ?Medication Sig  ? acetaminophen (TYLENOL) 325 MG tablet Take 1-2 tablets (325-650 mg total) by mouth every 6 (six) hours as needed for mild pain (pain score 1-3 or temp > 100.5).  ? aspirin EC 81 MG EC tablet Take 1 tablet (81 mg total) by mouth daily. Swallow whole.  ? clopidogrel (PLAVIX) 75 MG tablet Take 1 tablet (75 mg total) by mouth daily.  ? glipiZIDE (GLUCOTROL) 5 MG tablet Take 5 mg by mouth 2 (two) times daily.  ? Multiple Vitamin (MULTI-VITAMIN) tablet Take 1 tablet by mouth daily.  ? pravastatin (PRAVACHOL) 20 MG tablet Take 20 mg by mouth at bedtime.  ? tadalafil (CIALIS) 20 MG tablet Take 20 mg by mouth daily as needed for erectile dysfunction.  ? ? ?Past Medical History:  ?Diagnosis Date  ? COVID-19   ? 05/2020  ? Diabetes mellitus without complication ([place])   ? Heart murmur   ? Mild  ? Hyperlipidemia    ? borderline  ? Hypertension   ? Peripheral vascular disease ([name])   ? ? ?Past Surgical History:  ?Procedure Laterality Date  ? AMPUTATION Right 08/11/2020  ? Procedure: Right 1st and 5th Ray Resection;  Surgeon: [name], DPM;  Location: [facility];  Service: Podiatry;  Laterality: Right;  ? AMPUTATION TOE Left 05/29/2020  ? Procedure: 4th and 5th TOE AMPUTATION WITH PARTIAL RAY RESECTION;  Surgeon: [name], DPM;  Location: [facility];  Service: Podiatry;  Laterality: Left;  ? AMPUTATION TOE Right 01/27/2021  ? Procedure: AMPUTATION TOE - 2ND MPJ;  Surgeon: [name], DPM;  Location: [facility];  Service: Podiatry;  Laterality: Right;  ? CATARACT EXTRACTION W/PHACO Right 09/19/2020  ? Procedure: CATARACT EXTRACTION PHACO AND INTRAOCULAR LENS PLACEMENT ([place]) RIGHT INTRVITREAL TRIESCENCE DIABETIC;  Surgeon: Eulogio Bear, MD;  Location: [place];  Service: Ophthalmology;  Laterality: Right;  Diabetic - oral meds ?4.55 ?00:36.1  ? CATARACT EXTRACTION W/PHACO Left 10/10/2020  ? Procedure: CATARACT EXTRACTION PHACO AND INTRAOCULAR LENS PLACEMENT ([facility]) LEFT INTRAVITREAL TRIESCENCE DIABETIC 4.86 00:38.7;  Surgeon: Eulogio Bear, MD;  Location: [place];  Service: Ophthalmology;  Laterality: Left;  Diabetic - oral meds  ? HAND SURGERY Left   ? LOWER EXTREMITY ANGIOGRAPHY Left 05/27/2020  ? Procedure: Lower Extremity Angiography;  Surgeon: Katha Cabal, MD;  Location: [place] [facility];  Service: Cardiovascular;  Laterality: Left;  ? LOWER EXTREMITY ANGIOGRAPHY Right 05/31/2020  ? Procedure: Lower Extremity Angiography;  Surgeon: Katha Cabal, MD;  Location: [place] [facility];  Service: Cardiovascular;  Laterality: Right;  ? LOWER EXTREMITY ANGIOGRAPHY Right 08/10/2020  ? Procedure: Lower Extremity Angiography;  Surgeon: Katha Cabal, MD;  Location: [place] [facility];  Service: Cardiovascular;  Laterality: Right;  ? ORIF ANKLE FRACTURE Left 03/21/2021  ? Procedure: OPEN  REDUCTION INTERNAL FIXATION (ORIF) ANKLE FRACTURE;  Surgeon: [name], MD;  Location: [place];  Service: Orthopedics;  Laterality: Left;  ? TONSILLECTOMY AND ADENOIDECTOMY    ? ? ?Social History ?Social History  ? ?Tobacco Use  ? Smoking status: Never  ? Smokeless tobacco: Never  ?Vaping Use  ? Vaping Use: Never used  ?Substance Use Topics  ? Alcohol use: Yes  ?  Comment: ocassionally  ? Drug use: Never  ? ? ?Family History ?Family History  ?Problem Relation Age of Onset  ? Cancer Mother   ? Cancer Father   ? Hypertension Brother   ? Congestive Heart Failure Brother   ? ? ?Allergies  ?Allergen Reactions  ? Silvadene [Silver Sulfadiazine] Rash  ? Sulfa Antibiotics Rash  ? Linezolid Rash  ? Levofloxacin   ?  Other reaction(s): Unknown  ? ? ? ?REVIEW OF SYSTEMS (Negative unless checked) ? ?Constitutional: [] Weight loss  [] Fever  [] Chills ?Cardiac: [] Chest pain   [] Chest pressure   [] Palpitations   [] Shortness of breath when laying flat   [] Shortness of breath with exertion. ?Vascular:  [x] Pain in legs with walking   [] Pain in legs at rest  [] History of DVT   [] Phlebitis   [] Swelling in legs   [] Varicose veins   [] Non-healing ulcers ?Pulmonary:   [] Uses home oxygen   [] Productive cough   [] Hemoptysis   [] Wheeze  [] COPD   [] Asthma ?Neurologic:  [] Dizziness   [] Seizures   [] History of stroke   [] History of TIA  [] Aphasia   [] Vissual changes   [] Weakness or numbness in arm   [] Weakness or numbness in leg ?Musculoskeletal:   [] Joint swelling   [] Joint pain   [] Low back pain ?Hematologic:  [] Easy bruising  [] Easy bleeding   [] Hypercoagulable state   [] Anemic ?Gastrointestinal:  [] Diarrhea   [] Vomiting  [] Gastroesophageal reflux/heartburn   [] Difficulty swallowing. ?Genitourinary:  [] Chronic kidney disease   [] Difficult urination  [] Frequent urination   [] Blood in urine ?Skin:  [] Rashes   [] Ulcers  ?Psychological:  [] History of anxiety   []  History of major depression. ? ?Physical Examination ? ?Vitals:  ? 05/15/21 0851   ?BP: (!) 171/85  ?Pulse: 66  ?Resp: 16  ?Weight: 174 lb 12.8 oz (79.3 kg)  ? ?Body mass index is 28.21 kg/m?. ?Gen: WD/WN, NAD ?Head: [place]/AT, No temporalis wasting.  ?Ear/Nose/Throat: Hearing grossly intact, nares w/o erythema or drainage ?Eyes: PER, EOMI, sclera nonicteric.  ?Neck: Supple, no masses.  No bruit or JVD.  ?Pulmonary:  Good air movement, no audible wheezing, no use of accessory muscles.  ?Cardiac: RRR, normal S1, S2, no Murmurs. ?Vascular:  mild trophic changes, superficial open wound left ?Vessel Right Left  ?Radial Palpable Palpable  ?PT Not Palpable Not Palpable  ?DP Not Palpable Not Palpable  ?Gastrointestinal: soft, non-distended. No guarding/no peritoneal signs.  ?Musculoskeletal: M/S 5/5 throughout.  No visible deformity.  ?Neurologic: CN 2-12 intact. Pain and light touch intact in extremities.  Symmetrical.  Speech is fluent. Motor exam as listed above. ?Psychiatric: Judgment intact, Mood & affect appropriate for pt's clinical  situation. ?Dermatologic: No rashes or ulcers noted.  No changes consistent with cellulitis. ? ? ?CBC ?Lab Results  ?Component Value Date  ? WBC 7.7 03/21/2021  ? HGB 14.5 03/21/2021  ? HCT 44.1 03/21/2021  ? MCV 92.3 03/21/2021  ? PLT 172 03/21/2021  ? ? ?BMET ?   ?Component Value Date/Time  ? NA 138 03/21/2021 1109  ? K 4.9 03/21/2021 1109  ? CL 105 03/21/2021 1109  ? CO2 26 03/21/2021 1109  ? GLUCOSE 121 (H) 03/21/2021 1109  ? BUN 30 (H) 03/21/2021 1109  ? CREATININE 1.25 (H) 03/21/2021 1109  ? CALCIUM 8.8 (L) 03/21/2021 1109  ? GFRNONAA >60 03/21/2021 1109  ? ?CrCl cannot be calculated (Patient's most recent lab result is older than the maximum 21 days allowed.). ? ?COAG ?Lab Results  ?Component Value Date  ? INR 1.0 03/21/2021  ? INR 1.0 05/27/2020  ? ? ?Radiology ?No results found. ? ? ?Assessment/Plan ?1. Atherosclerosis of native artery of both lower extremities with intermittent claudication ([place]) ? Recommend: ? ?The patient has evidence of atherosclerosis of the  lower extremities with claudication.  The patient does not voice lifestyle limiting changes at this point in time.  I have asked that he return sooner if the superficial wound on the left does not conti

## 2021-06-20 ENCOUNTER — Emergency Department: Payer: 59

## 2021-06-20 ENCOUNTER — Other Ambulatory Visit: Payer: Self-pay

## 2021-06-20 ENCOUNTER — Emergency Department
Admission: EM | Admit: 2021-06-20 | Discharge: 2021-06-20 | Disposition: A | Discharge status: Home / Self Care | Payer: 59 | Attending: Student in an Organized Health Care Education/Training Program | Admitting: Student in an Organized Health Care Education/Training Program

## 2021-06-20 DIAGNOSIS — Z4800 Encounter for change or removal of nonsurgical wound dressing: Secondary | ICD-10-CM | POA: Diagnosis present

## 2021-06-20 DIAGNOSIS — Z5189 Encounter for other specified aftercare: Secondary | ICD-10-CM

## 2021-06-20 LAB — CBC
HCT: 42.4 % (ref 39.0–52.0)
Hemoglobin: 13.7 g/dL (ref 13.0–17.0)
MCH: 29 pg (ref 26.0–34.0)
MCHC: 32.3 g/dL (ref 30.0–36.0)
MCV: 89.6 fL (ref 80.0–100.0)
Platelets: 191 10*3/uL (ref 150–400)
RBC: 4.73 MIL/uL (ref 4.22–5.81)
RDW: 11.9 % (ref 11.5–15.5)
WBC: 6.4 10*3/uL (ref 4.0–10.5)
nRBC: 0 % (ref 0.0–0.2)

## 2021-06-20 LAB — BASIC METABOLIC PANEL
Anion gap: 6 (ref 5–15)
BUN: 28 mg/dL — ABNORMAL HIGH (ref 8–23)
CO2: 27 mmol/L (ref 22–32)
Calcium: 9.2 mg/dL (ref 8.9–10.3)
Chloride: 104 mmol/L (ref 98–111)
Creatinine, Ser: 1.18 mg/dL (ref 0.61–1.24)
GFR, Estimated: >60 mL/min (ref >60)
Glucose, Bld: 119 mg/dL — ABNORMAL HIGH (ref 70–99)
Potassium: 4.4 mmol/L (ref 3.5–5.1)
Sodium: 137 mmol/L (ref 135–145)

## 2021-06-20 LAB — LACTIC ACID, PLASMA: Lactic Acid, Venous: 0.7 mmol/L (ref 0.5–1.9)

## 2021-06-20 MED ORDER — BACITRACIN ZINC 500 UNIT/GM EX OINT
TOPICAL_OINTMENT | CUTANEOUS | Status: AC
  Filled 2021-06-20: qty 0.9

## 2021-06-20 NOTE — ED Notes (Signed)
Patient discharged to home per MD order. Patient in stable condition, and deemed medically cleared by ED provider for discharge. Discharge instructions reviewed with patient/family using "Teach Back"; verbalized understanding of medication education and administration, and information about follow-up care. Denies further concerns. ° °

## 2021-06-20 NOTE — ED Notes (Signed)
Feet wrapped per patients home care instructions, betadine dressing to right foot and antibiotic dressing to left foot.

## 2021-06-20 NOTE — ED Notes (Signed)
1 set of cultures sent to lab if needed.

## 2021-06-20 NOTE — ED Provider Notes (Signed)
[facility] Provider Note    Event Date/Time   First MD Initiated Contact with Patient 06/20/21 0920     (approximate)   History   Wound Check   HPI  Scott Howard is a 61 y.o. male   with a history of diabetic polyneuropathy status post multiple episodes of diabetic foot ulcers and amputations followed by podiatry presents to the ER for concern over possible worsening of ulcer of left foot.  No drainage no fevers.  He is on antibiotics.  States that he was instructed by podiatry clinic to come to the ER for further evaluation MRI possibly.      Physical Exam   Triage Vital Signs: ED Triage Vitals  Enc Vitals Group     BP 06/20/21 0803 (!) 162/77     Pulse Rate 06/20/21 0803 77     Resp 06/20/21 0803 16     Temp 06/20/21 0803 98.5 F (36.9 C)     Temp Source 06/20/21 0803 Oral     SpO2 06/20/21 0803 97 %     Weight 06/20/21 0809 164 lb (74.4 kg)     Height 06/20/21 0809 5\' 6"  (1.676 m)     Head Circumference --      Peak Flow --      Pain Score 06/20/21 0809 0     Pain Loc --      Pain Edu? --      Excl. in [place]? --     Most recent vital signs: Vitals:   06/20/21 0803  BP: (!) 162/77  Pulse: 77  Resp: 16  Temp: 98.5 F (36.9 C)  SpO2: 97%     Constitutional: Alert  Eyes: Conjunctivae are normal.  Head: Atraumatic. Nose: No congestion/rhinnorhea. Mouth/Throat: Mucous membranes are moist.   Neck: Painless ROM.  Cardiovascular:   Good peripheral circulation. Respiratory: Normal respiratory effort.  No retractions.  Gastrointestinal: Soft and nontender.  Musculoskeletal: Status post bilateral partial ray amputation, left dorsum of the foot has nonerythematous nonpurulent ulcer and superficial blister base of the second toe no overlying erythema warmth or induration. Neurologic:  MAE spontaneously. No gross focal neurologic deficits are appreciated.  Skin:  Skin is warm, dry and intact. No rash noted. Psychiatric: Mood and affect  are normal. Speech and behavior are normal.    ED Results / Procedures / Treatments   Labs (all labs ordered are listed, but only abnormal results are displayed) Labs Reviewed  BASIC METABOLIC PANEL - Abnormal; Notable for the following components:      Result Value   Glucose, Bld 119 (*)    BUN 28 (*)    All other components within normal limits  CBC  LACTIC ACID, PLASMA  LACTIC ACID, PLASMA     EKG     RADIOLOGY Please see ED Course for my review and interpretation.  I personally reviewed all radiographic images ordered to evaluate for the above acute complaints and reviewed radiology reports and findings.  These findings were personally discussed with the patient.  Please see medical record for radiology report.    PROCEDURES:  Critical Care performed: No  Procedures   MEDICATIONS ORDERED IN ED: Medications - No data to display   IMPRESSION / MDM / [place] / ED COURSE  I reviewed the triage vital signs and the nursing notes.  Differential diagnosis includes, but is not limited to, diabetic foot ulcer, abscess, cellulitis, osteo-  Patient presented to the ER for evaluation of symptoms as described above.  Currently afebrile, vss. This presenting complaint could reflect a potentially life-threatening illness therefore laboratory and radiographic evaluation will be sent to evaluate for the above complaints.  I consulted with Dr. Cleda Mccreedy of podiatry who has agreed to come evaluate patient at bedside.  X-ray of the foot on my interpretation does not show any evidence of fracture.  Per radiology report no acute findings.  Dr. Cleda Mccreedy being familiar with the patient's case and management, states it appears stable and would be appropriate for close outpatient follow-up this week in his clinic.  No indication for emergent MRI at this time.  Do not feel that patient requires hospitalization at this time.  Demonstrates good understanding  of signs and symptoms for which he should return to the ER.  Patient agreeable to plan        FINAL CLINICAL IMPRESSION(S) / ED DIAGNOSES   Final diagnoses:  Visit for wound check     Rx / DC Orders   ED Discharge Orders     None        Note:  This document was prepared using Dragon voice recognition software and may include unintentional dictation errors.    Merlyn Lot, MD 06/20/21 [phone]

## 2021-06-20 NOTE — Discharge Instructions (Signed)
Follow up with Dr. Alberteen Spindle this week.  Continue antibiotics as prescribed.  Return for any additional questions or concerns.

## 2021-06-20 NOTE — ED Triage Notes (Signed)
Pt has a left foot wound and has been treating it according to his podiatrist recommendations, appt this coming Fri. Pt states he was sent here for an MRI. Wound is round with white blanchable perimeter and red in the center. Wound is warm to touch and not draining.

## 2021-07-14 ENCOUNTER — Other Ambulatory Visit (HOSPITAL_COMMUNITY): Payer: Self-pay | Admitting: Orthopedic Surgery

## 2021-07-17 ENCOUNTER — Encounter (HOSPITAL_COMMUNITY): Payer: Self-pay | Admitting: Orthopedic Surgery

## 2021-07-17 NOTE — Progress Notes (Signed)
PCP - Dr Clydie Braun Cardiologist - n/a  Chest x-ray - 03/21/21 (1V) EKG - DOS Stress Test - n/a ECHO - 08/13/20 Cardiac Cath - n/a  ICD Pacemaker/Loop - n/a  Sleep Study -  n/a CPAP - none  THE NIGHT BEFORE SURGERY and THE MORNING OF SURGERY, do not take Glipizide.    If your blood sugar is less than 70 mg/dL, you will need to treat for low blood sugar: Treat a low blood sugar (less than 70 mg/dL) with  cup of clear juice (cranberry or apple), 4 glucose tablets, OR glucose gel. Recheck blood sugar in 15 minutes after treatment (to make sure it is greater than 70 mg/dL). If your blood sugar is not greater than 70 mg/dL on recheck, call [phone] for further instructions.  ASA/Blood Thinner Instructions:  Last dose of Plavix and Aspirin was on 07/16/21.  MD aware.  ERAS: Clear liquids til 10:30 AM DOS  Anesthesia review: Yes   STOP now taking any Aspirin (unless otherwise instructed by your surgeon), Aleve, Naproxen, Ibuprofen, Motrin, Advil, Goody's, BC's, all herbal medications, fish oil, and all vitamins.   Coronavirus Screening Do you have any of the following symptoms:  Cough yes/no: No Fever (>100.68F)  yes/no: No Runny nose yes/no: No Sore throat yes/no: No Difficulty breathing/shortness of breath  yes/no: No  Have you traveled in the last 14 days and where? yes/no: No  Patient verbalized understanding of instructions that were given via phone.

## 2021-07-17 NOTE — Progress Notes (Signed)
Anesthesia Chart Review:  Case: [id number] Date/Time: 07/18/21 1329   Procedure: Right foot transmetatarsal amputation, achilles tendon lengthening (Right: Foot) - regional block  hold antiobiotics for cultures   Anesthesia type: General   Pre-op diagnosis: chronic multifocal osteomyelitis   [facility]   Surgeons: Toni Arthurs, MD       DISCUSSION: Patient is a 61 year old male scheduled for the above procedure.   History includes never smoker, HTN, HLD, DM2, murmur (mild AS 08/13/20 echo during osteo admission, PCP follow-up recommended), PAD (PTA left ATA/PTA 05/27/20; PTA right PTA 05/31/20 & right peroneal/right PTA 08/10/20), osteomyelitis (right 2nd amputation 01/27/21), left ankle trimalleolar fracture (following fall back from ladder; s/p open treatment with internal fixation 03/21/21).   Labs on 06/20/21 showed Cr 1.18, normal CBC. Last EKG noted was from 06/13/20, so update as indicated on arrival. Anesthesia team to evaluate on the day of surgery.   Per PAT RN documentation: "Last dose of Plavix and Aspirin was on 07/16/21.  MD aware."   VS: Ht 5\' 6"  (1.676 m)   Wt 74.4 kg   BMI 26.47 kg/m  BP Readings from Last 3 Encounters:  06/20/21 (!) 163/77  05/15/21 (!) 171/85  03/22/21 (!) 88/54   Pulse Readings from Last 3 Encounters:  06/20/21 88  05/15/21 66  03/22/21 87      PROVIDERS: 03/24/21, MD is PCP  Mick Sell, MD is vascular surgeon   LABS: Most recent lab results include: Lab Results  Component Value Date   WBC 6.4 06/20/2021   HGB 13.7 06/20/2021   HCT 42.4 06/20/2021   PLT 191 06/20/2021   GLUCOSE 119 (H) 06/20/2021   ALT 18 03/21/2021   AST 19 03/21/2021   NA 137 06/20/2021   K 4.4 06/20/2021   CL 104 06/20/2021   CREATININE 1.18 06/20/2021   BUN 28 (H) 06/20/2021   CO2 27 06/20/2021   INR 1.0 03/21/2021   HGBA1C 6.0 (H) 03/21/2021     IMAGES: Xray left foot 06/20/21:  IMPRESSION: 1. Postsurgical changes of  ORIF about the ankle and partial amputation of fourth and fifth metatarsals. Area not fully assessed. 2. No acute bony process. 3. Suspected soft tissue ulceration over the plantar aspect of the foot not well seen on AP and lateral projections potentially in the area of the soft tissues between the first and second toe. No underlying radiopaque foreign body or acute bony abnormality.  CT chest/abd/pelvis 03/21/21: IMPRESSION: - There is no demonstrable acute injury in chest, abdomen and pelvis. There is no laceration in the solid organs. There are no displaced fractures. - There is 2.3 cm smooth marginated mixed density lesion with coarse internal calcifications in the upper pole of right kidney which has increased in size since 05/31/2020. Possibility of neoplastic process is not excluded. Follow-up MRI may be considered. - Coronary artery calcifications are seen. Other findings as described in the body of the report.   EKG: 06/13/20: Sinus rhythm    CV: Echo 08/13/20 (during admission for right foot osteomyelitis; finding of grade I-II murmur): IMPRESSIONS   1. Left ventricular ejection fraction, by estimation, is 60 to 65%. The  left ventricle has normal function. The left ventricle has no regional  wall motion abnormalities. There is mild concentric left ventricular  hypertrophy. Left ventricular diastolic  parameters are indeterminate. The average left ventricular global  longitudinal strain is -17.9 %. The global longitudinal strain is normal.   2.  Right ventricular systolic function is normal. The right ventricular  size is normal. There is normal pulmonary artery systolic pressure.   3. Left atrial size was mildly dilated.   4. The mitral valve is normal in structure. Trivial mitral valve  regurgitation. No evidence of mitral stenosis.   5. The aortic valve is normal in structure. There is mild calcification  of the aortic valve. There is mild thickening of the aortic  valve. Aortic  valve regurgitation is not visualized. Mild aortic valve stenosis. Aortic  valve area, by VTI measures 1.42  cm. Aortic valve mean gradient measures 11.0 mmHg. Aortic valve Vmax  measures 2.40 m/s.   6. The inferior vena cava is normal in size with greater than 50%  respiratory variability, suggesting right atrial pressure of 3 mmHg.    Past Medical History:  Diagnosis Date   COVID-19    05/2020   Diabetes mellitus without complication (HCC)    Heart murmur    mild - patient denies this dx   Hyperlipidemia    borderline   Hypertension    Peripheral vascular disease ([place])    Pneumonia    x 1 in 20s    Past Surgical History:  Procedure Laterality Date   AMPUTATION Right 08/11/2020   Procedure: Right 1st and 5th Ray Resection;  Surgeon: [name], DPM;  Location: [facility];  Service: Podiatry;  Laterality: Right;   AMPUTATION TOE Left 05/29/2020   Procedure: 4th and 5th TOE AMPUTATION WITH PARTIAL RAY RESECTION;  Surgeon: [name], DPM;  Location: [facility];  Service: Podiatry;  Laterality: Left;   AMPUTATION TOE Right 01/27/2021   Procedure: AMPUTATION TOE - 2ND MPJ;  Surgeon: [name], DPM;  Location: [facility];  Service: Podiatry;  Laterality: Right;   CATARACT EXTRACTION W/PHACO Right 09/19/2020   Procedure: CATARACT EXTRACTION PHACO AND INTRAOCULAR LENS PLACEMENT ([place]) RIGHT INTRVITREAL TRIESCENCE DIABETIC;  Surgeon: Eulogio Bear, MD;  Location: [place];  Service: Ophthalmology;  Laterality: Right;  Diabetic - oral meds 4.55 00:36.1   CATARACT EXTRACTION W/PHACO Left 10/10/2020   Procedure: CATARACT EXTRACTION PHACO AND INTRAOCULAR LENS PLACEMENT ([facility]) LEFT INTRAVITREAL TRIESCENCE DIABETIC 4.86 00:38.7;  Surgeon: Eulogio Bear, MD;  Location: [place];  Service: Ophthalmology;  Laterality: Left;  Diabetic - oral meds   HAND SURGERY Left    LOWER EXTREMITY ANGIOGRAPHY Left 05/27/2020   Procedure: Lower Extremity Angiography;   Surgeon: Katha Cabal, MD;  Location: [place] [facility];  Service: Cardiovascular;  Laterality: Left;   LOWER EXTREMITY ANGIOGRAPHY Right 05/31/2020   Procedure: Lower Extremity Angiography;  Surgeon: Katha Cabal, MD;  Location: [place] [facility];  Service: Cardiovascular;  Laterality: Right;   LOWER EXTREMITY ANGIOGRAPHY Right 08/10/2020   Procedure: Lower Extremity Angiography;  Surgeon: Katha Cabal, MD;  Location: [place] [facility];  Service: Cardiovascular;  Laterality: Right;   ORIF ANKLE FRACTURE Left 03/21/2021   Procedure: OPEN REDUCTION INTERNAL FIXATION (ORIF) ANKLE FRACTURE;  Surgeon: [name], MD;  Location: [place];  Service: Orthopedics;  Laterality: Left;   TONSILLECTOMY AND ADENOIDECTOMY      MEDICATIONS: No current facility-administered medications for this encounter.    aspirin EC 81 MG EC tablet   Cholecalciferol (VITAMIN D3 PO)   clopidogrel (PLAVIX) 75 MG tablet   glipiZIDE (GLUCOTROL) 5 MG tablet   ibuprofen (ADVIL) 200 MG tablet   lisinopril (ZESTRIL) 10 MG tablet   Polyethyl Glycol-Propyl Glycol (SYSTANE ULTRA) 0.4-0.3 % SOLN  pravastatin (PRAVACHOL) 20 MG tablet   tadalafil (CIALIS) 20 MG tablet   acetaminophen (TYLENOL) 325 MG tablet    Shonna Chock, PA-C Surgical Short Stay/Anesthesiology [facility] Phone [phone] [facility] Phone [phone] 07/17/2021 2:12 PM

## 2021-07-17 NOTE — Anesthesia Preprocedure Evaluation (Signed)
Anesthesia Evaluation  Patient identified by MRN, date of birth, ID band Patient awake    Reviewed: Allergy & Precautions, NPO status , Patient's Chart, lab work & pertinent test results, reviewed documented beta blocker date and time   Airway Mallampati: II  TM Distance: >3 FB Neck ROM: Full    Dental  (+) Teeth Intact, Dental Advisory Given   Pulmonary pneumonia, resolved,    Pulmonary exam normal breath sounds clear to auscultation       Cardiovascular hypertension, Pt. on medications + Peripheral Vascular Disease  Normal cardiovascular exam+ Valvular Problems/Murmurs AS  Rhythm:Regular Rate:Normal  Echo 2022 LVEF 60-65%, mild AS   Neuro/Psych Peripheral neuropathy  Neuromuscular disease negative psych ROS   GI/Hepatic negative GI ROS, Neg liver ROS,   Endo/Other  diabetes, Poorly Controlled, Type 2, Oral Hypoglycemic AgentsHyperlipidemia  Renal/GU Renal InsufficiencyRenal disease  negative genitourinary   Musculoskeletal Osteomyelitis right foot   Abdominal   Peds  Hematology negative hematology ROS (+) Plavix therapy- last dose 07/16/21   Anesthesia Other Findings   Reproductive/Obstetrics                            Anesthesia Physical Anesthesia Plan  ASA: 3  Anesthesia Plan: MAC and General   Post-op Pain Management: Regional block* and Minimal or no pain anticipated   Induction: Intravenous  PONV Risk Score and Plan: 3 and Treatment may vary due to age or medical condition, Ondansetron and Propofol infusion  Airway Management Planned: Natural Airway and Simple Face Mask  Additional Equipment: None  Intra-op Plan:   Post-operative Plan:   Informed Consent: I have reviewed the patients History and Physical, chart, labs and discussed the procedure including the risks, benefits and alternatives for the proposed anesthesia with the patient or authorized representative who has  indicated his/her understanding and acceptance.     Dental advisory given  Plan Discussed with: Anesthesiologist and CRNA  Anesthesia Plan Comments: (PAT note written 07/17/2021 by Shonna Chock, PA-C. )       Anesthesia Quick Evaluation

## 2021-07-18 ENCOUNTER — Ambulatory Visit (HOSPITAL_COMMUNITY): Payer: 59 | Admitting: Vascular Surgery

## 2021-07-18 ENCOUNTER — Other Ambulatory Visit: Payer: Self-pay

## 2021-07-18 ENCOUNTER — Encounter (HOSPITAL_COMMUNITY): Payer: Self-pay | Admitting: Orthopedic Surgery

## 2021-07-18 ENCOUNTER — Observation Stay (HOSPITAL_COMMUNITY)
Admission: RE | Admit: 2021-07-18 | Discharge: 2021-07-19 | Disposition: A | Discharge status: Home Health | Payer: 59 | Attending: Orthopedic Surgery | Admitting: Orthopedic Surgery

## 2021-07-18 ENCOUNTER — Ambulatory Visit (HOSPITAL_BASED_OUTPATIENT_CLINIC_OR_DEPARTMENT_OTHER): Payer: 59 | Admitting: Vascular Surgery

## 2021-07-18 ENCOUNTER — Encounter (HOSPITAL_COMMUNITY)
Admission: RE | Disposition: A | Discharge status: Home Health | Payer: Self-pay | Source: Home / Self Care | Attending: Orthopedic Surgery

## 2021-07-18 DIAGNOSIS — Z7902 Long term (current) use of antithrombotics/antiplatelets: Secondary | ICD-10-CM | POA: Insufficient documentation

## 2021-07-18 DIAGNOSIS — M869 Osteomyelitis, unspecified: Secondary | ICD-10-CM

## 2021-07-18 DIAGNOSIS — E875 Hyperkalemia: Secondary | ICD-10-CM | POA: Diagnosis not present

## 2021-07-18 DIAGNOSIS — Z7984 Long term (current) use of oral hypoglycemic drugs: Secondary | ICD-10-CM | POA: Insufficient documentation

## 2021-07-18 DIAGNOSIS — Z7982 Long term (current) use of aspirin: Secondary | ICD-10-CM | POA: Insufficient documentation

## 2021-07-18 DIAGNOSIS — E1142 Type 2 diabetes mellitus with diabetic polyneuropathy: Secondary | ICD-10-CM | POA: Insufficient documentation

## 2021-07-18 DIAGNOSIS — M86071 Acute hematogenous osteomyelitis, right ankle and foot: Secondary | ICD-10-CM

## 2021-07-18 DIAGNOSIS — E1169 Type 2 diabetes mellitus with other specified complication: Secondary | ICD-10-CM | POA: Diagnosis not present

## 2021-07-18 DIAGNOSIS — E11621 Type 2 diabetes mellitus with foot ulcer: Secondary | ICD-10-CM | POA: Diagnosis present

## 2021-07-18 DIAGNOSIS — M6702 Short Achilles tendon (acquired), left ankle: Secondary | ICD-10-CM | POA: Diagnosis not present

## 2021-07-18 DIAGNOSIS — M868X7 Other osteomyelitis, ankle and foot: Secondary | ICD-10-CM | POA: Diagnosis not present

## 2021-07-18 DIAGNOSIS — L97519 Non-pressure chronic ulcer of other part of right foot with unspecified severity: Secondary | ICD-10-CM | POA: Diagnosis not present

## 2021-07-18 DIAGNOSIS — Z8616 Personal history of COVID-19: Secondary | ICD-10-CM | POA: Insufficient documentation

## 2021-07-18 DIAGNOSIS — I10 Essential (primary) hypertension: Secondary | ICD-10-CM | POA: Diagnosis not present

## 2021-07-18 HISTORY — PX: AMPUTATION: SHX166

## 2021-07-18 HISTORY — DX: Pneumonia, unspecified organism: J18.9

## 2021-07-18 LAB — GLUCOSE, CAPILLARY
Glucose-Capillary: 109 mg/dL — ABNORMAL HIGH (ref 70–99)
Glucose-Capillary: 122 mg/dL — ABNORMAL HIGH (ref 70–99)
Glucose-Capillary: 94 mg/dL (ref 70–99)
Glucose-Capillary: 108 mg/dL — ABNORMAL HIGH (ref 70–99)

## 2021-07-18 LAB — SEDIMENTATION RATE: Sed Rate: 31 mm/hr — ABNORMAL HIGH (ref 0–16)

## 2021-07-18 LAB — C-REACTIVE PROTEIN: CRP: 1.2 mg/dL — ABNORMAL HIGH (ref <1.0)

## 2021-07-18 SURGERY — AMPUTATION, FOOT, PARTIAL
Anesthesia: Monitor Anesthesia Care | Site: Foot | Laterality: Right

## 2021-07-18 SURGERY — AMPUTATION, FOOT, PARTIAL
Anesthesia: General | Site: Foot | Laterality: Right

## 2021-07-18 MED ORDER — MAGNESIUM SULFATE 2 GM/50ML IV SOLN: 2.0000 g | Freq: Every day | INTRAVENOUS | Status: DC | PRN

## 2021-07-18 MED ORDER — POLYVINYL ALCOHOL 1.4 % OP SOLN
2.0000 [drp] | Freq: Three times a day (TID) | OPHTHALMIC | Status: DC | PRN
Start: 2021-07-18 — End: 2021-07-19
  Filled 2021-07-18: qty 15

## 2021-07-18 MED ORDER — ORAL CARE MOUTH RINSE: 15.0000 mL | Freq: Once | OROMUCOSAL | Status: AC

## 2021-07-18 MED ORDER — LISINOPRIL 20 MG PO TABS
20.0000 mg | ORAL_TABLET | Freq: Every day | ORAL | Status: DC
Start: 2021-07-18 — End: 2021-07-19
  Administered 2021-07-18 – 2021-07-19 (×2): 20 mg via ORAL
  Filled 2021-07-18 (×2): qty 1

## 2021-07-18 MED ORDER — ROPIVACAINE HCL 5 MG/ML IJ SOLN
INTRAMUSCULAR | Status: DC | PRN
  Administered 2021-07-18: 30 mL via PERINEURAL

## 2021-07-18 MED ORDER — OXYCODONE HCL 5 MG/5ML PO SOLN: 5.0000 mg | Freq: Once | ORAL | Status: DC | PRN

## 2021-07-18 MED ORDER — VANCOMYCIN HCL 1500 MG/300ML IV SOLN
1500.0000 mg | Freq: Once | INTRAVENOUS | Status: AC
  Administered 2021-07-18: 1500 mg via INTRAVENOUS
  Filled 2021-07-18 (×2): qty 300

## 2021-07-18 MED ORDER — GLIPIZIDE 5 MG PO TABS
5.0000 mg | ORAL_TABLET | Freq: Two times a day (BID) | ORAL | Status: DC
Start: 2021-07-19 — End: 2021-07-19
  Administered 2021-07-19: 5 mg via ORAL
  Filled 2021-07-18: qty 1

## 2021-07-18 MED ORDER — POLYETHYL GLYCOL-PROPYL GLYCOL 0.4-0.3 % OP SOLN: 1.0000 [drp] | Freq: Three times a day (TID) | OPHTHALMIC | Status: DC | PRN

## 2021-07-18 MED ORDER — SENNA 8.6 MG PO TABS
1.0000 | ORAL_TABLET | Freq: Two times a day (BID) | ORAL | Status: DC
  Administered 2021-07-18 – 2021-07-19 (×2): 8.6 mg via ORAL
  Filled 2021-07-18 (×2): qty 1

## 2021-07-18 MED ORDER — ONDANSETRON HCL 4 MG/2ML IJ SOLN: 4.0000 mg | Freq: Four times a day (QID) | INTRAMUSCULAR | Status: DC | PRN

## 2021-07-18 MED ORDER — ONDANSETRON HCL 4 MG/2ML IJ SOLN
INTRAMUSCULAR | Status: DC | PRN
  Administered 2021-07-18: 4 mg via INTRAVENOUS

## 2021-07-18 MED ORDER — VANCOMYCIN HCL 500 MG IV SOLR
INTRAVENOUS | Status: AC
  Filled 2021-07-18: qty 10

## 2021-07-18 MED ORDER — METHOCARBAMOL 500 MG PO TABS: 500.0000 mg | ORAL_TABLET | Freq: Four times a day (QID) | ORAL | Status: DC | PRN

## 2021-07-18 MED ORDER — SODIUM CHLORIDE 0.9 % IV SOLN
2.0000 g | INTRAVENOUS | Status: DC
  Administered 2021-07-18: 2 g via INTRAVENOUS
  Filled 2021-07-18 (×2): qty 20

## 2021-07-18 MED ORDER — SODIUM CHLORIDE 0.9 % IV SOLN: INTRAVENOUS | Status: DC

## 2021-07-18 MED ORDER — FENTANYL CITRATE (PF) 100 MCG/2ML IJ SOLN
INTRAMUSCULAR | Status: AC
  Administered 2021-07-18: 50 ug via INTRAVENOUS
  Filled 2021-07-18: qty 2

## 2021-07-18 MED ORDER — ACETAMINOPHEN 325 MG PO TABS: 325.0000 mg | ORAL_TABLET | Freq: Four times a day (QID) | ORAL | Status: DC | PRN

## 2021-07-18 MED ORDER — PROPOFOL 10 MG/ML IV BOLUS
INTRAVENOUS | Status: DC | PRN
  Administered 2021-07-18: 80 mg via INTRAVENOUS

## 2021-07-18 MED ORDER — PANTOPRAZOLE SODIUM 40 MG PO TBEC
40.0000 mg | DELAYED_RELEASE_TABLET | Freq: Every day | ORAL | Status: DC
  Administered 2021-07-19: 40 mg via ORAL
  Filled 2021-07-18: qty 1

## 2021-07-18 MED ORDER — OXYCODONE HCL 5 MG PO TABS
10.0000 mg | ORAL_TABLET | ORAL | Status: DC | PRN
  Administered 2021-07-19 (×2): 10 mg via ORAL
  Filled 2021-07-18: qty 2

## 2021-07-18 MED ORDER — CLOPIDOGREL BISULFATE 75 MG PO TABS
75.0000 mg | ORAL_TABLET | Freq: Every day | ORAL | Status: DC
Start: 2021-07-18 — End: 2021-07-19
  Administered 2021-07-18 – 2021-07-19 (×2): 75 mg via ORAL
  Filled 2021-07-18 (×2): qty 1

## 2021-07-18 MED ORDER — OXYCODONE HCL 5 MG PO TABS: 5.0000 mg | ORAL_TABLET | Freq: Once | ORAL | Status: DC | PRN

## 2021-07-18 MED ORDER — 0.9 % SODIUM CHLORIDE (POUR BTL) OPTIME
TOPICAL | Status: DC | PRN
  Administered 2021-07-18: 1000 mL

## 2021-07-18 MED ORDER — MIDAZOLAM HCL 2 MG/2ML IJ SOLN: 1.0000 mg | Freq: Once | INTRAMUSCULAR | Status: AC

## 2021-07-18 MED ORDER — ORAL CARE MOUTH RINSE: 15.0000 mL | OROMUCOSAL | Status: DC | PRN

## 2021-07-18 MED ORDER — PRAVASTATIN SODIUM 40 MG PO TABS
20.0000 mg | ORAL_TABLET | Freq: Every day | ORAL | Status: DC
Start: 2021-07-18 — End: 2021-07-19
  Administered 2021-07-18: 20 mg via ORAL
  Filled 2021-07-18: qty 1

## 2021-07-18 MED ORDER — INSULIN ASPART 100 UNIT/ML IJ SOLN
0.0000 [IU] | Freq: Three times a day (TID) | INTRAMUSCULAR | Status: DC
  Administered 2021-07-19 (×2): 2 [IU] via SUBCUTANEOUS

## 2021-07-18 MED ORDER — MIDAZOLAM HCL 2 MG/2ML IJ SOLN
INTRAMUSCULAR | Status: AC
  Administered 2021-07-18: 1 mg via INTRAVENOUS
  Filled 2021-07-18: qty 2

## 2021-07-18 MED ORDER — VANCOMYCIN HCL 500 MG IV SOLR
INTRAVENOUS | Status: DC | PRN
  Administered 2021-07-18: 500 mg

## 2021-07-18 MED ORDER — LACTATED RINGERS IV SOLN: INTRAVENOUS | Status: DC

## 2021-07-18 MED ORDER — POTASSIUM CHLORIDE CRYS ER 20 MEQ PO TBCR: 20.0000 meq | EXTENDED_RELEASE_TABLET | Freq: Every day | ORAL | Status: DC | PRN

## 2021-07-18 MED ORDER — PHENYLEPHRINE HCL-NACL 20-0.9 MG/250ML-% IV SOLN
INTRAVENOUS | Status: DC | PRN
  Administered 2021-07-18: 25 ug/min via INTRAVENOUS

## 2021-07-18 MED ORDER — METOPROLOL TARTRATE 5 MG/5ML IV SOLN: 2.0000 mg | INTRAVENOUS | Status: DC | PRN

## 2021-07-18 MED ORDER — CEFAZOLIN SODIUM-DEXTROSE 2-4 GM/100ML-% IV SOLN
2.0000 g | INTRAVENOUS | Status: AC
  Administered 2021-07-18: 2 g via INTRAVENOUS
  Filled 2021-07-18: qty 100

## 2021-07-18 MED ORDER — DOCUSATE SODIUM 100 MG PO CAPS: 100.0000 mg | ORAL_CAPSULE | Freq: Every day | ORAL | Status: DC

## 2021-07-18 MED ORDER — LABETALOL HCL 5 MG/ML IV SOLN: 10.0000 mg | INTRAVENOUS | Status: DC | PRN

## 2021-07-18 MED ORDER — HYDRALAZINE HCL 20 MG/ML IJ SOLN: 5.0000 mg | INTRAMUSCULAR | Status: DC | PRN

## 2021-07-18 MED ORDER — FENTANYL CITRATE (PF) 100 MCG/2ML IJ SOLN: 50.0000 ug | Freq: Once | INTRAMUSCULAR | Status: AC

## 2021-07-18 MED ORDER — DIPHENHYDRAMINE HCL 12.5 MG/5ML PO ELIX: 12.5000 mg | ORAL_SOLUTION | ORAL | Status: DC | PRN

## 2021-07-18 MED ORDER — PHENOL 1.4 % MT LIQD: 1.0000 | OROMUCOSAL | Status: DC | PRN

## 2021-07-18 MED ORDER — INSULIN ASPART 100 UNIT/ML IJ SOLN: 0.0000 [IU] | INTRAMUSCULAR | Status: DC | PRN

## 2021-07-18 MED ORDER — METHOCARBAMOL 1000 MG/10ML IJ SOLN
500.0000 mg | Freq: Four times a day (QID) | INTRAVENOUS | Status: DC | PRN
  Filled 2021-07-18: qty 5

## 2021-07-18 MED ORDER — DOCUSATE SODIUM 100 MG PO CAPS
100.0000 mg | ORAL_CAPSULE | Freq: Two times a day (BID) | ORAL | Status: DC
  Administered 2021-07-18: 100 mg via ORAL
  Filled 2021-07-18 (×2): qty 1

## 2021-07-18 MED ORDER — HYDROMORPHONE HCL 1 MG/ML IJ SOLN: 0.5000 mg | INTRAMUSCULAR | Status: DC | PRN

## 2021-07-18 MED ORDER — OXYCODONE HCL 5 MG PO TABS
5.0000 mg | ORAL_TABLET | ORAL | Status: DC | PRN
  Filled 2021-07-18: qty 2

## 2021-07-18 MED ORDER — PROPOFOL 500 MG/50ML IV EMUL
INTRAVENOUS | Status: DC | PRN
  Administered 2021-07-18: 100 ug/kg/min via INTRAVENOUS

## 2021-07-18 MED ORDER — ONDANSETRON HCL 4 MG PO TABS: 4.0000 mg | ORAL_TABLET | Freq: Four times a day (QID) | ORAL | Status: DC | PRN

## 2021-07-18 MED ORDER — FENTANYL CITRATE (PF) 100 MCG/2ML IJ SOLN: 25.0000 ug | INTRAMUSCULAR | Status: DC | PRN

## 2021-07-18 MED ORDER — GUAIFENESIN-DM 100-10 MG/5ML PO SYRP: 15.0000 mL | ORAL_SOLUTION | ORAL | Status: DC | PRN

## 2021-07-18 MED ORDER — ALUM & MAG HYDROXIDE-SIMETH 200-200-20 MG/5ML PO SUSP: 15.0000 mL | ORAL | Status: DC | PRN

## 2021-07-18 MED ORDER — CHLORHEXIDINE GLUCONATE 0.12 % MT SOLN: 15.0000 mL | Freq: Once | OROMUCOSAL | Status: AC

## 2021-07-18 MED ORDER — ROPIVACAINE HCL 7.5 MG/ML IJ SOLN
INTRAMUSCULAR | Status: DC | PRN
  Administered 2021-07-18: 20 mL via PERINEURAL

## 2021-07-18 MED ORDER — CHLORHEXIDINE GLUCONATE 0.12 % MT SOLN
OROMUCOSAL | Status: AC
  Administered 2021-07-18: 15 mL via OROMUCOSAL
  Filled 2021-07-18: qty 15

## 2021-07-18 SURGICAL SUPPLY — 50 items
BAG COUNTER SPONGE SURGICOUNT (BAG) ×2 IMPLANT
BLADE SAW RECIP 87.9 MT (BLADE) IMPLANT
BNDG ELASTIC 4X5.8 VLCR STR LF (GAUZE/BANDAGES/DRESSINGS) ×1 IMPLANT
BNDG ELASTIC 6X5.8 VLCR STR LF (GAUZE/BANDAGES/DRESSINGS) ×3 IMPLANT
BNDG ESMARK 4X9 LF (GAUZE/BANDAGES/DRESSINGS) ×2 IMPLANT
CANISTER SUCT 3000ML PPV (MISCELLANEOUS) ×2 IMPLANT
CHLORAPREP W/TINT 26 (MISCELLANEOUS) ×2 IMPLANT
CUFF TOURN SGL QUICK 34 (TOURNIQUET CUFF)
CUFF TOURN SGL QUICK 42 (TOURNIQUET CUFF) IMPLANT
CUFF TRNQT CYL 34X4.125X (TOURNIQUET CUFF) IMPLANT
DRAPE U-SHAPE 47X51 STRL (DRAPES) ×4 IMPLANT
DRSG ADAPTIC 3X8 NADH LF (GAUZE/BANDAGES/DRESSINGS) ×2 IMPLANT
DRSG MEPITEL 4X7.2 (GAUZE/BANDAGES/DRESSINGS) ×2 IMPLANT
DRSG PAD ABDOMINAL 8X10 ST (GAUZE/BANDAGES/DRESSINGS) ×4 IMPLANT
ELECT REM PT RETURN 9FT ADLT (ELECTROSURGICAL) ×2
ELECTRODE REM PT RTRN 9FT ADLT (ELECTROSURGICAL) ×1 IMPLANT
GAUZE SPONGE 4X4 12PLY STRL (GAUZE/BANDAGES/DRESSINGS) ×2 IMPLANT
GLOVE BIO SURGEON STRL SZ8 (GLOVE) ×4 IMPLANT
GLOVE BIOGEL PI IND STRL 8 (GLOVE) ×2 IMPLANT
GLOVE BIOGEL PI INDICATOR 8 (GLOVE) ×2
GLOVE ECLIPSE 8.0 STRL XLNG CF (GLOVE) ×4 IMPLANT
GOWN STRL REUS W/ TWL LRG LVL3 (GOWN DISPOSABLE) ×1 IMPLANT
GOWN STRL REUS W/ TWL XL LVL3 (GOWN DISPOSABLE) ×2 IMPLANT
GOWN STRL REUS W/TWL LRG LVL3 (GOWN DISPOSABLE) ×1
GOWN STRL REUS W/TWL XL LVL3 (GOWN DISPOSABLE) ×2
KIT BASIN OR (CUSTOM PROCEDURE TRAY) ×2 IMPLANT
KIT TURNOVER KIT B (KITS) ×2 IMPLANT
NDL HYPO 25GX1X1/2 BEV (NEEDLE) IMPLANT
NEEDLE HYPO 25GX1X1/2 BEV (NEEDLE) IMPLANT
NS IRRIG 1000ML POUR BTL (IV SOLUTION) ×2 IMPLANT
PACK ORTHO EXTREMITY (CUSTOM PROCEDURE TRAY) ×2 IMPLANT
PAD ARMBOARD 7.5X6 YLW CONV (MISCELLANEOUS) ×4 IMPLANT
PAD CAST 4YDX4 CTTN HI CHSV (CAST SUPPLIES) ×1 IMPLANT
PADDING CAST ABS 6INX4YD NS (CAST SUPPLIES) ×1
PADDING CAST ABS COTTON 6X4 NS (CAST SUPPLIES) ×1 IMPLANT
PADDING CAST COTTON 4X4 STRL (CAST SUPPLIES) ×1
PADDING CAST COTTON 6X4 STRL (CAST SUPPLIES) ×2 IMPLANT
SPONGE T-LAP 18X18 ~~LOC~~+RFID (SPONGE) ×2 IMPLANT
STOCKINETTE IMPERVIOUS LG (DRAPES) IMPLANT
SUCTION FRAZIER HANDLE 10FR (MISCELLANEOUS) ×1
SUCTION TUBE FRAZIER 10FR DISP (MISCELLANEOUS) ×1 IMPLANT
SUT ETHILON 2 0 PSLX (SUTURE) ×4 IMPLANT
SUT MNCRL AB 3-0 PS2 18 (SUTURE) ×2 IMPLANT
SUT PDS AB 1 CT  36 (SUTURE) ×1
SUT PDS AB 1 CT 36 (SUTURE) ×1 IMPLANT
SYR CONTROL 10ML LL (SYRINGE) IMPLANT
TOWEL GREEN STERILE (TOWEL DISPOSABLE) ×2 IMPLANT
TOWEL GREEN STERILE FF (TOWEL DISPOSABLE) ×2 IMPLANT
TUBE CONNECTING 12X1/4 (SUCTIONS) ×2 IMPLANT
UNDERPAD 30X36 HEAVY ABSORB (UNDERPADS AND DIAPERS) ×2 IMPLANT

## 2021-07-18 NOTE — Op Note (Signed)
07/18/2021  2:34 PM  PATIENT:  Scott Howard  61 y.o. male  PRE-OPERATIVE DIAGNOSIS: 1.  Right forefoot diabetic ulcer (nonhealing) 2.  Right second and third metatarsal osteomyelitis 3.  Short right Achilles tendon  POST-OPERATIVE DIAGNOSIS: Same  Procedure(s): 1.  Percutaneous right Achilles tendon lengthening 2.  Right foot transmetatarsal amputation  SURGEON:  [name], MD  ASSISTANT: Mechele Claude, PA-C  ANESTHESIA:   MAC, regional  EBL:  minimal   TOURNIQUET:   Total Tourniquet Time Documented: Thigh (Right) - 25 minutes Total: Thigh (Right) - 25 minutes  COMPLICATIONS:  None apparent  DISPOSITION:  Extubated, awake and stable to recovery.  INDICATION FOR PROCEDURE: 61 year old male with a past medical history significant for diabetes complicated by peripheral neuropathy.  He has a longstanding plantar ulcer of the right forefoot that has been refractory to treatment with antibiotics and wound care.  An MRI reveals osteo myelitis of the second and third metatarsals.  He presents today for surgical treatment.  The risks and benefits of the alternative treatment options have been discussed in detail.  The patient wishes to proceed with surgery and specifically understands risks of bleeding, infection, nerve damage, blood clots, need for additional surgery, amputation and death.   PROCEDURE IN DETAIL: After preoperative consent was obtained and the correct operative site was identified, the patient was brought to the operating room and placed upon the operating table.  IV sedation was administered following regional anesthesia.  Antibiotics were held pending cultures.  Surgical timeout was taken.  The right lower extremity was prepped and draped in standard sterile fashion with a tourniquet around the thigh.  The extremity was elevated and the tourniquet was inflated to 250 mmHg.  A triple hemisection percutaneous Achilles tendon lengthening was performed with a #15 scalpel.   The ankle was then dorsiflexed to 30 degrees with the knee extended.  A fishmouth incision was made around the forefoot at the level of the metatarsal necks.  Dissection was carried sharply down through the subcutaneous tissues.  The dorsal flap was elevated in subperiosteal fashion.  A reciprocating saw was used to cut through all 5 metatarsals beveling the cuts appropriately.  The plantar soft tissues were then divided sharply contouring the plantar flap appropriately.  The forefoot was passed off the field.  On the back table cultures were obtained from the area of chronic osteomyelitis.  The specimen was then sent to pathology for examination of the proximal bone surfaces for residual osteomyelitis.  Perioperative antibiotics were then administered.  The cut surfaces of bone were beveled with a rasp.  The wound was irrigated copiously.  Vascular structures were cauterized.  5 mg of vancomycin powder was sprinkled in the wound.  The skin incision was closed with horizontal mattress sutures of 2-0 nylon.  Sterile dressings were applied followed by a well-padded short leg splint.  The tourniquet was released after application of the dressings.  The patient was awakened from anesthesia and transported to the recovery room in stable condition.  FOLLOW UP PLAN: Nonweightbearing on the right lower extremity.  Infectious disease consultation for postoperative antibiotics based on culture results and pathology.  Physical therapy consultation for nonweightbearing gait training.  Wound care consult for management of the left lower extremity ulcer.   Mechele Claude PA-C was present and scrubbed for the duration of the operative case. His assistance was essential in positioning the patient, prepping and draping, gaining and maintaining exposure, performing the operation, closing and dressing the wounds and  applying the splint.

## 2021-07-18 NOTE — Consult Note (Signed)
    [facility]       Reason for Consult: osteomyelitis    Referring Physician: Dr. Victorino Dike  Active Problems:   * No active hospital problems. *     Recommendations: Will start vancomycin and ceftriaxone after surgery  Assessment: He has osteomyelitis of his right 2nd and 3rd metatarsals by MRI and now undergoing TM amputation by Dr. Victorino Dike.  Currently in the OR about to undergo surgery.  Will start antibiotics as above today and complete consult tomorrow am.    Antibiotics: Perioperative cefazolin  HPI: Scott Howard is a 61 y.o. male with a history of diabetes, peripheral neuropathy  with a chronic, non-healing ulcer of his right forefoot here for transmetatarsal amputation.  Past Medical History:  Diagnosis Date   COVID-19    05/2020   Diabetes mellitus without complication (HCC)    Heart murmur    mild AS 05/14/21 echo   Hyperlipidemia    borderline   Hypertension    Peripheral vascular disease (HCC)    Pneumonia    x 1 in 20s    Social History   Tobacco Use   Smoking status: Never   Smokeless tobacco: Never  Vaping Use   Vaping Use: Never used  Substance Use Topics   Alcohol use: Yes    Comment: ocassionally   Drug use: Never    Family History  Problem Relation Age of Onset   Cancer Mother    Cancer Father    Hypertension Brother    Congestive Heart Failure Brother     Allergies  Allergen Reactions   Silvadene [Silver Sulfadiazine] Rash   Sulfa Antibiotics Rash   Zyvox [Linezolid] Rash   Levaquin [Levofloxacin]     Other reaction(s): Unknown    Physical Exam: Vitals:   07/18/21 1335 07/18/21 1340  BP: (!) 152/76 140/87  Pulse: 73 73  Resp: (!) 21 (!) 24  Temp:    SpO2: 94% 93%    Lab Results  Component Value Date   WBC 6.4 06/20/2021   HGB 13.7 06/20/2021   HCT 42.4 06/20/2021   MCV 89.6 06/20/2021   PLT 191 06/20/2021    Lab Results  Component Value Date   CREATININE 1.18 06/20/2021   BUN 28 (H)  06/20/2021   NA 137 06/20/2021   K 4.4 06/20/2021   CL 104 06/20/2021   CO2 27 06/20/2021    Lab Results  Component Value Date   ALT 18 03/21/2021   AST 19 03/21/2021   ALKPHOS 53 03/21/2021     Microbiology: No results found for this or any previous visit (from the past 240 hour(s)).  Gardiner Barefoot, MD [email or web address] 07/18/2021, 2:10 PM

## 2021-07-18 NOTE — Transfer of Care (Signed)
Immediate Anesthesia Transfer of Care Note  Patient: Scott Howard  Procedure(s) Performed: Right foot transmetatarsal amputation, achilles tendon lengthening (Right: Foot)  Patient Location: PACU  Anesthesia Type:MAC combined with regional for post-op pain  Level of Consciousness: awake, alert  and oriented  Airway & Oxygen Therapy: Patient Spontanous Breathing  Post-op Assessment: Report given to RN and Post -op Vital signs reviewed and stable  Post vital signs: Reviewed and stable  Last Vitals:  Vitals Value Taken Time  BP 120/64 07/18/21 1442  Temp    Pulse 68 07/18/21 1443  Resp 10 07/18/21 1443  SpO2 97 % 07/18/21 1443  Vitals shown include unvalidated device data.  Last Pain:  Vitals:   07/18/21 1223  TempSrc:   PainSc: 0-No pain         Complications: No notable events documented.

## 2021-07-18 NOTE — H&P (Signed)
Scott Howard is an 61 y.o. male.   Chief Complaint: right foot ulcer HPI: 61 y/o male with PMH of diabetes c/b peripheral neuropathy has a chronic nonhealing ulcer of the right plantar forefoot.  He also has a tight heelcord.  He has failed non op treatment to date including wound care and abx.  MRI shows osteomyelitis of the right 2nd and 3rd MTs.  Past Medical History:  Diagnosis Date   COVID-19    05/2020   Diabetes mellitus without complication (HCC)    Heart murmur    mild AS 05/14/21 echo   Hyperlipidemia    borderline   Hypertension    Peripheral vascular disease (HCC)    Pneumonia    x 1 in 20s    Past Surgical History:  Procedure Laterality Date   AMPUTATION Right 08/11/2020   Procedure: Right 1st and 5th Ray Resection;  Surgeon: Linus Galas, DPM;  Location: [facility];  Service: Podiatry;  Laterality: Right;   AMPUTATION TOE Left 05/29/2020   Procedure: 4th and 5th TOE AMPUTATION WITH PARTIAL RAY RESECTION;  Surgeon: Linus Galas, DPM;  Location: [facility];  Service: Podiatry;  Laterality: Left;   AMPUTATION TOE Right 01/27/2021   Procedure: AMPUTATION TOE - 2ND MPJ;  Surgeon: Linus Galas, DPM;  Location: [facility];  Service: Podiatry;  Laterality: Right;   CATARACT EXTRACTION W/PHACO Right 09/19/2020   Procedure: CATARACT EXTRACTION PHACO AND INTRAOCULAR LENS PLACEMENT (IOC) RIGHT INTRVITREAL TRIESCENCE DIABETIC;  Surgeon: Nevada Crane, MD;  Location: [facility];  Service: Ophthalmology;  Laterality: Right;  Diabetic - oral meds 4.55 00:36.1   CATARACT EXTRACTION W/PHACO Left 10/10/2020   Procedure: CATARACT EXTRACTION PHACO AND INTRAOCULAR LENS PLACEMENT (IOC) LEFT INTRAVITREAL TRIESCENCE DIABETIC 4.86 00:38.7;  Surgeon: Nevada Crane, MD;  Location: [facility];  Service: Ophthalmology;  Laterality: Left;  Diabetic - oral meds   HAND SURGERY Left    LOWER EXTREMITY ANGIOGRAPHY Left 05/27/2020   Procedure: Lower Extremity Angiography;  Surgeon: Renford Dills, MD;  Location: [facility];  Service: Cardiovascular;  Laterality: Left;   LOWER EXTREMITY ANGIOGRAPHY Right 05/31/2020   Procedure: Lower Extremity Angiography;  Surgeon: Renford Dills, MD;  Location: [facility];  Service: Cardiovascular;  Laterality: Right;   LOWER EXTREMITY ANGIOGRAPHY Right 08/10/2020   Procedure: Lower Extremity Angiography;  Surgeon: Renford Dills, MD;  Location: [facility];  Service: Cardiovascular;  Laterality: Right;   ORIF ANKLE FRACTURE Left 03/21/2021   Procedure: OPEN REDUCTION INTERNAL FIXATION (ORIF) ANKLE FRACTURE;  Surgeon: Toni Arthurs, MD;  Location: [facility];  Service: Orthopedics;  Laterality: Left;   TONSILLECTOMY AND ADENOIDECTOMY      Family History  Problem Relation Age of Onset   Cancer Mother    Cancer Father    Hypertension Brother    Congestive Heart Failure Brother    Social History:  reports that he has never smoked. He has never used smokeless tobacco. He reports current alcohol use. He reports that he does not use drugs.  Allergies:  Allergies  Allergen Reactions   Silvadene [Silver Sulfadiazine] Rash   Sulfa Antibiotics Rash   Zyvox [Linezolid] Rash   Levaquin [Levofloxacin]     Other reaction(s): Unknown    Medications Prior to Admission  Medication Sig Dispense Refill   acetaminophen (TYLENOL) 325 MG tablet Take 1-2 tablets (325-650 mg total) by mouth every 6 (six) hours as needed for mild pain (pain score 1-3 or temp >  100.5).     aspirin EC 81 MG EC tablet Take 1 tablet (81 mg total) by mouth daily. Swallow whole. 30 tablet 11   Cholecalciferol (VITAMIN D3 PO) Take 4,000 Units by mouth in the morning.     clopidogrel (PLAVIX) 75 MG tablet Take 1 tablet (75 mg total) by mouth daily. 90 tablet 3   glipiZIDE (GLUCOTROL) 5 MG tablet Take 5 mg by mouth 2 (two) times daily.     ibuprofen (ADVIL) 200 MG tablet Take 600 mg by mouth every 8 (eight) hours as needed (pain.).     lisinopril  (ZESTRIL) 10 MG tablet Take 2 tablets (20 mg total) by mouth daily. (Patient taking differently: Take 10 mg by mouth daily.) 60 tablet 0   Polyethyl Glycol-Propyl Glycol (SYSTANE ULTRA) 0.4-0.3 % SOLN Place 1-2 drops into both eyes 3 (three) times daily as needed (dry/irritated eyes.).     pravastatin (PRAVACHOL) 20 MG tablet Take 20 mg by mouth at bedtime.     tadalafil (CIALIS) 20 MG tablet Take 10 mg by mouth in the morning.      Results for orders placed or performed during the hospital encounter of 08/04/2021 (from the past 48 hour(s))  Glucose, capillary     Status: Abnormal   Collection Time: August 04, 2021 11:38 AM  Result Value Ref Range   Glucose-Capillary 109 (H) 70 - 99 mg/dL    Comment: Glucose reference range applies only to samples taken after fasting for at least 8 hours.   No results found.  Review of Systems  no recent f/c/n/v/wt loss.  Blood pressure (!) 148/74, pulse 73, temperature 98.1 F (36.7 C), temperature source Oral, resp. rate 18, height 5\' 6"  (1.676 m), weight 74.8 kg, SpO2 (!) 73 %. Physical Exam  Wn wd male in nad.  A and O x 4.  Normal mood and affect.  EOMI.  Resp unlabored.  R foot with plantar ulcer.  No cellulitis.  Heelcord is tight.  Dimished sens to LT at the forefoot bilat.  5/5 strength in PF and DF of the ankle and toes.  Assessment/Plan R chronic diabetic forefoot ulcer and tight heelcord.  To the OR today for perc achilles tendon lengthening and transmetatarsal amputaiton.  The risks and benefits of the alternative treatment options have been discussed in detail.  The patient wishes to proceed with surgery and specifically understands risks of bleeding, infection, nerve damage, blood clots, need for additional surgery, amputation and death.   , MD August 04, 2021, 1:30 PM

## 2021-07-18 NOTE — Anesthesia Procedure Notes (Addendum)
  Anesthesia Regional Block: Popliteal block   Pre-Anesthetic Checklist: , timeout performed,  Correct Patient, Correct Site, Correct Laterality,  Correct Procedure, Correct Position, site marked,  Risks and benefits discussed,  Surgical consent,  Pre-op evaluation,  At surgeon's request and post-op pain management  Laterality: Right  Prep: chloraprep       Needles:  Injection technique: Single-shot  Needle Type: Echogenic Stimulator Needle     Needle Length: 10cm  Needle Gauge: 21   Needle insertion depth: 6 cm   Additional Needles:   Procedures:,,,, ultrasound used (permanent image in chart),,    Narrative:  Start time: 07/18/2021 1:30 PM End time: 07/18/2021 1:35 PM Injection made incrementally with aspirations every 5 mL.  Performed by: Personally  Anesthesiologist: Mal Amabile, MD  Additional Notes: Timeout performed. Patient sedated. Relevant anatomy ID'd using [place]. Incremental 2-17ml injection of LA with frequent aspiration. Patient tolerated procedure well.

## 2021-07-18 NOTE — Anesthesia Procedure Notes (Signed)
Anesthesia Regional Block: Adductor canal block   Pre-Anesthetic Checklist: , timeout performed,  Correct Patient, Correct Site, Correct Laterality,  Correct Procedure, Correct Position, site marked,  Risks and benefits discussed,  Surgical consent,  Pre-op evaluation,  At surgeon's request  Laterality: Right  Prep: chloraprep       Needles:  Injection technique: Single-shot  Needle Type: Echogenic Stimulator Needle     Needle Length: 10cm  Needle Gauge: 21   Needle insertion depth: 7 cm   Additional Needles:   Procedures:,,,, ultrasound used (permanent image in chart),,    Narrative:  Start time: 07/18/2021 1:36 PM End time: 07/18/2021 1:41 PM Injection made incrementally with aspirations every 5 mL.  Performed by: Personally  Anesthesiologist: Mal Amabile, MD  Additional Notes: Timeout performed. Patient sedated. Relevant anatomy ID'd using [place]. Incremental 2-60ml injection of LA with frequent aspiration. Patient tolerated procedure well.     Right Adductor Canal Block

## 2021-07-18 NOTE — Discharge Instructions (Signed)
Scott Arthurs, MD [facility]  Please read the following information regarding your care after surgery.  Medications  You only need a prescription for the narcotic pain medicine (ex. oxycodone, Percocet, Norco).  All of the other medicines listed below are available over the counter. ? Aleve 2 pills twice a day for the first 3 days after surgery. ? acetominophen (Tylenol) 650 mg every 4-6 hours as you need for minor to moderate pain ? oxycodone as prescribed for severe pain  Narcotic pain medicine (ex. oxycodone, Percocet, Vicodin) will cause constipation.  To prevent this problem, take the following medicines while you are taking any pain medicine. ? docusate sodium (Colace) 100 mg twice a day ? senna (Senokot) 2 tablets twice a day  ? To help prevent blood clots resume clopidigril.  You should also get up every hour while you are awake to move around.    Weight Bearing ? Do not bear any weight on the operated leg or foot.  Cast / Splint / Dressing ? Keep your splint, cast or dressing clean and dry.  Don't put anything (coat hanger, pencil, etc) down inside of it.  If it gets damp, use a hair dryer on the cool setting to dry it.  If it gets soaked, call the office to schedule an appointment for a cast change.   After your dressing, cast or splint is removed; you may shower, but do not soak or scrub the wound.  Allow the water to run over it, and then gently pat it dry.  Swelling It is normal for you to have swelling where you had surgery.  To reduce swelling and pain, keep your toes above your nose for at least 3 days after surgery.  It may be necessary to keep your foot or leg elevated for several weeks.  If it hurts, it should be elevated.  Follow Up Call my office at [phone] when you are discharged from the hospital or surgery center to schedule an appointment to be seen two weeks after surgery.  Call my office at [phone] if you develop a fever >101.5 F, nausea, vomiting,  bleeding from the surgical site or severe pain.

## 2021-07-18 NOTE — Anesthesia Postprocedure Evaluation (Signed)
Anesthesia Post Note  Patient: Scott Howard  Procedure(s) Performed: Right foot transmetatarsal amputation, achilles tendon lengthening (Right: Foot)     Patient location during evaluation: PACU Anesthesia Type: MAC and Regional Level of consciousness: awake and alert and oriented Pain management: pain level controlled Vital Signs Assessment: post-procedure vital signs reviewed and stable Respiratory status: spontaneous breathing, nonlabored ventilation and respiratory function stable Cardiovascular status: stable and blood pressure returned to baseline Postop Assessment: no apparent nausea or vomiting Anesthetic complications: no   No notable events documented.  Last Vitals:  Vitals:   07/18/21 1442 07/18/21 1458  BP: 120/64 110/63  Pulse: 61 60  Resp: 15 20  Temp: 36.7 C   SpO2: 96% 94%    Last Pain:  Vitals:   07/18/21 1442  TempSrc:   PainSc: 0-No pain                 Henrietta Cieslewicz A.

## 2021-07-18 NOTE — Progress Notes (Signed)
Pharmacy Antibiotic Note  Scott Howard is a 61 y.o. male admitted on 07/18/2021 with R-foot osteo. Pharmacy has been consulted for Vancomycin + Rocephin dosing.   Intra-op Cefazolin IV given around 1400 and Vancomycin powder utilized  Plan: - Vancomycin 1500 mg IV x 1 - Will check a BMET for additional dosing recommendations - Rocephin 2g IV every 24 hours - Will continue to follow renal function, culture results, LOT, and antibiotic de-escalation plans   Height: 5\' 6"  (167.6 cm) Weight: 74.8 kg (165 lb) IBW/kg (Calculated) : 63.8  Temp (24hrs), Avg:98.1 F (36.7 C), Min:98.1 F (36.7 C), Max:98.1 F (36.7 C)  No results for input(s): "WBC", "CREATININE", "LATICACIDVEN", "VANCOTROUGH", "VANCOPEAK", "VANCORANDOM", "GENTTROUGH", "GENTPEAK", "GENTRANDOM", "TOBRATROUGH", "TOBRAPEAK", "TOBRARND", "AMIKACINPEAK", "AMIKACINTROU", "AMIKACIN" in the last 168 hours.  CrCl cannot be calculated (Patient's most recent lab result is older than the maximum 21 days allowed.).    Allergies  Allergen Reactions   Silvadene [Silver Sulfadiazine] Rash   Sulfa Antibiotics Rash   Zyvox [Linezolid] Rash   Levaquin [Levofloxacin]     Other reaction(s): Unknown    Thank you for allowing pharmacy to be a part of this patient's care.  , PharmD, BCPS Infectious Diseases Clinical Pharmacist 07/18/2021 2:45 PM   **Pharmacist phone directory can now be found on [email or web address] ([email or web address]).  Listed under [facility].

## 2021-07-19 ENCOUNTER — Encounter (HOSPITAL_COMMUNITY): Payer: Self-pay | Admitting: Orthopedic Surgery

## 2021-07-19 DIAGNOSIS — M86371 Chronic multifocal osteomyelitis, right ankle and foot: Secondary | ICD-10-CM | POA: Diagnosis not present

## 2021-07-19 DIAGNOSIS — E11621 Type 2 diabetes mellitus with foot ulcer: Secondary | ICD-10-CM | POA: Diagnosis not present

## 2021-07-19 DIAGNOSIS — M86071 Acute hematogenous osteomyelitis, right ankle and foot: Secondary | ICD-10-CM | POA: Diagnosis not present

## 2021-07-19 LAB — BASIC METABOLIC PANEL
Anion gap: 7 (ref 5–15)
BUN: 17 mg/dL (ref 8–23)
CO2: 25 mmol/L (ref 22–32)
Calcium: 8.6 mg/dL — ABNORMAL LOW (ref 8.9–10.3)
Chloride: 107 mmol/L (ref 98–111)
Creatinine, Ser: 1.11 mg/dL (ref 0.61–1.24)
GFR, Estimated: >60 mL/min (ref >60)
Glucose, Bld: 142 mg/dL — ABNORMAL HIGH (ref 70–99)
Potassium: 4.7 mmol/L (ref 3.5–5.1)
Sodium: 139 mmol/L (ref 135–145)

## 2021-07-19 LAB — CBC
HCT: 34.7 % — ABNORMAL LOW (ref 39.0–52.0)
Hemoglobin: 11.7 g/dL — ABNORMAL LOW (ref 13.0–17.0)
MCH: 29.8 pg (ref 26.0–34.0)
MCHC: 33.7 g/dL (ref 30.0–36.0)
MCV: 88.5 fL (ref 80.0–100.0)
Platelets: 201 10*3/uL (ref 150–400)
RBC: 3.92 MIL/uL — ABNORMAL LOW (ref 4.22–5.81)
RDW: 11.8 % (ref 11.5–15.5)
WBC: 6.7 10*3/uL (ref 4.0–10.5)
nRBC: 0 % (ref 0.0–0.2)

## 2021-07-19 LAB — GLUCOSE, CAPILLARY
Glucose-Capillary: 135 mg/dL — ABNORMAL HIGH (ref 70–99)
Glucose-Capillary: 149 mg/dL — ABNORMAL HIGH (ref 70–99)

## 2021-07-19 MED ORDER — OXYCODONE HCL 5 MG PO TABS: 5.0000 mg | ORAL_TABLET | ORAL | 0 refills | Status: AC | PRN

## 2021-07-19 MED ORDER — DOCUSATE SODIUM 100 MG PO CAPS: 100.0000 mg | ORAL_CAPSULE | Freq: Two times a day (BID) | ORAL | 0 refills | Status: DC

## 2021-07-19 MED ORDER — SENNA 8.6 MG PO TABS: 2.0000 | ORAL_TABLET | Freq: Two times a day (BID) | ORAL | 0 refills | Status: DC

## 2021-07-19 MED ORDER — MUPIROCIN CALCIUM 2 % EX CREA
TOPICAL_CREAM | Freq: Every day | CUTANEOUS | Status: DC
  Filled 2021-07-19: qty 15

## 2021-07-19 MED ORDER — VANCOMYCIN HCL 1500 MG/300ML IV SOLN: 1500.0000 mg | INTRAVENOUS | Status: DC

## 2021-07-19 MED ORDER — AMOXICILLIN-POT CLAVULANATE 250-62.5 MG/5ML PO SUSR: 250.0000 mg | Freq: Two times a day (BID) | ORAL | 0 refills | Status: AC

## 2021-07-19 NOTE — Progress Notes (Signed)
Inpatient Diabetes Program Recommendations  AACE/ADA: New Consensus Statement on Inpatient Glycemic Control (2015)  Target Ranges:  Prepandial:   less than 140 mg/dL      Peak postprandial:   less than 180 mg/dL (1-2 hours)      Critically ill patients:  140 - 180 mg/dL   Lab Results  Component Value Date   GLUCAP 149 (H) 07/19/2021   HGBA1C 6.0 (H) 03/21/2021    Review of Glycemic Control  Latest Reference Range & Units 07/18/21 14:45 07/18/21 16:36 07/18/21 19:30 07/19/21 07:24 07/19/21 11:13  Glucose-Capillary 70 - 99 mg/dL 051 (H) 94 833 (H) 582 (H) 149 (H)   Diabetes history: DM 2 Outpatient Diabetes medications:  Glucotrol 5 mg bid Current orders for Inpatient glycemic control:  Novolog 0-15 units tid with meals Glucotrol 5 mg bid  Inpatient Diabetes Program Recommendations:    Blood sugars currently within hospital goals.  Last A1C in Feb. 2023 was 6.0%.  Consider adding A1C to labs to assess past 2-3 months of glucose control.   Will follow.   Thanks,  Beryl Meager, RN, BC-ADM Inpatient Diabetes Coordinator Pager [phone]  (8a-5p)

## 2021-07-19 NOTE — Progress Notes (Signed)
Subjective: 1 Day Post-Op Procedure(s) (LRB): Right foot transmetatarsal amputation, achilles tendon lengthening (Right)  Patient reports pain as mild to moderate.  Eating breakfast upon arrival.  Denies fever, chills, N/V, CP, SOB.  Resting comfortably in bed.  Wife at bedside.  Objective:   VITALS:  Temp:  [97.8 F (36.6 C)-98.6 F (37 C)] 98.4 F (36.9 C) (06/21 0727) Pulse Rate:  [57-79] 77 (06/21 0727) Resp:  [12-27] 17 (06/21 0727) BP: (110-165)/(59-87) 149/68 (06/21 0727) SpO2:  [73 %-99 %] 96 % (06/21 0727) Weight:  [74.8 kg] 74.8 kg (06/20 1136)  General: WDWN patient in NAD. Psych:  Appropriate mood and affect. Neuro:  A&O x 3, Moving all extremities, sensation intact to light touch HEENT:  EOMs intact Chest:  Even non-labored respirations Skin:  SLS C/D/I, no rashes or lesions Extremities: warm/dry, no visible edema, erythema or echymosis.  No lymphadenopathy. Pulses: Popliteus 2+ MSK:  ROM: TKE, MMT: able to perform quad set    LABS No results for input(s): "HGB", "WBC", "PLT" in the last 72 hours. No results for input(s): "NA", "K", "CL", "CO2", "BUN", "CREATININE", "GLUCOSE" in the last 72 hours. No results for input(s): "LABPT", "INR" in the last 72 hours.   Assessment/Plan: 1 Day Post-Op Procedure(s) (LRB): Right foot transmetatarsal amputation, achilles tendon lengthening (Right)  NWB R LE Up with therapy Resume Plavix today Intra-op cultures pending.  Growing rare gram (-) rods. CRP - 1.2, Sed rate 31 ABX per ID recommendation. WOC nurse consulted for L diabetic foot ulcer. Plan for 2 week outpatient post-op visit.   Alfredo Martinez PA-C [facility] Office:  [phone]

## 2021-07-19 NOTE — Evaluation (Signed)
Occupational Therapy Evaluation/Discharge Patient Details Name: Scott Howard MRN: [id number] DOB: 1960-04-08 Today's Date: 07/19/2021   History of Present Illness 61 y.o. 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 Pt failed conservative treatment and opted for R transmetatarsal ampuation and R achilles tendon lengthening. PMH: DM, HLD, HTN, PVA, hx of L toe amputation.   Clinical Impression   PTA, pt lives alone, typically Independent in all daily tasks and works at [employer]. Pt presents now with post op pain that was well controlled with pain medication during session. Pt also with sore on plantar aspect of L foot, concerned about preventing this wound from worsening. Guided pt in ADL/mobility education at a wheelchair level to maintain R LE NWB and protect L foot sore. Overall, pt able to complete transfers to/from wheelchair, wheelchair mobility and ADLs with no more than Supervision. Educated re: w/c mgmt/safety, compensatory strategies for LB ADLs, mgmt of IADLs in the home from w/c level, and shoe/orthotic assessment for work shoes to optimize skin integrity.. Pt's girlfriend present, endorses adequate family support at home. Recommend w/c for at home use, as well as BSC as unsure w/c will fit in bathroom adequately at home. No further skilled OT services needed at acute level or on DC.     Recommendations for follow up therapy are one component of a multi-disciplinary discharge planning process, led by the attending physician.  Recommendations may be updated based on patient status, additional functional criteria and insurance authorization.   Follow Up Recommendations  No OT follow up    Assistance Recommended at Discharge Set up Supervision/Assistance  Patient can return home with the following Assistance with cooking/housework;Assist for transportation;Help with stairs or ramp for entrance    Functional Status Assessment  Patient has had a recent  decline in their functional status and demonstrates the ability to make significant improvements in function in a reasonable and predictable amount of time.  Equipment Recommendations  BSC/3in1;Wheelchair (measurements OT);Wheelchair cushion (measurements OT) (with elevating legrests)    Recommendations for Other Services       Precautions / Restrictions Precautions Precautions: Fall;Other (comment) Precaution Comments: sore on plantar aspect of L foot Restrictions Weight Bearing Restrictions: Yes RLE Weight Bearing: Non weight bearing      Mobility Bed Mobility Overal bed mobility: Modified Independent                  Transfers Overall transfer level: Needs assistance Equipment used: None Transfers: Bed to chair/wheelchair/BSC     Squat pivot transfers: Min guard       General transfer comment: min guard for safety in squat pivot bed> w/c and w/c <> toilet with use of grab bars. minor cues for technique with good carryover      Balance Overall balance assessment: Needs assistance Sitting-balance support: No upper extremity supported, Feet supported Sitting balance-Leahy Scale: Good     Standing balance support: Single extremity supported, During functional activity, Bilateral upper extremity supported Standing balance-Leahy Scale: Poor Standing balance comment: reliant on UE support to avoid WB to R LE                           ADL either performed or assessed with clinical judgement   ADL Overall ADL's : Needs assistance/impaired Eating/Feeding: Independent   Grooming: Modified independent;Sitting   Upper Body Bathing: Modified independent;Sitting   Lower Body Bathing: Supervison/ safety;Sit to/from stand;Sitting/lateral leans   Upper Body Dressing :  Modified independent;Sitting   Lower Body Dressing: Supervision/safety;Sitting/lateral leans;Sit to/from stand   Toilet Transfer: Min guard;Comfort height toilet;Grab bars Statistician  Details (indicate cue type and reason): educated on w/c mobility and set up of this DME for optimal transfer to/from toilet. good carryover of footrest and w/c brake mgmt. Toileting- Clothing Manipulation and Hygiene: Supervision/safety;Sitting/lateral lean;Sit to/from stand Toileting - Architect Details (indicate cue type and reason): no physical assist needed       General ADL Comments: Pt and significant other concerned about sore on L foot and now NWB RLE. Focus on wheelchair level education for ADLs/mobility with good carryover     Vision Baseline Vision/History: 1 Wears glasses Ability to See in Adequate Light: 0 Adequate Patient Visual Report: No change from baseline Vision Assessment?: No apparent visual deficits     Perception     Praxis      Pertinent Vitals/Pain Pain Assessment Pain Assessment: 0-10 Pain Score: 6  (at start of session, reduced to 0/10 after pain medication) Pain Location: R foot Pain Descriptors / Indicators: Grimacing, Guarding Pain Intervention(s): Monitored during session     Hand Dominance Right   Extremity/Trunk Assessment Upper Extremity Assessment Upper Extremity Assessment: Overall WFL for tasks assessed   Lower Extremity Assessment Lower Extremity Assessment: Defer to PT evaluation   Cervical / Trunk Assessment Cervical / Trunk Assessment: Normal   Communication Communication Communication: No difficulties   Cognition Arousal/Alertness: Awake/alert Behavior During Therapy: WFL for tasks assessed/performed Overall Cognitive Status: Within Functional Limits for tasks assessed                                       General Comments  Significant other, who was previously a wound care nurse present, supportive and with good questions    Exercises     Shoulder Instructions      Home Living Family/patient expects to be discharged to:: Private residence Living Arrangements: Alone Available Help at  Discharge: Family Type of Home: House Home Access: Stairs to enter Secretary/administrator of Steps: 3 Entrance Stairs-Rails: Right Home Layout: One level     Bathroom Shower/Tub: Walk-in shower;Tub/shower unit   Bathroom Toilet: Standard     Home Equipment: Agricultural consultant (2 wheels);Grab bars - tub/shower;Shower seat;Other (comment);Hand held shower head (darco shoes for B feet)          Prior Functioning/Environment Prior Level of Function : Independent/Modified Independent;Driving;Working/employed             Mobility Comments: typically no use of AD ADLs Comments: Works as an Copywriter, advertising for [employer] Problem List: Pain;Decreased knowledge of use of DME or AE      OT Treatment/Interventions:      OT Goals(Current goals can be found in the care plan section) Acute Rehab OT Goals Patient Stated Goal: get back to work OT Goal Formulation: All assessment and education complete, DC therapy  OT Frequency:      Co-evaluation              AM-PAC OT "6 Clicks" Daily Activity     Outcome Measure Help from another person eating meals?: None Help from another person taking care of personal grooming?: None Help from another person toileting, which includes using toliet, bedpan, or urinal?: A Little Help from another person bathing (including washing, rinsing, drying)?: A Little Help from another person to  put on and taking off regular upper body clothing?: None Help from another person to put on and taking off regular lower body clothing?: A Little 6 Click Score: 21   End of Session Equipment Utilized During Treatment: Other (comment) (wheelchair) Nurse Communication: Mobility status;Weight bearing status  Activity Tolerance: Patient tolerated treatment well Patient left: in chair;with call bell/phone within reach;with family/visitor present  OT Visit Diagnosis: Other abnormalities of gait and mobility (R26.89)                Time: 9373-4287 OT  Time Calculation (min): 37 min Charges:  OT General Charges $OT Visit: 1 Visit OT Evaluation $OT Eval Low Complexity: 1 Low OT Treatments $Self Care/Home Management : 8-22 mins  Bradd Canary, OTR/L Acute Rehab Services Office: [phone]   Lorre Munroe 07/19/2021, 8:11 AM

## 2021-07-19 NOTE — Plan of Care (Signed)
  Problem: Education: Goal: Ability to describe self-care measures that may prevent or decrease complications (Diabetes Survival Skills Education) will improve Outcome: Progressing   Problem: Fluid Volume: Goal: Ability to maintain a balanced intake and output will improve Outcome: Progressing   Problem: Nutritional: Goal: Maintenance of adequate nutrition will improve Outcome: Progressing   

## 2021-07-19 NOTE — Progress Notes (Signed)
Discharge instructions given to patient and significant other at bedside. Waiting for 3:1 delivery prior to discharge.  IV DC's cannula intact. Patient verbalized understanding of discharge instructions and medication administration.

## 2021-07-19 NOTE — Discharge Summary (Signed)
Physician Discharge Summary  Patient ID: Scott Howard MRN: [id number] DOB/AGE: January 29, 1961 61 y.o.  Admit date: 07/18/2021 Discharge date: 07/19/2021  Admission Diagnoses: Type II diabetes polyneuropathy; chronic diabetic foot ulcer, right foot osteomyelitis; hx of insomnia, hyperlipidemia, atherosclerosis, dehydration, acute prerenal axotemia, PVD, hypomagnesemia, hypotension, AKI, Covid-19 infection, hyperkalemia, left trimal fx.  Discharge Diagnoses:  Principal Problem:   Osteomyelitis of right foot (HCC) Same as above  Discharged Condition: stable  Hospital Course: Patient presented to [facility] on 07/18/21 for elective R transmetatarsal amputation and achilles tendon lengthening by Dr. Toni Arthurs.  The patient tolerated the procedure well without complication.  Intra-op specimen was sent for cultures.  The patient was then admitted to the hospital.  ID was consulted.  The patient was placed on vancomycin and ceftriaxone.  Intra-op cultures began to grow rare gram negative rods.  The patient is to be D/C'd home on 5 days of oral augmentin per ID recommendation.  WOC nurse was consulted for left diabetic foot ulcer and an outpatient treatment plan was established.  The patient worked well with therapy.  He tolerated his stay well without complication.  He is to be D/C'd home on 07/19/21.  Consults: ID and WOC nurse  Significant Diagnostic Studies: microbiology:  Intra-op cultures that demonstrated rare gram negative rods.  Treatments: IV hydration, antibiotics: Ancef, vancomycin, ceftriaxone, and augmentin, analgesia: acetaminophen, Dilaudid, and oxycodone, cardiac meds: lisinopril (Zestril) and labetolol, anticoagulation: plavix, and surgery: as stated above.  Discharge Exam: Blood pressure 138/65, pulse 68, temperature 98.6 F (37 C), temperature source Oral, resp. rate 17, height 5\' 6"  (1.676 m), weight 74.8 kg, SpO2 99 %. General: WDWN patient in NAD. Psych:  Appropriate mood and  affect. Neuro:  A&O x 3, Moving all extremities, sensation intact to light touch HEENT:  EOMs intact Chest:  Even non-labored respirations Skin:  SLS C/D/I, no rashes or lesions Extremities: warm/dry, no visible edema, erythema or echymosis.  No lymphadenopathy. Pulses: Popliteus 2+ MSK:  ROM: TKE, MMT: able to perform quad set  Disposition: Discharge disposition: 01-Home or Self Care       Discharge Instructions     Call MD / Call 911   Complete by: As directed    If you experience chest pain or shortness of breath, CALL 911 and be transported to the hospital emergency room.  If you develope a fever above 101 F, pus (white drainage) or increased drainage or redness at the wound, or calf pain, call your surgeon's office.   Constipation Prevention   Complete by: As directed    Drink plenty of fluids.  Prune juice may be helpful.  You may use a stool softener, such as Colace (over the counter) 100 mg twice a day.  Use MiraLax (over the counter) for constipation as needed.   Diet - low sodium heart healthy   Complete by: As directed    Increase activity slowly as tolerated   Complete by: As directed    Post-operative opioid taper instructions:   Complete by: As directed    POST-OPERATIVE OPIOID TAPER INSTRUCTIONS: It is important to wean off of your opioid medication as soon as possible. If you do not need pain medication after your surgery it is ok to stop day one. Opioids include: Codeine, Hydrocodone(Norco, Vicodin), Oxycodone(Percocet, oxycontin) and hydromorphone amongst others.  Long term and even short term use of opiods can cause: Increased pain response Dependence Constipation Depression Respiratory depression And more.  Withdrawal symptoms can include Flu like symptoms  Nausea, vomiting And more Techniques to manage these symptoms Hydrate well Eat regular healthy meals Stay active Use relaxation techniques(deep breathing, meditating, yoga) Do Not substitute  Alcohol to help with tapering If you have been on opioids for less than two weeks and do not have pain than it is ok to stop all together.  Plan to wean off of opioids This plan should start within one week post op of your joint replacement. Maintain the same interval or time between taking each dose and first decrease the dose.  Cut the total daily intake of opioids by one tablet each day Next start to increase the time between doses. The last dose that should be eliminated is the evening dose.         Allergies as of 07/19/2021       Reactions   Silvadene [silver Sulfadiazine] Rash   Sulfa Antibiotics Rash   Zyvox [linezolid] Rash   Levaquin [levofloxacin]    Other reaction(s): Unknown        Medication List     TAKE these medications    acetaminophen 325 MG tablet Commonly known as: TYLENOL Take 1-2 tablets (325-650 mg total) by mouth every 6 (six) hours as needed for mild pain (pain score 1-3 or temp > 100.5).   amoxicillin-clavulanate 250-62.5 MG/5ML suspension Commonly known as: AUGMENTIN Take 5 mLs (250 mg total) by mouth 2 (two) times daily for 5 days.   aspirin EC 81 MG tablet Take 1 tablet (81 mg total) by mouth daily. Swallow whole.   clopidogrel 75 MG tablet Commonly known as: Plavix Take 1 tablet (75 mg total) by mouth daily.   docusate sodium 100 MG capsule Commonly known as: Colace Take 1 capsule (100 mg total) by mouth 2 (two) times daily. While taking narcotic pain medicine.   glipiZIDE 5 MG tablet Commonly known as: GLUCOTROL Take 5 mg by mouth 2 (two) times daily.   ibuprofen 200 MG tablet Commonly known as: ADVIL Take 600 mg by mouth every 8 (eight) hours as needed (pain.).   lisinopril 10 MG tablet Commonly known as: ZESTRIL Take 2 tablets (20 mg total) by mouth daily. What changed: how much to take   oxyCODONE 5 MG immediate release tablet Commonly known as: Roxicodone Take 1 tablet (5 mg total) by mouth every 4 (four) hours as  needed for up to 3 days for severe pain.   pravastatin 20 MG tablet Commonly known as: PRAVACHOL Take 20 mg by mouth at bedtime.   senna 8.6 MG Tabs tablet Commonly known as: SENOKOT Take 2 tablets (17.2 mg total) by mouth 2 (two) times daily.   Systane Ultra 0.4-0.3 % Soln Generic drug: Polyethyl Glycol-Propyl Glycol Place 1-2 drops into both eyes 3 (three) times daily as needed (dry/irritated eyes.).   tadalafil 20 MG tablet Commonly known as: CIALIS Take 10 mg by mouth in the morning.   VITAMIN D3 PO Take 4,000 Units by mouth in the morning.               Durable Medical Equipment  (From admission, onward)           Start     Ordered   07/19/21 0837  For home use only DME lightweight manual wheelchair with seat cushion  Once       Comments: Patient suffers from R transmetatarsal ampuation and R achilles tendon lengthening which impairs their ability to perform daily activities like bathing in the home.  A walker will not resolve  issue with performing activities of daily living. A wheelchair will allow patient to safely perform daily activities. Patient is not able to propel themselves in the home using a standard weight wheelchair due to general weakness. Patient can self propel in the lightweight wheelchair. Length of need 6 months . Accessories: elevating leg rests (ELRs), wheel locks, extensions and anti-tippers.   07/19/21 0518            Follow-up Information     Toni Arthurs, MD. Schedule an appointment as soon as possible for a visit in 2 week(s).   Specialty: Orthopedic Surgery Contact information: [address] [place] [address] [phone]                 Signed: Lolly Mustache Office:  [phone]

## 2021-07-19 NOTE — Progress Notes (Addendum)
Pt had some questions about his home abx. Alfredo Martinez, PA was called and he answered to all pt's questions. Pt's wound care on the left foot was done. Pt is ready to be discharged. Pt didn't receive his last dose of IV Rocephine. Per PA - "it's ok."

## 2021-07-19 NOTE — Evaluation (Signed)
Physical Therapy Evaluation Patient Details Name: Scott Howard MRN: [id number] DOB: [birth year] Today's Date: 07/19/2021  History of Present Illness  61 y.o. 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 Pt failed conservative treatment and opted for R transmetatarsal ampuation and R achilles tendon lengthening. PMH: DM, HLD, HTN, PVA, hx of L toe amputation.  Clinical Impression  Pt agreeable to physical therapy evaluation/treatment session. Pt performed chair to and from bed transfers x 2 today (once with RW for stand pivot and once without RW for squat pivot). Pt required standby to CGA for safety but likely to progress quickly to modified independent level. Pt provided with green exercise band for HEP. Will initiate stairs and NWB gait next session. Pt currently presents with functional limitations secondary to impairments listed in PT problem list. Pt to benefit from skilled, acute care physical therapy interventions to maximize his independence level and quality of life.       Recommendations for follow up therapy are one component of a multi-disciplinary discharge planning process, led by the attending physician.  Recommendations may be updated based on patient status, additional functional criteria and insurance authorization.  Follow Up Recommendations Home health PT      Assistance Recommended at Discharge Intermittent Supervision/Assistance  Patient can return home with the following  A little help with bathing/dressing/bathroom;Assist for transportation;Help with stairs or ramp for entrance;A little help with walking and/or transfers    Equipment Recommendations Wheelchair (measurements PT);BSC/3in1 (elevating leg rests)  Recommendations for Other Services       Functional Status Assessment Patient has had a recent decline in their functional status and demonstrates the ability to make significant improvements in function in a reasonable and  predictable amount of time.     Precautions / Restrictions Precautions Precautions: Fall Precaution Comments: left diabetic foot ulcer (plantar surface); keep R LE elevated Restrictions Weight Bearing Restrictions: Yes RLE Weight Bearing: Non weight bearing      Mobility  Bed Mobility Overal bed mobility: Modified Independent             General bed mobility comments: Pt up in chair upon arrival    Transfers Overall transfer level: Needs assistance Equipment used: Rolling walker (2 wheels), None Transfers: Sit to/from Stand, Bed to chair/wheelchair/BSC Sit to Stand: Min guard     Squat pivot transfers: Min guard     General transfer comment:  (Pt performed stand pivot transfer with RW from chair <> bed. Pt then performed squat pivot transfer from chair <> bed utilizing chair armrests and bed with cues for hand placements and technique.)    Ambulation/Gait               General Gait Details: Pt not willing to take steps today with RW due to fear of disrupting L foot ulcer. Pt agrees to participate tomorrow.  Stairs            Wheelchair Mobility    Modified Rankin (Stroke Patients Only)       Balance Overall balance assessment: Needs assistance Sitting-balance support: No upper extremity supported, Feet supported Sitting balance-Leahy Scale: Good     Standing balance support: Bilateral upper extremity supported, Single extremity supported, During functional activity Standing balance-Leahy Scale: Poor Standing balance comment: reliant on UE support to avoid WB to R LE  Pertinent Vitals/Pain Pain Assessment Pain Assessment: 0-10 Pain Score: 0-No pain Pain Location: R foot    Home Living Family/patient expects to be discharged to:: Private residence Living Arrangements: Alone Available Help at Discharge: Family;Available PRN/intermittently (Significant other and her family) Type of Home: House Home  Access: Stairs to enter Entrance Stairs-Rails: Right Entrance Stairs-Number of Steps: 3   Home Layout: One level Home Equipment: Rolling Walker (2 wheels);Grab bars - tub/shower;Other (comment);Hand held shower head;Crutches (darco shoes for B feet)      Prior Function Prior Level of Function : Independent/Modified Independent;Driving;Working/employed             Mobility Comments: does not use AD but has utilized crutches and RW when he had WB restrictions in past ADLs Comments: Pt independent with ADL's at baseline     Hand Dominance   Dominant Hand: Right    Extremity/Trunk Assessment   Upper Extremity Assessment Upper Extremity Assessment: Overall WFL for tasks assessed    Lower Extremity Assessment Lower Extremity Assessment:  (Grossly 4/5 in major muscle groups bilaterally)       Communication   Communication: No difficulties  Cognition Arousal/Alertness: Awake/alert Behavior During Therapy: WFL for tasks assessed/performed Overall Cognitive Status: Within Functional Limits for tasks assessed                                          General Comments      Exercises General Exercises - Lower Extremity Long Arc Quad: Both, Seated, 15 reps, Other (comment) (pt also performed 10 reps on each side with green theraband as well as HSC's with green TB x 10) Straight Leg Raises: Both, Supine (12 reps)   Assessment/Plan    PT Assessment Patient needs continued PT services  PT Problem List Decreased strength;Decreased range of motion;Decreased balance;Decreased knowledge of use of DME;Pain;Decreased mobility       PT Treatment Interventions DME instruction;Gait training;Stair training;Functional mobility training;Therapeutic activities;Therapeutic exercise;Balance training;Neuromuscular re-education;Patient/family education;Wheelchair mobility training    PT Goals (Current goals can be found in the Care Plan section)  Acute Rehab PT  Goals Patient Stated Goal: No specific goals stated PT Goal Formulation: With patient Time For Goal Achievement: 08/02/21 Potential to Achieve Goals: Good    Frequency Min 3X/week     Co-evaluation               AM-PAC PT "6 Clicks" Mobility  Outcome Measure Help needed turning from your back to your side while in a flat bed without using bedrails?: None Help needed moving from lying on your back to sitting on the side of a flat bed without using bedrails?: None Help needed moving to and from a bed to a chair (including a wheelchair)?: A Little Help needed standing up from a chair using your arms (e.g., wheelchair or bedside chair)?: A Little Help needed to walk in hospital room?: A Lot Help needed climbing 3-5 steps with a railing? : A Lot 6 Click Score: 18    End of Session Equipment Utilized During Treatment: Gait belt Activity Tolerance: Patient tolerated treatment well Patient left: in chair;with call bell/phone within reach;with family/visitor present Nurse Communication: Mobility status;Weight bearing status PT Visit Diagnosis: Other abnormalities of gait and mobility (R26.89);Pain;Muscle weakness (generalized) (M62.81)    Time: 9417-4081 PT Time Calculation (min) (ACUTE ONLY): 26 min   Charges:   PT Evaluation $PT Eval Low Complexity: 1  Low PT Treatments $Therapeutic Exercise: 8-22 mins        Tana Coast, PT   [employer] 07/19/2021, 12:17 PM

## 2021-07-19 NOTE — Progress Notes (Signed)
Physical Therapy Treatment Patient Details Name: Scott Howard MRN: [id number] DOB: 04-29-1960 Today's Date: 07/19/2021   History of Present Illness 61 y.o. 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 Pt failed conservative treatment and opted for R transmetatarsal ampuation and R achilles tendon lengthening. PMH: DM, HLD, HTN, PVA, hx of L toe amputation.    PT Comments    Pt instructed in and performed gait training and stair training. Pt with good upper body strength and overall performance during interventions. Pt verbalized understanding of HEP.   Recommendations for follow up therapy are one component of a multi-disciplinary discharge planning process, led by the attending physician.  Recommendations may be updated based on patient status, additional functional criteria and insurance authorization.  Follow Up Recommendations  Home health PT     Assistance Recommended at Discharge Intermittent Supervision/Assistance  Patient can return home with the following A little help with bathing/dressing/bathroom;Assist for transportation;Help with stairs or ramp for entrance;A little help with walking and/or transfers   Equipment Recommendations  Wheelchair (measurements PT);BSC/3in1 (elevating leg rests)    Recommendations for Other Services       Precautions / Restrictions Precautions Precautions: Fall Precaution Comments: left diabetic foot ulcer (plantar surface); keep R LE elevated Restrictions Weight Bearing Restrictions: Yes RLE Weight Bearing: Non weight bearing     Mobility  Bed Mobility Overal bed mobility: Modified Independent             General bed mobility comments: Pt up in chair upon arrival    Transfers Overall transfer level: Needs assistance Equipment used: Rolling walker (2 wheels), None Transfers: Sit to/from Stand Sit to Stand: Min guard     Squat pivot transfers: Min guard     General transfer comment: Cues  for hand placements    Ambulation/Gait Ambulation/Gait assistance: Min guard Gait Distance (Feet): 24 Feet Assistive device: Rolling walker (2 wheels)         General Gait Details: Pt ambulated with three point (non WB R LE) pattern. Pt with good stability and fairly good endurance. No LOB occurred.   Stairs Stairs: Yes Stairs assistance: Min guard Stair Management: One rail Right, Forwards, With crutches Number of Stairs: 3 General stair comments: Pt ascended/descended 3 steps with rail on R and single crutch on L. Pt able to maintain non WB restriction adequately.   Wheelchair Mobility    Modified Rankin (Stroke Patients Only)       Balance Overall balance assessment: Needs assistance Sitting-balance support: No upper extremity supported, Feet supported Sitting balance-Leahy Scale: Good     Standing balance support: Bilateral upper extremity supported, Single extremity supported, During functional activity Standing balance-Leahy Scale: Poor Standing balance comment: reliant on UE support to avoid WB to R LE                            Cognition Arousal/Alertness: Awake/alert Behavior During Therapy: WFL for tasks assessed/performed Overall Cognitive Status: Within Functional Limits for tasks assessed                                          Exercises General Exercises - Lower Extremity Long Arc Quad: Both, Seated, 15 reps, Other (comment) (pt also performed 10 reps on each side with green theraband as well as HSC's with green TB x 10) Straight Leg  Raises: Both, Supine (12 reps)    General Comments        Pertinent Vitals/Pain Pain Assessment Pain Assessment: 0-10 Pain Score:  (no c/o) Pain Location: R foot    Home Living                          Prior Function            PT Goals (current goals can now be found in the care plan section) Acute Rehab PT Goals Patient Stated Goal: No specific goals stated PT  Goal Formulation: With patient Time For Goal Achievement: 08/02/21 Potential to Achieve Goals: Good Progress towards PT goals: Progressing toward goals    Frequency    Min 3X/week      PT Plan Current plan remains appropriate    Co-evaluation              AM-PAC PT "6 Clicks" Mobility   Outcome Measure  Help needed turning from your back to your side while in a flat bed without using bedrails?: None Help needed moving from lying on your back to sitting on the side of a flat bed without using bedrails?: None Help needed moving to and from a bed to a chair (including a wheelchair)?: A Little Help needed standing up from a chair using your arms (e.g., wheelchair or bedside chair)?: A Little Help needed to walk in hospital room?: A Little Help needed climbing 3-5 steps with a railing? : A Little 6 Click Score: 20    End of Session Equipment Utilized During Treatment: Gait belt Activity Tolerance: Patient tolerated treatment well Patient left: in chair;with call bell/phone within reach;with family/visitor present Nurse Communication: Mobility status;Weight bearing status PT Visit Diagnosis: Other abnormalities of gait and mobility (R26.89);Pain;Muscle weakness (generalized) (M62.81)     Time: 7893-8101 PT Time Calculation (min) (ACUTE ONLY): 12 min  Charges:  $Therapeutic Activity: 8-22 mins                    Tana Coast, PT    [employer] 07/19/2021, 3:20 PM

## 2021-07-19 NOTE — Progress Notes (Signed)
Pharmacy Antibiotic Note  Scott Howard is a 61 y.o. male admitted on 07/18/2021 with R-foot osteo. Pharmacy has been consulted for Vancomycin + Rocephin dosing.   AM SCr is 1.11 - will continue with q24h Vancomycin dosing for now.   Plan: - Continue Vancomycin 1500 mg IV every 24 hours (eAUC 490, SCr 1.11, Vd 0.72) - Rocephin 2g IV every 24 hours - Will continue to follow renal function, culture results, LOT, and antibiotic de-escalation plans   Height: 5\' 6"  (167.6 cm) Weight: 74.8 kg (165 lb) IBW/kg (Calculated) : 63.8  Temp (24hrs), Avg:98.2 F (36.8 C), Min:97.8 F (36.6 C), Max:98.6 F (37 C)  No results for input(s): "WBC", "CREATININE", "LATICACIDVEN", "VANCOTROUGH", "VANCOPEAK", "VANCORANDOM", "GENTTROUGH", "GENTPEAK", "GENTRANDOM", "TOBRATROUGH", "TOBRAPEAK", "TOBRARND", "AMIKACINPEAK", "AMIKACINTROU", "AMIKACIN" in the last 168 hours.  CrCl cannot be calculated (Patient's most recent lab result is older than the maximum 21 days allowed.).    Allergies  Allergen Reactions   Silvadene [Silver Sulfadiazine] Rash   Sulfa Antibiotics Rash   Zyvox [Linezolid] Rash   Levaquin [Levofloxacin]     Other reaction(s): Unknown    Thank you for allowing pharmacy to be a part of this patient's care.  , PharmD, BCPS Infectious Diseases Clinical Pharmacist 07/19/2021 7:28 AM   **Pharmacist phone directory can now be found on [email or web address] ([email or web address]).  Listed under [facility].

## 2021-07-19 NOTE — Consult Note (Addendum)
WOC Nurse Consult Note: Ortho team is following for assessment and plan of care to right leg post-op wound.    Requested to recommend topical treatment for left foot wound.  Pt had previous amputation to LLE and has obvious deformity of the foot and arch which affects his gain prior to surgery; this has contributed to a full thickness wounds 2X1.5X.2cm to left plantar foot, with dry yellow slightly raised callous edges surrounding.  Girlfriend is at the bedside and states she was a previous wound care nurse and has been applying Aquaphor to the wound to promote moist healing. I do not believe we carry this item in the [facility] formulary, so I will substitute a similar product, and they are in agreement after discussing plan of care. Topical treatment orders provided for bedside nurses to perform as follows to promote moist healing:  Apply Bactroban to left plantar foot Q day, then cover with foam dressing.  Change foam dressing Q 3 days or PRN soiling.  Cover with kerlex. Please re-consult if further assistance is needed.  Thank-you,  Cammie Mcgee MSN, RN, CWOCN, [place], CNS [phone]

## 2021-07-19 NOTE — Consult Note (Signed)
[facility]       Reason for Consult: osteomyelitis    Referring Physician: Dr. Victorino Dike  Principal Problem:   Osteomyelitis of right foot (HCC)    clopidogrel  75 mg Oral Daily   docusate sodium  100 mg Oral BID   glipiZIDE  5 mg Oral BID   insulin aspart  0-15 Units Subcutaneous TID WC   lisinopril  20 mg Oral Daily   pantoprazole  40 mg Oral Daily   pravastatin  20 mg Oral QHS   senna  1 tablet Oral BID    Recommendations: Will stop vancomycin based on culture results Continue with ceftriaxone  Assessment: He has osteomyelitis and now s/p amputation/source control.  Will monitor pathology and culture results and continue antibiotics for now and he can be discharged with 5 days of oral amoxicillin/clavulanate.    Antibiotics: Vancomycin and ceftriaxone.    HPI: Scott Howard is a 61 y.o. male with a history of diabetes and peripheral neuropathy now s/p transmetatarsal amputation of his right foot due to osteomyelitis of the 2nd and 3rd metatarsals.  He developed a non-healing ulcer in the area and managed by Dr. Victorino Dike.  Last Hgb A1c in February 2023 was 6.0.  he has had no recent fever or chills.  No rash or diarrhea. Gram stain with GNRs.  No growth yet on culture.     Review of Systems:  Constitutional: negative for fevers and chills All other systems reviewed and are negative    Past Medical History:  Diagnosis Date   COVID-19    05/2020   Diabetes mellitus without complication (HCC)    Heart murmur    mild AS 05/14/21 echo   Hyperlipidemia    borderline   Hypertension    Peripheral vascular disease (HCC)    Pneumonia    x 1 in 20s    Social History   Tobacco Use   Smoking status: Never   Smokeless tobacco: Never  Vaping Use   Vaping Use: Never used  Substance Use Topics   Alcohol use: Yes    Comment: ocassionally   Drug use: Never    Family History  Problem Relation Age of Onset   Cancer Mother    Cancer Father     Hypertension Brother    Congestive Heart Failure Brother     Allergies  Allergen Reactions   Silvadene [Silver Sulfadiazine] Rash   Sulfa Antibiotics Rash   Zyvox [Linezolid] Rash   Levaquin [Levofloxacin]     Other reaction(s): Unknown    Physical Exam: Constitutional: in no apparent distress  Vitals:   07/19/21 0443 07/19/21 0727  BP: 132/60 (!) 149/68  Pulse:  77  Resp:  17  Temp:  98.4 F (36.9 C)  SpO2:  96%   EYES: anicteric Respiratory: normal respiratory effort Musculoskeletal: right foot wrapped Skin: no rash  Lab Results  Component Value Date   WBC 6.7 07/19/2021   HGB 11.7 (L) 07/19/2021   HCT 34.7 (L) 07/19/2021   MCV 88.5 07/19/2021   PLT 201 07/19/2021    Lab Results  Component Value Date   CREATININE 1.11 07/19/2021   BUN 17 07/19/2021   NA 139 07/19/2021   K 4.7 07/19/2021   CL 107 07/19/2021   CO2 25 07/19/2021    Lab Results  Component Value Date   ALT 18 03/21/2021   AST 19 03/21/2021   ALKPHOS 53 03/21/2021     Microbiology: Recent Results (  from the past 240 hour(s))  Aerobic/Anaerobic Culture w Gram Stain (surgical/deep wound)     Status: None (Preliminary result)   Collection Time: 07/18/21  2:15 PM   Specimen: Wound; Tissue  Result Value Ref Range Status   Specimen Description WOUND  Final   Special Requests FOOT  Final   Gram Stain   Final    FEW WBC PRESENT,BOTH PMN AND MONONUCLEAR RARE GRAM NEGATIVE RODS    Culture   Final    CULTURE REINCUBATED FOR BETTER GROWTH Performed at [facility], [address], [place], [place] [address]    Report Status PENDING  Incomplete    Gardiner Barefoot, MD [email or web address] 07/19/2021, 9:40 AM

## 2021-07-19 NOTE — TOC Transition Note (Signed)
Transition of Care [facility]) - CM/SW Discharge Note   Patient Details  Name: Scott Howard MRN: [id number] Date of Birth: 10-08-1960  [facility]) CM/SW Contact:  Epifanio Lesches, RN Phone Number: 07/19/2021, 2:49 PM   Clinical Narrative:    Patient will DC to: home Anticipated DC date: 07/19/2021 Family notified: yes Transport by: car         - s/p  Right foot transmetatarsal amputation, achilles tendon lengthening (Right: Foot),6/20   Per MD patient ready for DC today . RN, patient,  and patient's family notified of DC. Pt agreeable to home health services. Pt with preference for provider. Referral made with [facility] and accepted.Family and SGO to assist with care once d/c. DME , 3in1 /BSC will be delivered to bedside  prior to d/cby [facility].  Pt without Rx med concerns.  Post hospital f/u noted on AVS.   RNCM will sign off for now as intervention is no longer needed. Please consult [place] again if new needs arise.   Final next level of care: Home w Home Health Services Barriers to Discharge: No Barriers Identified   Patient Goals and CMS Choice     Choice offered to / list presented to : Patient  Discharge Placement                       Discharge Plan and Services   Discharge Planning Services: CM Consult            DME Arranged: 3-N-1 (declined w/c) DME Agency: [facility] Date DME Agency Contacted: 07/19/21 Time DME Agency Contacted: [phone] Representative spoke with at DME Agency: jasmine HH Arranged: PT, OT HH Agency: [facility] Date [facility] Agency Contacted: 07/19/21 Time HH Agency Contacted: [phone] Representative spoke with at [facility] Agency: cory  Social Determinants of Health (SDOH) Interventions     Readmission Risk Interventions     No data to display

## 2021-07-20 LAB — SURGICAL PATHOLOGY

## 2021-07-21 ENCOUNTER — Other Ambulatory Visit: Payer: Self-pay | Admitting: Infectious Diseases

## 2021-07-21 ENCOUNTER — Telehealth: Payer: Self-pay

## 2021-07-21 MED ORDER — CIPROFLOXACIN HCL 500 MG PO TABS: 500.0000 mg | ORAL_TABLET | Freq: Two times a day (BID) | ORAL | 0 refills | Status: DC

## 2021-07-23 LAB — AEROBIC/ANAEROBIC CULTURE W GRAM STAIN (SURGICAL/DEEP WOUND)

## 2021-07-26 ENCOUNTER — Telehealth (INDEPENDENT_AMBULATORY_CARE_PROVIDER_SITE_OTHER): Payer: 59 | Admitting: Internal Medicine

## 2021-07-26 ENCOUNTER — Other Ambulatory Visit: Payer: Self-pay

## 2021-07-26 ENCOUNTER — Encounter: Payer: Self-pay | Admitting: Internal Medicine

## 2021-07-26 DIAGNOSIS — M86271 Subacute osteomyelitis, right ankle and foot: Secondary | ICD-10-CM

## 2021-07-26 NOTE — Progress Notes (Unsigned)
   Subjective:    Patient ID: Scott Howard, male    DOB: [birth year], 61 y.o.   MRN: [id number]  I connected with  Scott Howard on 07/26/21 by a video enabled telemedicine application and verified that I am speaking with the correct person using two identifiers.   I discussed the limitations of evaluation and management by telemedicine. The patient expressed understanding and agreed to proceed.  Location: Patient - home Physician - clinic  Duration of visit:  40 minutes  HPI He is seen for follow up of right transmetatarsal osteomyelitis s/p amputation by Dr. Victorino Dike.   Follow up is requested due to concerns from the patient for his antibiotic plan.  He is concerned that his osteomyelitis needs to be treated medically with prolonged antibiotics.  He has a history of diabetes and peripheral neuropathy and developed a non-healing ulcer of his right forefoot.  An MRI was done by Dr. Victorino Dike c/w osteomyelitis of the 2nd and 3rd metatarsal heads and he underwent transmetatarsal amputation on 07/18/21.  Gross margins were clear and the bone was sent to pathology to confirm good margins, which it did.  He was placed on Augmenting for 5 days then changed to cipro with culture growth of Pseudomonas.  He is mainly concerned that he is not being adequately treated for osteomyelitis and needs prolonged antibiotics.  He is concerned that he has remaining areas of osteomyelitis.     Review of Systems  Constitutional:  Negative for chills and fever.  Gastrointestinal:  Negative for diarrhea and nausea.  Skin:  Negative for rash.       Objective:   Physical Exam Neurological:     Mental Status: He is alert.  Psychiatric:        Mood and Affect: Mood normal.           Assessment & Plan:

## 2021-07-27 ENCOUNTER — Encounter: Payer: Self-pay | Admitting: Internal Medicine

## 2021-07-27 ENCOUNTER — Other Ambulatory Visit: Payer: Self-pay | Admitting: Internal Medicine

## 2021-07-27 ENCOUNTER — Telehealth: Payer: Self-pay

## 2021-07-27 MED ORDER — CIPROFLOXACIN HCL 750 MG PO TABS: 750.0000 mg | ORAL_TABLET | Freq: Two times a day (BID) | ORAL | 0 refills | Status: DC

## 2021-07-27 NOTE — Assessment & Plan Note (Signed)
I had a prolonged discussion with the patient regarding treatments for osteomyelitis - medical treatment with prolonged antibiotics vs surgical treatment with amputation.  I discussed that in his case, he has undergone surgical cure of the infection by removal of infected bone.  This was confirmed by pathology review of the amputated sample and good margins.  I also reviewed the MRI report with him and discussed the case with Dr. Victorino Dike to confirm all the findings.  This was discussed with the patient in two conversations, one during the appointment time and a follow up after my discussion with Dr. Victorino Dike.  The patient and I did decide to continue with cipro for 2 weeks to assure clearance of any remaining tissue infection, though is not likely.  All questions answered.    I have personally spent 40 minutes involved in face-to-face and non-face-to-face activities for this patient on the day of the visit + an additional 15 minutes in same day follow up. Professional time spent includes the following activities: Preparing to see the patient (review of tests), Obtaining and/or reviewing separately obtained history (admission/discharge record), Performing a medically appropriate examination and/or evaluation , Ordering medications/tests/procedures, referring and communicating with other health care professionals, Documenting clinical information in the EMR, Independently interpreting results (not separately reported), Communicating results to the patient/family/caregiver, Counseling and educating the patient/family/caregiver and Care coordination (not separately reported).

## 2021-07-27 NOTE — Telephone Encounter (Signed)
Patient called office today to follow up on virtual appointment from 6/28. Would like to know if provider will be sending in higher dose of Cipro to his pharmacy. States that it was mentioned during visit that previous prescription was a lower dose.  Spoke with provider who states prescription  Scott Howard, RMA

## 2021-11-13 ENCOUNTER — Ambulatory Visit (INDEPENDENT_AMBULATORY_CARE_PROVIDER_SITE_OTHER): Payer: 59 | Admitting: Vascular Surgery

## 2021-11-13 ENCOUNTER — Encounter (INDEPENDENT_AMBULATORY_CARE_PROVIDER_SITE_OTHER): Payer: 59

## 2021-11-19 ENCOUNTER — Encounter: Payer: Self-pay | Admitting: Emergency Medicine

## 2021-11-19 ENCOUNTER — Emergency Department: Payer: 59

## 2021-11-19 DIAGNOSIS — I10 Essential (primary) hypertension: Secondary | ICD-10-CM | POA: Diagnosis not present

## 2021-11-19 DIAGNOSIS — D72829 Elevated white blood cell count, unspecified: Secondary | ICD-10-CM | POA: Diagnosis not present

## 2021-11-19 DIAGNOSIS — N200 Calculus of kidney: Secondary | ICD-10-CM | POA: Diagnosis not present

## 2021-11-19 DIAGNOSIS — N2889 Other specified disorders of kidney and ureter: Secondary | ICD-10-CM | POA: Diagnosis not present

## 2021-11-19 DIAGNOSIS — R109 Unspecified abdominal pain: Secondary | ICD-10-CM | POA: Diagnosis present

## 2021-11-19 DIAGNOSIS — E119 Type 2 diabetes mellitus without complications: Secondary | ICD-10-CM | POA: Diagnosis not present

## 2021-11-19 LAB — CBC WITH DIFFERENTIAL/PLATELET
Abs Immature Granulocytes: 0.03 10*3/uL (ref 0.00–0.07)
Basophils Absolute: 0 10*3/uL (ref 0.0–0.1)
Basophils Relative: 0 %
Eosinophils Absolute: 0 10*3/uL (ref 0.0–0.5)
Eosinophils Relative: 0 %
HCT: 49.4 % (ref 39.0–52.0)
Hemoglobin: 16.2 g/dL (ref 13.0–17.0)
Immature Granulocytes: 0 %
Lymphocytes Relative: 9 %
Lymphs Abs: 1.3 10*3/uL (ref 0.7–4.0)
MCH: 29.3 pg (ref 26.0–34.0)
MCHC: 32.8 g/dL (ref 30.0–36.0)
MCV: 89.5 fL (ref 80.0–100.0)
Monocytes Absolute: 1 10*3/uL (ref 0.1–1.0)
Monocytes Relative: 7 %
Neutro Abs: 11.2 10*3/uL — ABNORMAL HIGH (ref 1.7–7.7)
Neutrophils Relative %: 84 %
Platelets: 175 10*3/uL (ref 150–400)
RBC: 5.52 MIL/uL (ref 4.22–5.81)
RDW: 12.1 % (ref 11.5–15.5)
WBC: 13.5 10*3/uL — ABNORMAL HIGH (ref 4.0–10.5)
nRBC: 0 % (ref 0.0–0.2)

## 2021-11-19 LAB — COMPREHENSIVE METABOLIC PANEL
ALT: 20 U/L (ref 0–44)
AST: 23 U/L (ref 15–41)
Albumin: 4 g/dL (ref 3.5–5.0)
Alkaline Phosphatase: 62 U/L (ref 38–126)
Anion gap: 9 (ref 5–15)
BUN: 24 mg/dL — ABNORMAL HIGH (ref 8–23)
CO2: 21 mmol/L — ABNORMAL LOW (ref 22–32)
Calcium: 9 mg/dL (ref 8.9–10.3)
Chloride: 106 mmol/L (ref 98–111)
Creatinine, Ser: 1.32 mg/dL — ABNORMAL HIGH (ref 0.61–1.24)
GFR, Estimated: >60 mL/min (ref >60)
Glucose, Bld: 123 mg/dL — ABNORMAL HIGH (ref 70–99)
Potassium: 4.2 mmol/L (ref 3.5–5.1)
Sodium: 136 mmol/L (ref 135–145)
Total Bilirubin: 1.8 mg/dL — ABNORMAL HIGH (ref 0.3–1.2)
Total Protein: 7.6 g/dL (ref 6.5–8.1)

## 2021-11-19 MED ORDER — OXYCODONE-ACETAMINOPHEN 5-325 MG PO TABS
1.0000 | ORAL_TABLET | Freq: Once | ORAL | Status: AC
  Administered 2021-11-19: 1 via ORAL
  Filled 2021-11-19: qty 1

## 2021-11-19 MED ORDER — ONDANSETRON 4 MG PO TBDP
4.0000 mg | ORAL_TABLET | Freq: Once | ORAL | Status: AC
  Administered 2021-11-19: 4 mg via ORAL
  Filled 2021-11-19: qty 1

## 2021-11-19 NOTE — ED Triage Notes (Signed)
Pt presents via POV with complaints of left sided flank pan for the last 3 days. He endorses N/V for the last 2 days. Hx of kidney stone 15 years ago. Denies CP or SOB.

## 2021-11-20 ENCOUNTER — Emergency Department
Admission: EM | Admit: 2021-11-20 | Discharge: 2021-11-20 | Disposition: A | Discharge status: Home / Self Care | Payer: 59 | Attending: Emergency Medicine | Admitting: Emergency Medicine

## 2021-11-20 DIAGNOSIS — N200 Calculus of kidney: Secondary | ICD-10-CM

## 2021-11-20 LAB — URINALYSIS, ROUTINE W REFLEX MICROSCOPIC
Bacteria, UA: NONE SEEN
Bilirubin Urine: NEGATIVE
Glucose, UA: 50 mg/dL — AB
Ketones, ur: 5 mg/dL — AB
Leukocytes,Ua: NEGATIVE
Nitrite: NEGATIVE
Protein, ur: >=300 mg/dL — AB
Specific Gravity, Urine: 1.025 (ref 1.005–1.030)
pH: 5 (ref 5.0–8.0)

## 2021-11-20 MED ORDER — OXYCODONE-ACETAMINOPHEN 5-325 MG PO TABS: 1.0000 | ORAL_TABLET | ORAL | 0 refills | Status: DC | PRN

## 2021-11-20 MED ORDER — ONDANSETRON 4 MG PO TBDP: 4.0000 mg | ORAL_TABLET | Freq: Three times a day (TID) | ORAL | 0 refills | Status: DC | PRN

## 2021-11-20 MED ORDER — KETOROLAC TROMETHAMINE 60 MG/2ML IM SOLN
60.0000 mg | Freq: Once | INTRAMUSCULAR | Status: AC
  Administered 2021-11-20: 60 mg via INTRAMUSCULAR
  Filled 2021-11-20: qty 2

## 2021-11-20 MED ORDER — TAMSULOSIN HCL 0.4 MG PO CAPS: 0.4000 mg | ORAL_CAPSULE | Freq: Every day | ORAL | 0 refills | Status: DC

## 2021-11-20 MED ORDER — ONDANSETRON 4 MG PO TBDP
4.0000 mg | ORAL_TABLET | Freq: Once | ORAL | Status: AC
Start: 2021-11-20 — End: 2021-11-20
  Administered 2021-11-20: 4 mg via ORAL
  Filled 2021-11-20: qty 1

## 2021-11-20 MED ORDER — KETOROLAC TROMETHAMINE 10 MG PO TABS: 10.0000 mg | ORAL_TABLET | Freq: Four times a day (QID) | ORAL | 0 refills | Status: DC | PRN

## 2021-11-20 NOTE — ED Notes (Signed)
ED Provider at bedside. 

## 2021-11-20 NOTE — ED Provider Notes (Signed)
[facility] Provider Note    Event Date/Time   First MD Initiated Contact with Patient 11/20/21 0120     (approximate)  History   Chief Complaint: Flank Pain  HPI  Scott Howard is a 61 y.o. male with a past medical history of diabetes, hypertension, hyperlipidemia, 1 prior kidney stone, presents emergency department for left flank pain.  According to the patient over the past 3 days or so he has been experiencing pain in his left flank, states the pain is worsened over the past 24 hours along with nausea and occasional episodes of vomiting.  Patient states he had 1 prior kidney stone many years ago to which this feels somewhat identical but worse.  Denies any dysuria.  Denies any fever.  Currently states his pain is a 2/10 after receiving oxycodone in the waiting room.  Physical Exam   Triage Vital Signs: ED Triage Vitals  Enc Vitals Group     BP 11/19/21 2315 (!) 176/96     Pulse Rate 11/19/21 2315 82     Resp 11/19/21 2315 20     Temp 11/19/21 2315 98.3 F (36.8 C)     Temp Source 11/19/21 2315 Oral     SpO2 11/19/21 2315 100 %     Weight 11/19/21 2321 165 lb (74.8 kg)     Height 11/19/21 2321 5\' 6"  (1.676 m)     Head Circumference --      Peak Flow --      Pain Score 11/20/21 0150 5     Pain Loc --      Pain Edu? --      Excl. in [place]? --     Most recent vital signs: Vitals:   11/19/21 2315 11/20/21 0201  BP: (!) 176/96 (!) 189/92  Pulse: 82 80  Resp: 20 20  Temp: 98.3 F (36.8 C)   SpO2: 100% 99%    General: Awake, no distress.  CV:  Good peripheral perfusion.  Regular rate and rhythm  Resp:  Normal effort.  Equal breath sounds bilaterally.  Abd:  No distention.  Soft, nontender.  No rebound or guarding.   ED Results / Procedures / Treatments   RADIOLOGY  Have reviewed and interpreted the CT images appears to have a proximal left ureteral stone on my evaluation. Radiology shows a proximal 4 x 3 x 3 mm left ureteral stone.  Also  shows a right renal mass that is slightly larger from 2022.  I have discussed these findings with the patient as well as the importance of following up with his doctor for an MRI.   MEDICATIONS ORDERED IN ED: Medications  oxyCODONE-acetaminophen (PERCOCET/ROXICET) 5-325 MG per tablet 1 tablet (1 tablet Oral Given 11/19/21 2319)  ondansetron (ZOFRAN-ODT) disintegrating tablet 4 mg (4 mg Oral Given 11/19/21 2319)  ketorolac (TORADOL) injection 60 mg (60 mg Intramuscular Given 11/20/21 0156)  ondansetron (ZOFRAN-ODT) disintegrating tablet 4 mg (4 mg Oral Given 11/20/21 0202)     IMPRESSION / MDM / ASSESSMENT AND PLAN / ED COURSE  I reviewed the triage vital signs and the nursing notes.  Patient's presentation is most consistent with acute presentation with potential threat to life or bodily function.  Patient presents emergency department for left flank pain over the past 3 days worse over the past 24 hours.  CT scan shows a 3 x 4 mm ureteral stone.  Remainder the patient's work-up is normal with a reassuring CBC reassuring chemistry with just slight renal  insufficiency.  Patient states pain was down to 2 but is now creeping back up we will dose an IM injection of Toradol.  We will check a urinalysis to ensure there is no infection.  I had a discussion with the patient and wife regarding the renal mass seen on the right kidney and that as it has increased in size it is imperative that they follow-up with her PCP within the next week or 2 for evaluation and to obtain further imaging such as an MRI.  They are agreeable to plan.  CBC shows mild leukocytosis at 13,000, chemistry reassuring.  Urinalysis shows blood but no signs of infection.  We will discharge the patient with urology follow-up.  Patient agreeable to plan of care.  FINAL CLINICAL IMPRESSION(S) / ED DIAGNOSES   Kidney stone Renal mass  Rx / DC Orders   Toradol Percocet Zofran Flomax Urology follow-up PCP follow-up  Note:   This document was prepared using Dragon voice recognition software and may include unintentional dictation errors.   Harvest Dark, MD 11/20/21 [phone]

## 2021-11-20 NOTE — Discharge Instructions (Addendum)
Your work-up today is consistent with a kidney stone.  Please call the number provided for urology to arrange a follow-up appointment.  As we discussed please take your pain medication as needed but only as prescribed.  Do not drink alcohol or drive while taking your oxycodone.  Return to the emergency department for any significant increase in pain or any fever.  As we discussed your work-up today also shows enlargement of a right renal mass.  Please follow-up with your doctor to arrange MRI imaging of this mass as soon as possible.  We have also referred you to an oncologist if needed.  Return to the emergency department for any symptom concerning to yourself.

## 2021-11-21 LAB — URINE CULTURE: Culture: NO GROWTH

## 2021-11-23 ENCOUNTER — Other Ambulatory Visit (INDEPENDENT_AMBULATORY_CARE_PROVIDER_SITE_OTHER): Payer: Self-pay | Admitting: Vascular Surgery

## 2021-11-23 DIAGNOSIS — I739 Peripheral vascular disease, unspecified: Secondary | ICD-10-CM

## 2021-11-24 ENCOUNTER — Encounter (INDEPENDENT_AMBULATORY_CARE_PROVIDER_SITE_OTHER): Payer: Self-pay | Admitting: Nurse Practitioner

## 2021-11-24 ENCOUNTER — Ambulatory Visit (INDEPENDENT_AMBULATORY_CARE_PROVIDER_SITE_OTHER): Payer: 59 | Admitting: Nurse Practitioner

## 2021-11-24 ENCOUNTER — Ambulatory Visit (INDEPENDENT_AMBULATORY_CARE_PROVIDER_SITE_OTHER): Payer: 59

## 2021-11-24 VITALS — BP 153/75 | HR 67 | Resp 18 | Ht 66.0 in | Wt 185.6 lb

## 2021-11-24 DIAGNOSIS — E1169 Type 2 diabetes mellitus with other specified complication: Secondary | ICD-10-CM | POA: Diagnosis not present

## 2021-11-24 DIAGNOSIS — I10 Essential (primary) hypertension: Secondary | ICD-10-CM | POA: Diagnosis not present

## 2021-11-24 DIAGNOSIS — E785 Hyperlipidemia, unspecified: Secondary | ICD-10-CM

## 2021-11-24 DIAGNOSIS — Z9889 Other specified postprocedural states: Secondary | ICD-10-CM

## 2021-11-24 DIAGNOSIS — I739 Peripheral vascular disease, unspecified: Secondary | ICD-10-CM

## 2021-11-27 ENCOUNTER — Encounter (INDEPENDENT_AMBULATORY_CARE_PROVIDER_SITE_OTHER): Payer: Self-pay

## 2021-12-03 ENCOUNTER — Encounter (INDEPENDENT_AMBULATORY_CARE_PROVIDER_SITE_OTHER): Payer: Self-pay | Admitting: Nurse Practitioner

## 2021-12-03 NOTE — Progress Notes (Signed)
Subjective:    Patient ID: Scott Howard, male    DOB: 27-Nov-1960, 61 y.o.   MRN: [id number] No chief complaint on file.   The patient returns to the office for followup and review of the noninvasive studies.   There have been no interval changes in lower extremity symptoms. No interval shortening of the patient's claudication distance or development of rest pain symptoms. No new ulcers or wounds have occurred since the last visit.  There have been no significant changes to the patient's overall health care.  The patient denies amaurosis fugax or recent TIA symptoms. There are no documented recent neurological changes noted. There is no history of DVT, PE or superficial thrombophlebitis. The patient denies recent episodes of angina or shortness of breath.   ABI Rt=1.20 and Lt=1.20  (previous ABI's Rt=1.00 and Lt=1.43) Duplex ultrasound of the bilateral lower extremities reveals biphasic tibial artery waveforms.     Review of Systems  All other systems reviewed and are negative.      Objective:   Physical Exam Vitals reviewed.  HENT:     Head: Normocephalic.  Cardiovascular:     Rate and Rhythm: Normal rate.     Pulses:          Dorsalis pedis pulses are detected w/ Doppler on the right side and detected w/ Doppler on the left side.       Posterior tibial pulses are detected w/ Doppler on the right side and detected w/ Doppler on the left side.  Pulmonary:     Effort: Pulmonary effort is normal.  Skin:    General: Skin is warm and dry.  Neurological:     Mental Status: He is alert and oriented to person, place, and time.  Psychiatric:        Mood and Affect: Mood normal.        Behavior: Behavior normal.        Thought Content: Thought content normal.        Judgment: Judgment normal.     BP (!) 153/75 (BP Location: Right Arm)   Pulse 67   Resp 18   Ht 5\' 6"  (1.676 m)   Wt 185 lb 9.6 oz (84.2 kg)   BMI 29.96 kg/m   Past Medical History:  Diagnosis Date    COVID-19    05/2020   Diabetes mellitus without complication (HCC)    Heart murmur    mild AS 05/14/21 echo   Hyperlipidemia    borderline   Hypertension    Peripheral vascular disease (HCC)    Pneumonia    x 1 in 20s    Social History   Socioeconomic History   Marital status: Divorced    Spouse name: Not on file   Number of children: Not on file   Years of education: Not on file   Highest education level: Not on file  Occupational History   Not on file  Tobacco Use   Smoking status: Never   Smokeless tobacco: Never  Vaping Use   Vaping Use: Never used  Substance and Sexual Activity   Alcohol use: Yes    Comment: ocassionally   Drug use: Never   Sexual activity: Not on file  Other Topics Concern   Not on file  Social History Narrative   Not on file   Social Determinants of Health   Financial Resource Strain: Not on file  Food Insecurity: Not on file  Transportation Needs: Not on file  Physical Activity: Not  on file  Stress: Not on file  Social Connections: Not on file  Intimate Partner Violence: Not on file    Past Surgical History:  Procedure Laterality Date   AMPUTATION Right 08/11/2020   Procedure: Right 1st and 5th Ray Resection;  Surgeon: Linus Galas, DPM;  Location: [facility];  Service: Podiatry;  Laterality: Right;   AMPUTATION Right 07/18/2021   Procedure: Right foot transmetatarsal amputation, achilles tendon lengthening;  Surgeon: Toni Arthurs, MD;  Location: [facility];  Service: Orthopedics;  Laterality: Right;  regional block  hold antiobiotics for cultures   AMPUTATION TOE Left 05/29/2020   Procedure: 4th and 5th TOE AMPUTATION WITH PARTIAL RAY RESECTION;  Surgeon: Linus Galas, DPM;  Location: [facility];  Service: Podiatry;  Laterality: Left;   AMPUTATION TOE Right 01/27/2021   Procedure: AMPUTATION TOE - 2ND MPJ;  Surgeon: Linus Galas, DPM;  Location: [facility];  Service: Podiatry;  Laterality: Right;   CATARACT EXTRACTION W/PHACO Right 09/19/2020    Procedure: CATARACT EXTRACTION PHACO AND INTRAOCULAR LENS PLACEMENT (IOC) RIGHT INTRVITREAL TRIESCENCE DIABETIC;  Surgeon: Nevada Crane, MD;  Location: [facility];  Service: Ophthalmology;  Laterality: Right;  Diabetic - oral meds 4.55 00:36.1   CATARACT EXTRACTION W/PHACO Left 10/10/2020   Procedure: CATARACT EXTRACTION PHACO AND INTRAOCULAR LENS PLACEMENT (IOC) LEFT INTRAVITREAL TRIESCENCE DIABETIC 4.86 00:38.7;  Surgeon: Nevada Crane, MD;  Location: [facility];  Service: Ophthalmology;  Laterality: Left;  Diabetic - oral meds   HAND SURGERY Left    LOWER EXTREMITY ANGIOGRAPHY Left 05/27/2020   Procedure: Lower Extremity Angiography;  Surgeon: Renford Dills, MD;  Location: [facility];  Service: Cardiovascular;  Laterality: Left;   LOWER EXTREMITY ANGIOGRAPHY Right 05/31/2020   Procedure: Lower Extremity Angiography;  Surgeon: Renford Dills, MD;  Location: [facility];  Service: Cardiovascular;  Laterality: Right;   LOWER EXTREMITY ANGIOGRAPHY Right 08/10/2020   Procedure: Lower Extremity Angiography;  Surgeon: Renford Dills, MD;  Location: [facility];  Service: Cardiovascular;  Laterality: Right;   ORIF ANKLE FRACTURE Left 03/21/2021   Procedure: OPEN REDUCTION INTERNAL FIXATION (ORIF) ANKLE FRACTURE;  Surgeon: Toni Arthurs, MD;  Location: [facility];  Service: Orthopedics;  Laterality: Left;   TONSILLECTOMY AND ADENOIDECTOMY      Family History  Problem Relation Age of Onset   Cancer Mother    Cancer Father    Hypertension Brother    Congestive Heart Failure Brother     Allergies  Allergen Reactions   Silvadene [Silver Sulfadiazine] Rash   Sulfa Antibiotics Rash   Zyvox [Linezolid] Rash   Levofloxacin Rash    Other reaction(s): Unknown       Latest Ref Rng & Units 11/19/2021   11:22 PM 07/19/2021    7:19 AM 06/20/2021    8:14 AM  CBC  WBC 4.0 - 10.5 K/uL 13.5  6.7  6.4   Hemoglobin 13.0 - 17.0 g/dL 37.1  06.2   69.4   Hematocrit 39.0 - 52.0 % 49.4  34.7  42.4   Platelets 150 - 400 K/uL 175  201  191       CMP     Component Value Date/Time   NA 136 11/19/2021 2322   K 4.2 11/19/2021 2322   CL 106 11/19/2021 2322   CO2 21 (L) 11/19/2021 2322   GLUCOSE 123 (H) 11/19/2021 2322   BUN 24 (H) 11/19/2021 2322   CREATININE 1.32 (H) 11/19/2021 2322   CALCIUM 9.0 11/19/2021 2322  PROT 7.6 11/19/2021 2322   ALBUMIN 4.0 11/19/2021 2322   AST 23 11/19/2021 2322   ALT 20 11/19/2021 2322   ALKPHOS 62 11/19/2021 2322   BILITOT 1.8 (H) 11/19/2021 2322   GFRNONAA >60 11/19/2021 2322     VAS US ABI WITH/WO TBI  Result Date: 11/27/2021  LOWER EXTREMITY DOPPLER STUDY Patient Name:  Scott Howard  Date of Exam:   11/24/2021 Medical Rec #: [id number]       Accession #:    [id number] Date of Birth: [birth year]       Patient Gender: M Patient Age:   [age] years Exam Location:  [facility] Procedure:      VAS US ABI WITH/WO TBI Referring Phys: --------------------------------------------------------------------------------  Indications: Peripheral artery disease, and slow healing wound on rt 2nd toe.  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 Comparison Study: 05/15/2021 Performing Technologist: Salvadore Farbererry Knight RVT  Examination Guidelines: A complete evaluation includes at minimum, Doppler waveform signals and systolic blood pressure reading at the level of bilateral brachial, anterior tibial, and posterior tibial arteries, when vessel segments are accessible. Bilateral testing is considered an integral part of a complete examination. Photoelectric Plethysmograph (PPG) waveforms and toe systolic pressure readings are included as required and additional duplex testing as needed. Limited examinations for reoccurring indications may be performed as noted.  ABI Findings: +---------+------------------+-----+--------+---------+ Right    Rt Pressure (mmHg)IndexWaveformComment    +---------+------------------+-----+--------+---------+ Brachial 155                                      +---------+------------------+-----+--------+---------+ ATA      186               1.20 biphasic          +---------+------------------+-----+--------+---------+ PTA                             biphasicNonComp   +---------+------------------+-----+--------+---------+ Great Toe                               amputated +---------+------------------+-----+--------+---------+ +---------+------------------+-----+--------+-------+ Left     Lt Pressure (mmHg)IndexWaveformComment +---------+------------------+-----+--------+-------+ Brachial 150                                    +---------+------------------+-----+--------+-------+ ATA      186               1.20 biphasic        +---------+------------------+-----+--------+-------+ PTA      185               1.19 biphasic        +---------+------------------+-----+--------+-------+ Great Toe129               0.83 Normal          +---------+------------------+-----+--------+-------+ +-------+-----------+-----------+------------+------------+ ABI/TBIToday's ABIToday's TBIPrevious ABIPrevious TBI +-------+-----------+-----------+------------+------------+ Right  1.20       amp        1.00                     +-------+-----------+-----------+------------+------------+ Left   1.20       .83        1.43        not done     +-------+-----------+-----------+------------+------------+  Summary: Right: Resting right ankle-brachial index is within normal range. Forefoot amputation. Left: Resting left ankle-brachial index is within normal range. The left toe-brachial index is normal. *See table(s) above for measurements and observations.  Electronically signed by Hortencia Pilar MD on 11/27/2021 at 5:25:45 PM.    Final        Assessment & Plan:   1. Peripheral arterial disease with history of  revascularization (HCC)  Recommend:  The patient has evidence of atherosclerosis of the lower extremities with claudication.  The patient does not voice lifestyle limiting changes at this point in time.  Noninvasive studies do not suggest clinically significant change.  No invasive studies, angiography or surgery at this time The patient should continue walking and begin a more formal exercise program.  The patient should continue antiplatelet therapy and aggressive treatment of the lipid abnormalities  No changes in the patient's medications at this time  Continued surveillance is indicated as atherosclerosis is likely to progress with time.    The patient will continue follow up with noninvasive studies as ordered.  Recommend:  The patient has evidence of atherosclerosis of the lower extremities with claudication.  The patient does not voice lifestyle limiting changes at this point in time.  Noninvasive studies do not suggest clinically significant change.  No invasive studies, angiography or surgery at this time The patient should continue walking and begin a more formal exercise program.  The patient should continue antiplatelet therapy and aggressive treatment of the lipid abnormalities  No changes in the patient's medications at this time  Continued surveillance is indicated as atherosclerosis is likely to progress with time.    The patient will continue follow up with noninvasive studies as ordered.   Patient will follow-up in 1 year - VAS [place] ABI WITH/WO TBI  2. Type 2 diabetes mellitus with hyperlipidemia (HCC) Continue hypoglycemic medications as already ordered, these medications have been reviewed and there are no changes at this time.  Hgb A1C to be monitored as already arranged by primary service  3. Essential hypertension Continue antihypertensive medications as already ordered, these medications have been reviewed and there are no changes at this  time.   Current Outpatient Medications on File Prior to Visit  Medication Sig Dispense Refill   acetaminophen (TYLENOL) 325 MG tablet Take 1-2 tablets (325-650 mg total) by mouth every 6 (six) hours as needed for mild pain (pain score 1-3 or temp > 100.5).     aspirin EC 81 MG EC tablet Take 1 tablet (81 mg total) by mouth daily. Swallow whole. 30 tablet 11   Cholecalciferol (VITAMIN D3 PO) Take 4,000 Units by mouth in the morning.     clopidogrel (PLAVIX) 75 MG tablet Take 1 tablet (75 mg total) by mouth daily. 90 tablet 3   EPINEPHrine 0.3 mg/0.3 mL IJ SOAJ injection INJECT 0.3 ML (0.3 MG TOTAL) INTO THE MUSCLE ONCE AS NEEDED FOR ANAPHYLAXIS FOR UP TO 1 DOSE     glipiZIDE (GLUCOTROL) 5 MG tablet Take 5 mg by mouth 2 (two) times daily.     glipiZIDE (GLUCOTROL) 5 MG tablet Take by mouth.     ibuprofen (ADVIL) 200 MG tablet Take 600 mg by mouth every 8 (eight) hours as needed (pain.).     lisinopril (ZESTRIL) 10 MG tablet Take 2 tablets (20 mg total) by mouth daily. (Patient taking differently: Take 5 mg by mouth daily.) 60 tablet 0   Multiple Vitamin (MULTIVITAMIN) tablet Take 1 tablet by mouth daily.  mupirocin ointment (BACTROBAN) 2 % SMARTSIG:1 Application Topical 2-3 Times Daily     Polyethyl Glycol-Propyl Glycol (SYSTANE ULTRA) 0.4-0.3 % SOLN Place 1-2 drops into both eyes 3 (three) times daily as needed (dry/irritated eyes.).     pravastatin (PRAVACHOL) 20 MG tablet Take 20 mg by mouth at bedtime.     tadalafil (CIALIS) 20 MG tablet Take 10 mg by mouth in the morning.     No current facility-administered medications on file prior to visit.    There are no Patient Instructions on file for this visit. No follow-ups on file.   Georgiana Spinner, NP

## 2021-12-07 ENCOUNTER — Telehealth (INDEPENDENT_AMBULATORY_CARE_PROVIDER_SITE_OTHER): Payer: Self-pay

## 2021-12-07 NOTE — Telephone Encounter (Signed)
Pt needs to have some extractions on Friday November 17th and the dentist office needs to know how long he needs to stop the Plavix before and after procedure.  The dental office does not have a fax right now so April if you call Missy back [phone]  she can email the medical release form.

## 2021-12-08 NOTE — Telephone Encounter (Signed)
He can stop 5 days before

## 2021-12-08 NOTE — Telephone Encounter (Signed)
Missy was giving verbal orders for anticoagulation clearance

## 2022-07-13 IMAGING — DX DG WRIST COMPLETE 3+V*R*
1 series · 4 of 4 positions shown · non-contrast
Comparison: None.

CLINICAL DATA: Fall.  Laceration

EXAM:
RIGHT WRIST - COMPLETE 3+ VIEW

[Series 1: wrist · 0.14mm/px · 4 of 4 slices shown]
[im 1/4]
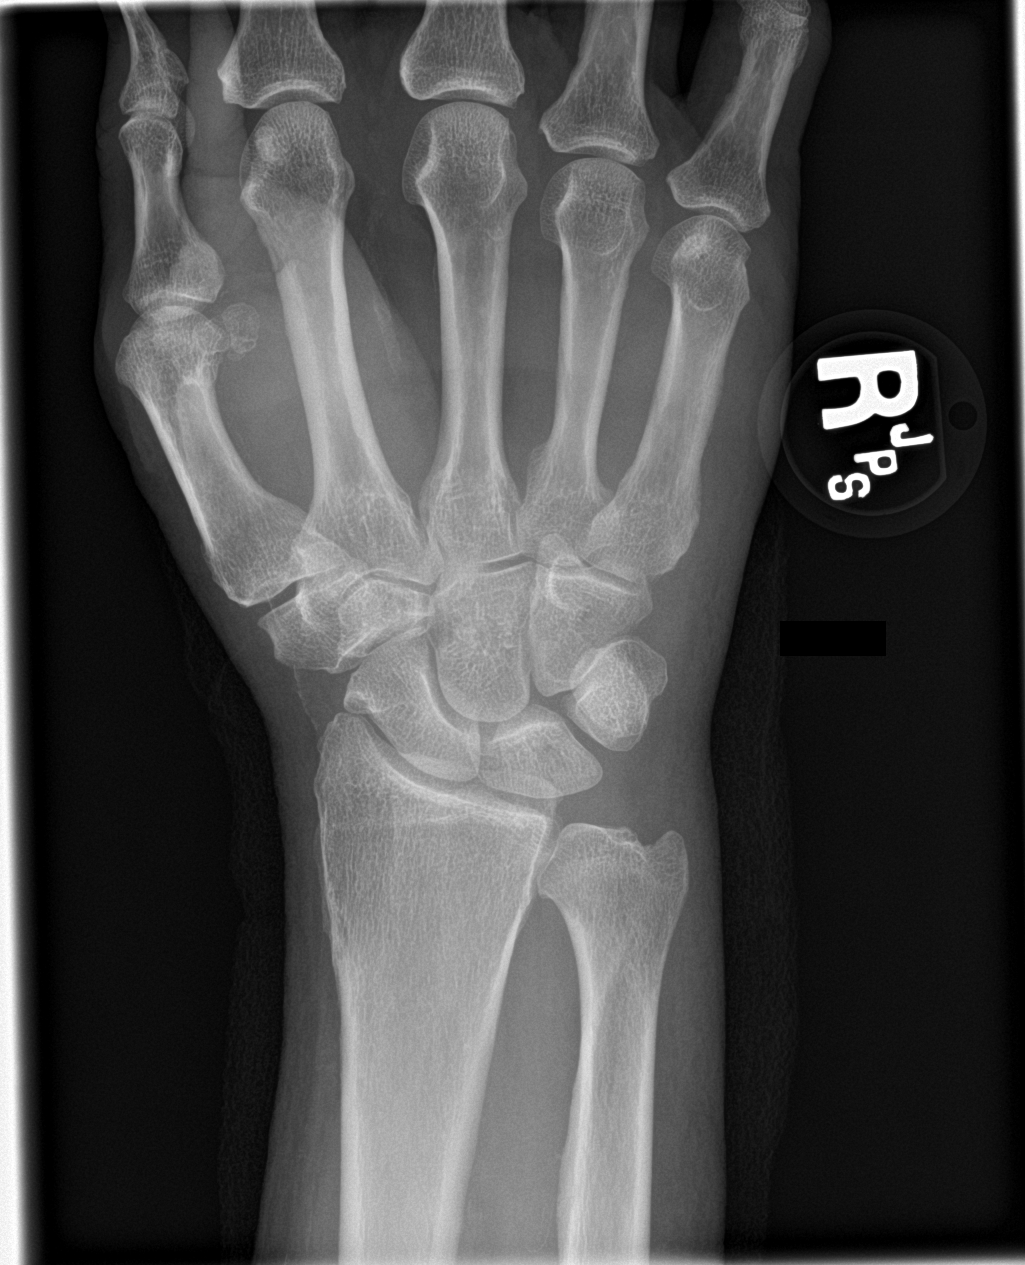
[im 2/4]
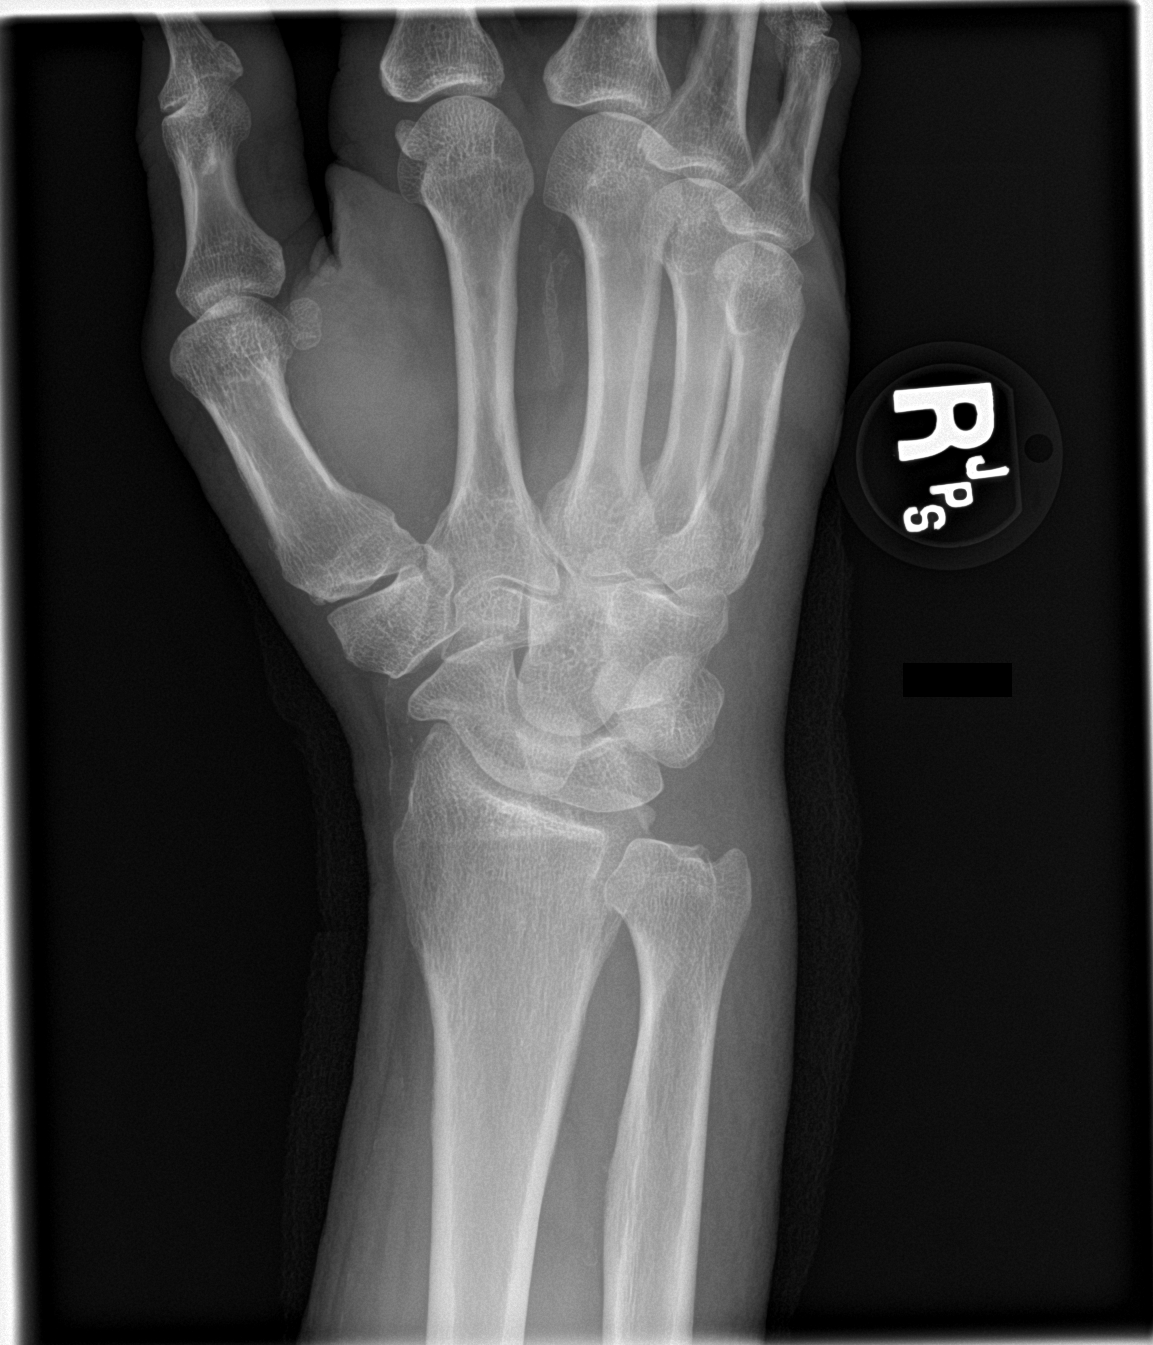
[im 3/4]
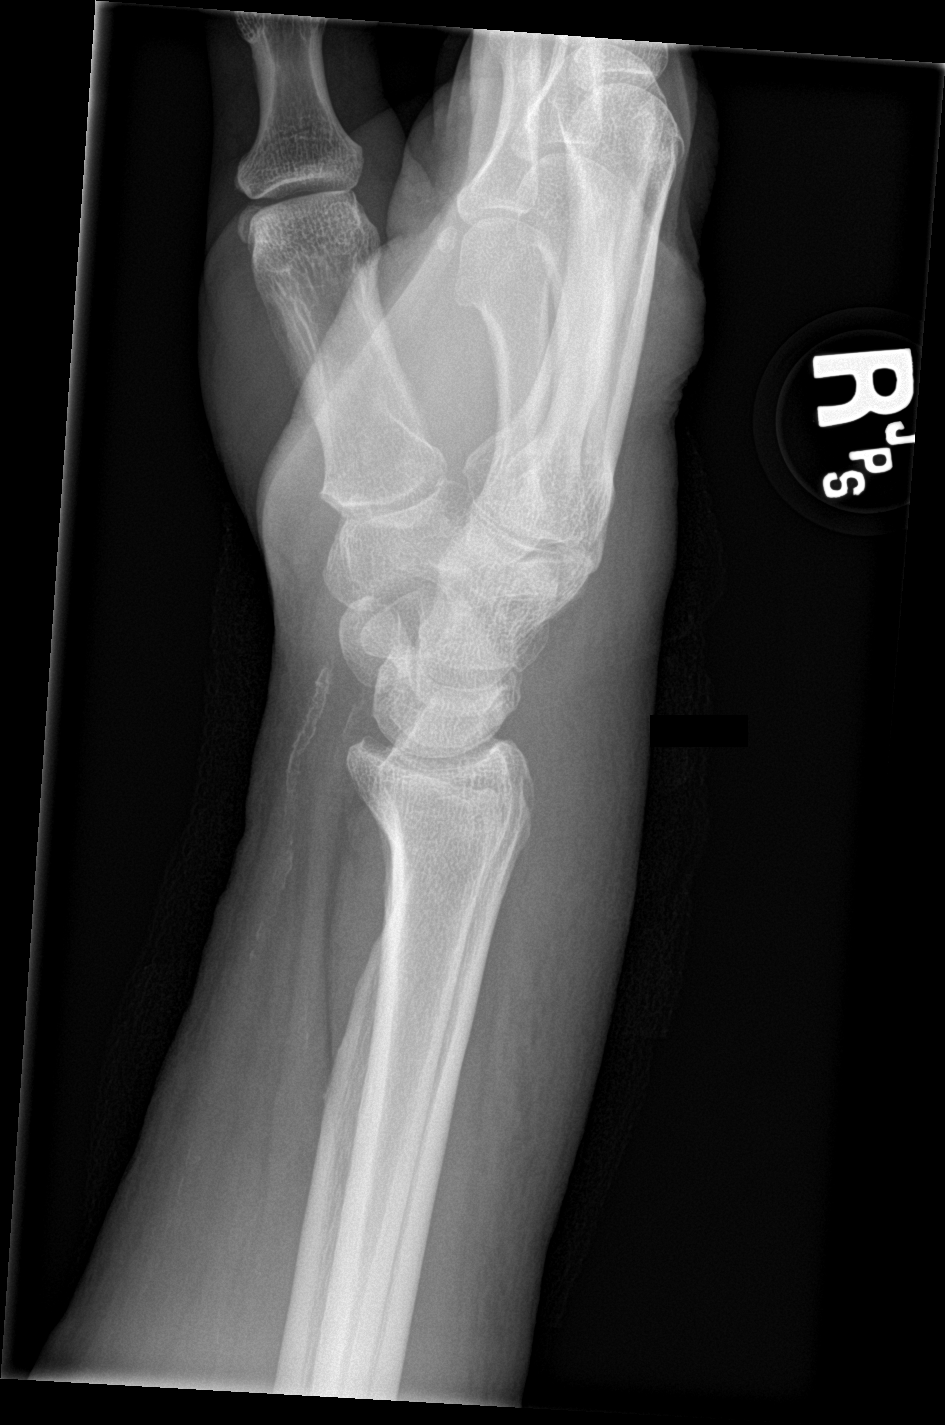
[im 4/4]
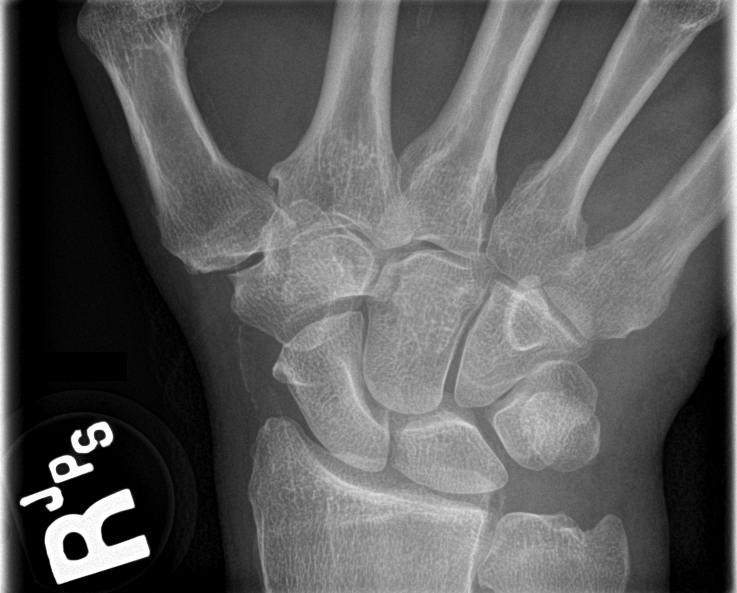

[4 of 4 positions shown; findings below may reference images not displayed]

FINDINGS: There is no evidence of fracture or dislocation. There is no
evidence of arthropathy or other focal bone abnormality. Soft
tissues are unremarkable. Mild arterial calcification.
IMPRESSION: Negative.

## 2022-07-13 IMAGING — CT CT ANKLE*L* W/O CM
3 series · 14 of 35 positions shown, 17 images · non-contrast
Comparison: Same day radiograph

CLINICAL DATA: Ankle trauma, fracture, xray done (Age >= 5y)

EXAM:
CT OF THE LEFT ANKLE WITHOUT CONTRAST
TECHNIQUE: Multidetector CT imaging of the left ankle was performed according
to the standard protocol. Multiplanar CT image reconstructions were
also generated.
RADIATION DOSE REDUCTION: This exam was performed according to the
departmental dose-optimization program which includes automated
exposure control, adjustment of the mA and/or kV according to
patient size and/or use of iterative reconstruction technique.

[Series 4: lower ext 1.5 st · axial · 0.37mm/px · z∈[-1680,-1540]mm · 6 of 122 slices shown, 8 images]
[im 19/122  soft-tissue]
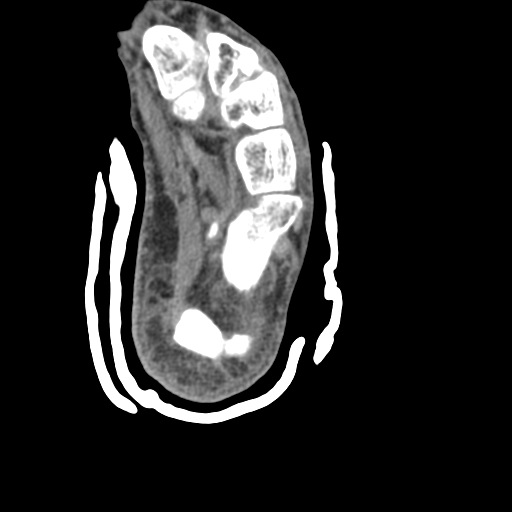
[im 19/122  bone]
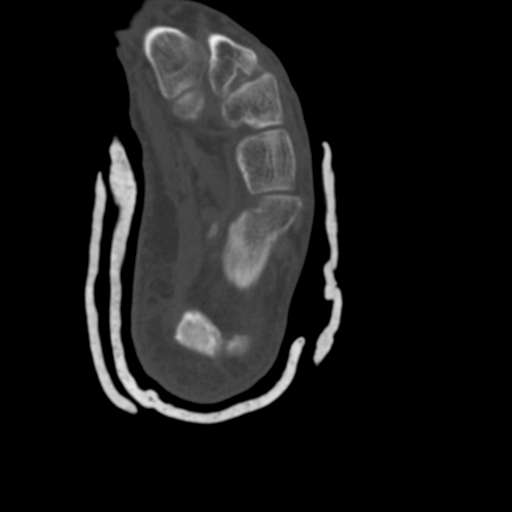
[im 38/122  bone]
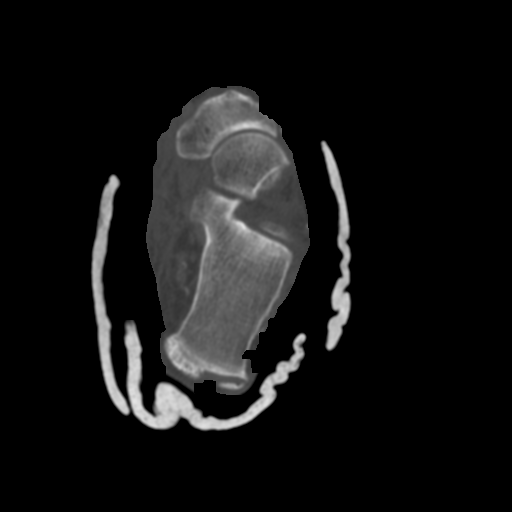
[im 56/122  bone]
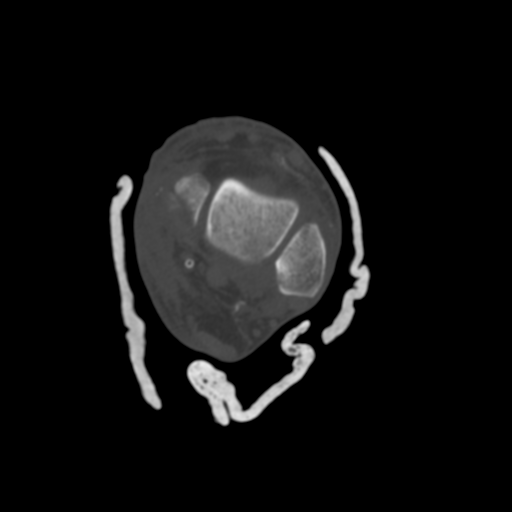
[im 75/122  bone]
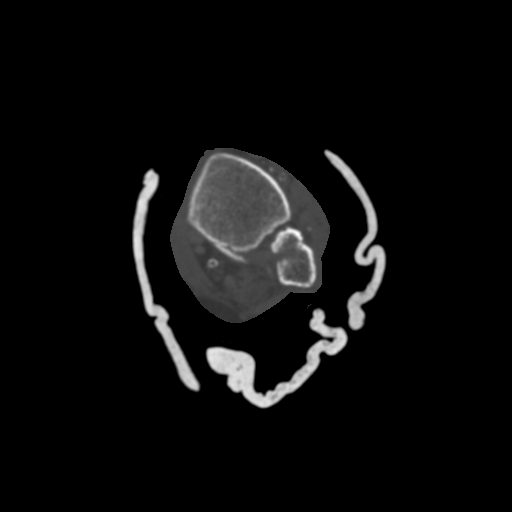
[im 94/122  soft-tissue]
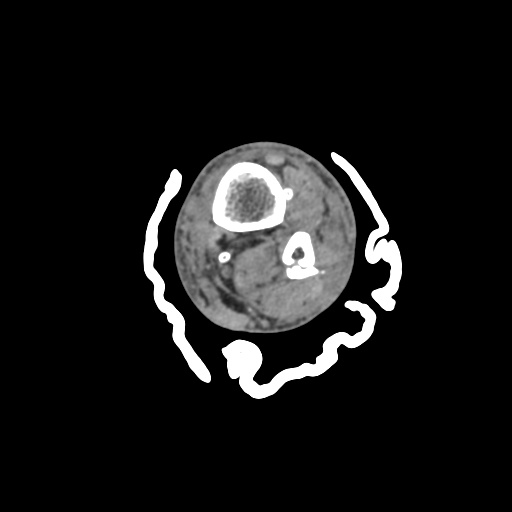
[im 94/122  bone]
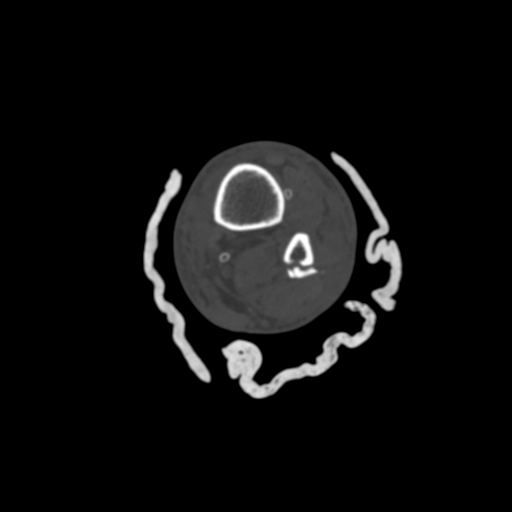
[im 112/122  bone]
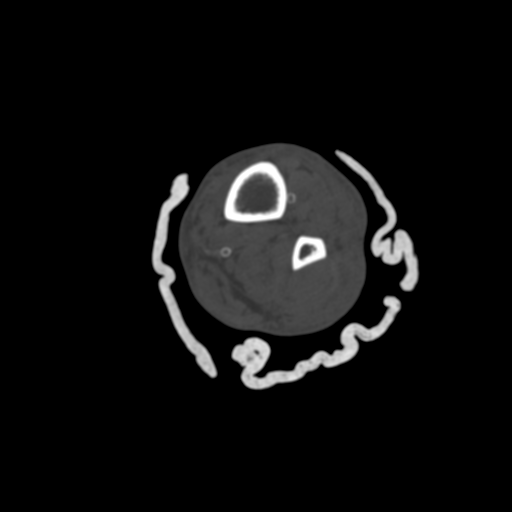

[Series 9: lower ext cor st · coronal · 0.31mm/px · 3 of 117 slices shown]
[im 24/117  bone]
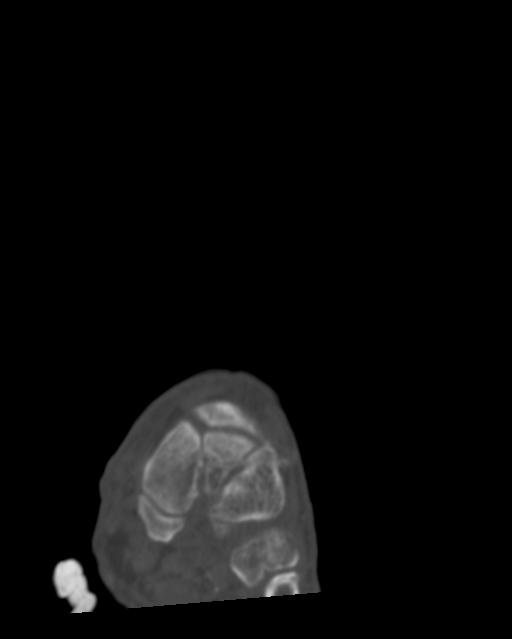
[im 47/117  bone]
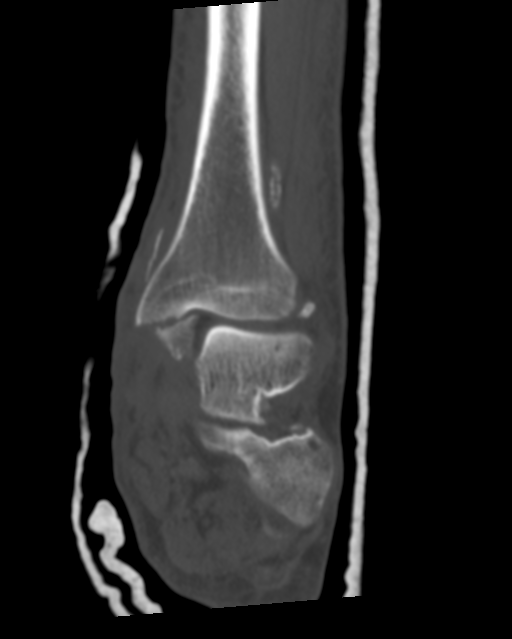
[im 70/117  bone]
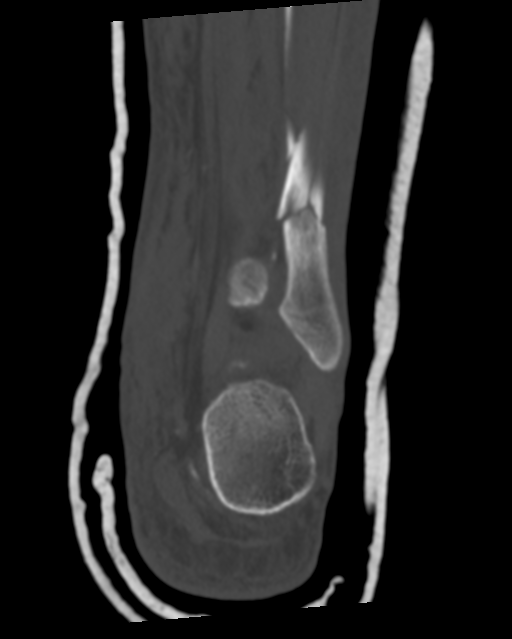

[Series 10: lower ext sag st · sagittal · 0.32mm/px · 5 of 94 slices shown, 6 images]
[im 32/94  bone]
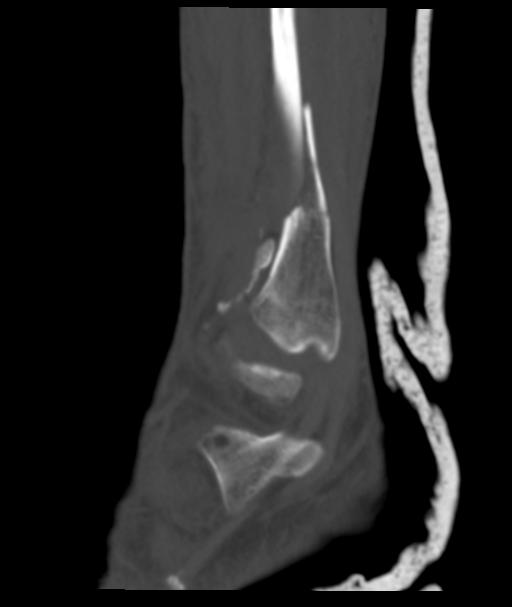
[im 39/94  bone]
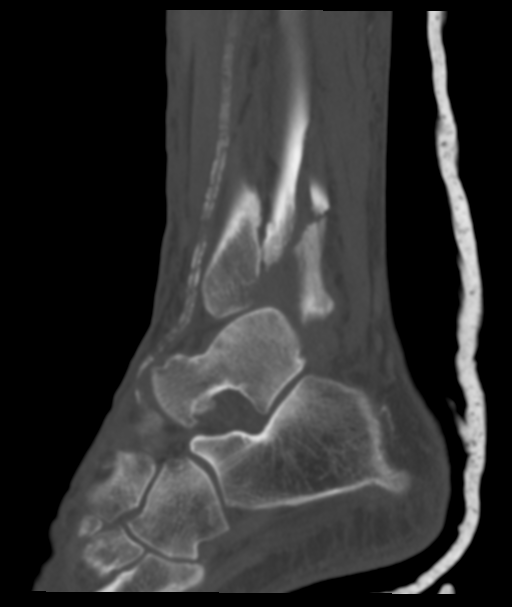
[im 47/94  soft-tissue]
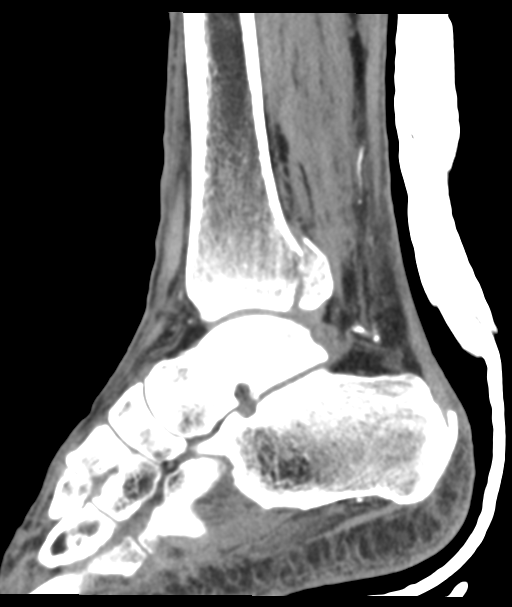
[im 47/94  bone]
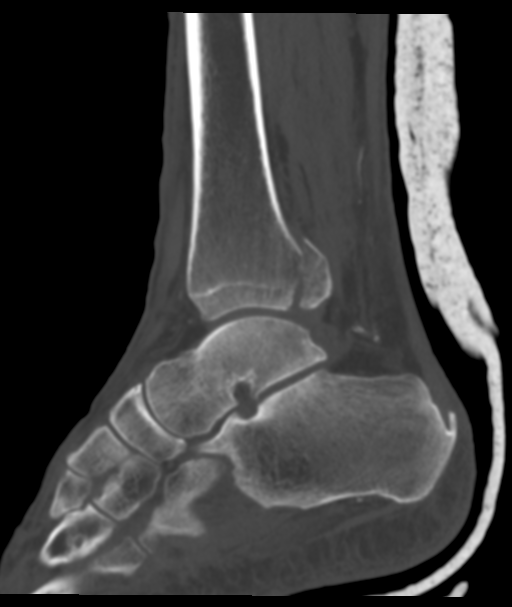
[im 55/94  bone]
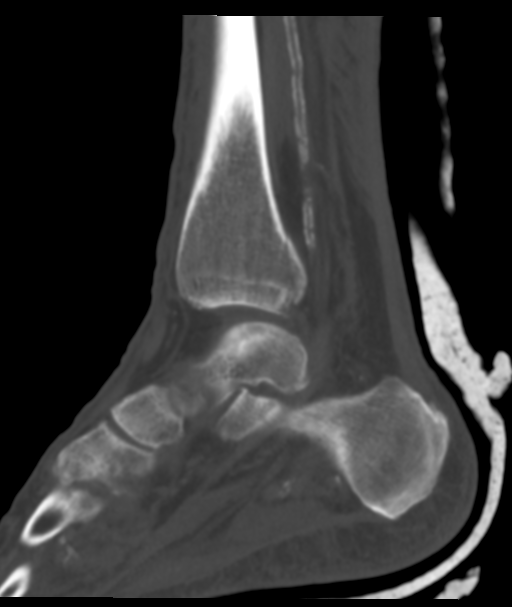
[im 63/94  bone]
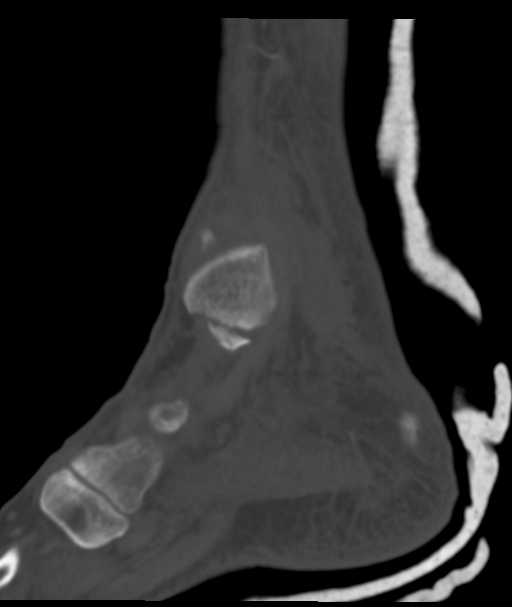

[14 of 35 positions shown; findings below may reference images not displayed]

FINDINGS: Bones/Joint/Cartilage

Trimalleolar fracture subluxation of the left ankle. Obliquely
oriented, comminuted distal fibular fracture at the level of the
syndesmosis with angulation and up to 5 mm displacement. There is a
medial malleolar fracture with up to 6 mm displacement. There is a
posterior malleolar fracture with up to 3 mm posterior displacement
an which involves approximately 15% of the articular surface. There
is an anterolateral distal tibial fracture with 5 mm displacement
consistent with an anterior tibiofibular ligament avulsion. There is
lateral subluxation of the talus with respect to the tibia.

Ligaments

Suboptimally assessed by CT.

Muscles and Tendons

No evidence of tendon entrapment. The posterior tibial tendon and
flexor digitorum longus tendon abuts the medial margin of the
posterior malleolar fracture.

Soft tissues

Extensive ankle soft tissue swelling.
IMPRESSION: Trimalleolar fracture subluxation of the left ankle as described
above. Of note the posterior malleolar fracture involves
approximately 15% of the articular surface. Additional displaced
anterolateral distal tibial fracture consistent with anterior
tibiofibular ligament avulsion.

No evidence of tendon entrapment, though the posterior tibial and
flexor digitorum longus tendons abut the medial margin of the
posterior malleolar fracture.

## 2022-11-21 ENCOUNTER — Other Ambulatory Visit (INDEPENDENT_AMBULATORY_CARE_PROVIDER_SITE_OTHER): Payer: Self-pay | Admitting: Nurse Practitioner

## 2022-11-21 DIAGNOSIS — I739 Peripheral vascular disease, unspecified: Secondary | ICD-10-CM

## 2022-11-23 ENCOUNTER — Encounter (INDEPENDENT_AMBULATORY_CARE_PROVIDER_SITE_OTHER): Payer: Self-pay | Admitting: Nurse Practitioner

## 2022-11-23 ENCOUNTER — Ambulatory Visit (INDEPENDENT_AMBULATORY_CARE_PROVIDER_SITE_OTHER): Payer: 59 | Admitting: Nurse Practitioner

## 2022-11-23 ENCOUNTER — Ambulatory Visit (INDEPENDENT_AMBULATORY_CARE_PROVIDER_SITE_OTHER): Payer: 59

## 2022-11-23 VITALS — BP 203/84 | HR 63 | Resp 18 | Ht 66.0 in | Wt 188.4 lb

## 2022-11-23 DIAGNOSIS — Z9889 Other specified postprocedural states: Secondary | ICD-10-CM

## 2022-11-23 DIAGNOSIS — E11621 Type 2 diabetes mellitus with foot ulcer: Secondary | ICD-10-CM | POA: Diagnosis not present

## 2022-11-23 DIAGNOSIS — E785 Hyperlipidemia, unspecified: Secondary | ICD-10-CM

## 2022-11-23 DIAGNOSIS — I739 Peripheral vascular disease, unspecified: Secondary | ICD-10-CM

## 2022-11-23 DIAGNOSIS — E1169 Type 2 diabetes mellitus with other specified complication: Secondary | ICD-10-CM

## 2022-11-23 DIAGNOSIS — L97412 Non-pressure chronic ulcer of right heel and midfoot with fat layer exposed: Secondary | ICD-10-CM

## 2022-11-24 NOTE — Progress Notes (Signed)
Subjective:    Patient ID: Scott Howard, male    DOB: August 15, 1960, 62 y.o.   MRN: [id number] Chief Complaint  Patient presents with   Follow-up    1 year abi + follow up     The patient returns to the office for followup regarding atherosclerotic changes of the lower extremities and review of the noninvasive studies.   There have been no interval changes in lower extremity symptoms. No interval shortening of the patient's claudication distance or development of rest pain symptoms.  The patient has had a wound occur since his last visit.  He is working with podiatry and is healing well.  It was caused by friction and rubbing against his residual limb.  There have been no significant changes to the patient's overall health care.  The patient denies amaurosis fugax or recent TIA symptoms. There are no documented recent neurological changes noted. There is no history of DVT, PE or superficial thrombophlebitis. The patient denies recent episodes of angina or shortness of breath.   ABIs are noncompressible bilaterally with a TBI of 0.80 on the left.  Good triphasic tibial artery waveforms bilaterally.      Review of Systems  Skin:  Positive for wound.       Objective:   Physical Exam Vitals reviewed.  HENT:     Head: Normocephalic.  Cardiovascular:     Rate and Rhythm: Normal rate.  Pulmonary:     Effort: Pulmonary effort is normal.  Skin:    General: Skin is warm and dry.  Neurological:     Mental Status: He is alert and oriented to person, place, and time.  Psychiatric:        Mood and Affect: Mood normal.        Behavior: Behavior normal.        Thought Content: Thought content normal.        Judgment: Judgment normal.     BP (!) 203/84 (BP Location: Left Arm)   Pulse 63   Resp 18   Ht 5\' 6"  (1.676 m)   Wt 188 lb 6.4 oz (85.5 kg)   BMI 30.41 kg/m   Past Medical History:  Diagnosis Date   COVID-19    05/2020   Diabetes mellitus without complication (HCC)     Heart murmur    mild AS 05/14/21 echo   Hyperlipidemia    borderline   Hypertension    Peripheral vascular disease (HCC)    Pneumonia    x 1 in 20s    Social History   Socioeconomic History   Marital status: Divorced    Spouse name: Not on file   Number of children: Not on file   Years of education: Not on file   Highest education level: Not on file  Occupational History   Not on file  Tobacco Use   Smoking status: Never   Smokeless tobacco: Never  Vaping Use   Vaping status: Never Used  Substance and Sexual Activity   Alcohol use: Yes    Comment: ocassionally   Drug use: Never   Sexual activity: Not on file  Other Topics Concern   Not on file  Social History Narrative   Not on file   Social Determinants of Health   Financial Resource Strain: Not on file  Food Insecurity: Not on file  Transportation Needs: Not on file  Physical Activity: Not on file  Stress: Not on file  Social Connections: Not on file  Intimate Partner  Violence: Not on file    Past Surgical History:  Procedure Laterality Date   AMPUTATION Right 08/11/2020   Procedure: Right 1st and 5th Ray Resection;  Surgeon: Linus Galas, DPM;  Location: [facility];  Service: Podiatry;  Laterality: Right;   AMPUTATION Right 07/18/2021   Procedure: Right foot transmetatarsal amputation, achilles tendon lengthening;  Surgeon: Toni Arthurs, MD;  Location: [facility] OR;  Service: Orthopedics;  Laterality: Right;  regional block  hold antiobiotics for cultures   AMPUTATION TOE Left 05/29/2020   Procedure: 4th and 5th TOE AMPUTATION WITH PARTIAL RAY RESECTION;  Surgeon: Linus Galas, DPM;  Location: [facility];  Service: Podiatry;  Laterality: Left;   AMPUTATION TOE Right 01/27/2021   Procedure: AMPUTATION TOE - 2ND MPJ;  Surgeon: Linus Galas, DPM;  Location: [facility];  Service: Podiatry;  Laterality: Right;   CATARACT EXTRACTION W/PHACO Right 09/19/2020   Procedure: CATARACT EXTRACTION PHACO AND INTRAOCULAR LENS PLACEMENT (IOC)  RIGHT INTRVITREAL TRIESCENCE DIABETIC;  Surgeon: Nevada Crane, MD;  Location: [facility];  Service: Ophthalmology;  Laterality: Right;  Diabetic - oral meds 4.55 00:36.1   CATARACT EXTRACTION W/PHACO Left 10/10/2020   Procedure: CATARACT EXTRACTION PHACO AND INTRAOCULAR LENS PLACEMENT (IOC) LEFT INTRAVITREAL TRIESCENCE DIABETIC 4.86 00:38.7;  Surgeon: Nevada Crane, MD;  Location: [facility];  Service: Ophthalmology;  Laterality: Left;  Diabetic - oral meds   HAND SURGERY Left    LOWER EXTREMITY ANGIOGRAPHY Left 05/27/2020   Procedure: Lower Extremity Angiography;  Surgeon: Renford Dills, MD;  Location: [facility];  Service: Cardiovascular;  Laterality: Left;   LOWER EXTREMITY ANGIOGRAPHY Right 05/31/2020   Procedure: Lower Extremity Angiography;  Surgeon: Renford Dills, MD;  Location: [facility];  Service: Cardiovascular;  Laterality: Right;   LOWER EXTREMITY ANGIOGRAPHY Right 08/10/2020   Procedure: Lower Extremity Angiography;  Surgeon: Renford Dills, MD;  Location: [facility];  Service: Cardiovascular;  Laterality: Right;   ORIF ANKLE FRACTURE Left 03/21/2021   Procedure: OPEN REDUCTION INTERNAL FIXATION (ORIF) ANKLE FRACTURE;  Surgeon: Toni Arthurs, MD;  Location: [facility] OR;  Service: Orthopedics;  Laterality: Left;   TONSILLECTOMY AND ADENOIDECTOMY      Family History  Problem Relation Age of Onset   Cancer Mother    Cancer Father    Hypertension Brother    Congestive Heart Failure Brother     Allergies  Allergen Reactions   Silvadene [Silver Sulfadiazine] Rash   Sulfa Antibiotics Rash   Zyvox [Linezolid] Rash   Levofloxacin Rash    Other reaction(s): Unknown       Latest Ref Rng & Units 11/19/2021   11:22 PM 07/19/2021    7:19 AM 06/20/2021    8:14 AM  CBC  WBC 4.0 - 10.5 K/uL 13.5  6.7  6.4   Hemoglobin 13.0 - 17.0 g/dL 16.1  09.6  04.5   Hematocrit 39.0 - 52.0 % 49.4  34.7  42.4   Platelets 150 - 400  K/uL 175  201  191       CMP     Component Value Date/Time   NA 136 11/19/2021 2322   K 4.2 11/19/2021 2322   CL 106 11/19/2021 2322   CO2 21 (L) 11/19/2021 2322   GLUCOSE 123 (H) 11/19/2021 2322   BUN 24 (H) 11/19/2021 2322   CREATININE 1.32 (H) 11/19/2021 2322   CALCIUM 9.0 11/19/2021 2322   PROT 7.6 11/19/2021 2322   ALBUMIN 4.0 11/19/2021 2322   AST 23  11/19/2021 2322   ALT 20 11/19/2021 2322   ALKPHOS 62 11/19/2021 2322   BILITOT 1.8 (H) 11/19/2021 2322   GFRNONAA >60 11/19/2021 2322     No results found.     Assessment & Plan:   1. Peripheral arterial disease with history of revascularization (HCC)  Recommend:  The patient has evidence of atherosclerosis of the lower extremities with no claudication.  The patient does not voice lifestyle limiting changes at this point in time.  Noninvasive studies do not suggest clinically significant change.  No invasive studies, angiography or surgery at this time The patient should continue walking and begin a more formal exercise program.  The patient should continue antiplatelet therapy and aggressive treatment of the lipid abnormalities  No changes in the patient's medications at this time  Continued surveillance is indicated as atherosclerosis is likely to progress with time.    The patient will continue follow up with noninvasive studies as ordered.   2. Type 2 diabetes mellitus with hyperlipidemia (HCC) Continue hypoglycemic medications as already ordered, these medications have been reviewed and there are no changes at this time.  Hgb A1C to be monitored as already arranged by primary service  3. Diabetic ulcer of right midfoot associated with type 2 diabetes mellitus, with fat layer exposed (HCC) Currently working with podiatry to heal.  It is slow healing well.  His studies indicate that he should have adequate perfusion to move forward.   Current Outpatient Medications on File Prior to Visit  Medication Sig  Dispense Refill   aspirin EC 81 MG EC tablet Take 1 tablet (81 mg total) by mouth daily. Swallow whole. 30 tablet 11   clopidogrel (PLAVIX) 75 MG tablet Take 1 tablet (75 mg total) by mouth daily. 90 tablet 3   EPINEPHrine 0.3 mg/0.3 mL IJ SOAJ injection INJECT 0.3 ML (0.3 MG TOTAL) INTO THE MUSCLE ONCE AS NEEDED FOR ANAPHYLAXIS FOR UP TO 1 DOSE     glipiZIDE (GLUCOTROL) 5 MG tablet Take 5 mg by mouth 2 (two) times daily.     glipiZIDE (GLUCOTROL) 5 MG tablet Take by mouth.     lisinopril (ZESTRIL) 10 MG tablet Take 2 tablets (20 mg total) by mouth daily. 60 tablet 0   Multiple Vitamin (MULTIVITAMIN) tablet Take 1 tablet by mouth daily.     mupirocin ointment (BACTROBAN) 2 % SMARTSIG:1 Application Topical 2-3 Times Daily     Polyethyl Glycol-Propyl Glycol (SYSTANE ULTRA) 0.4-0.3 % SOLN Place 1-2 drops into both eyes 3 (three) times daily as needed (dry/irritated eyes.).     pravastatin (PRAVACHOL) 20 MG tablet Take 20 mg by mouth at bedtime.     tadalafil (CIALIS) 20 MG tablet Take 10 mg by mouth in the morning.     acetaminophen (TYLENOL) 325 MG tablet Take 1-2 tablets (325-650 mg total) by mouth every 6 (six) hours as needed for mild pain (pain score 1-3 or temp > 100.5). (Patient not taking: Reported on 11/23/2022)     Cholecalciferol (VITAMIN D3 PO) Take 4,000 Units by mouth in the morning. (Patient not taking: Reported on 11/23/2022)     No current facility-administered medications on file prior to visit.    There are no Patient Instructions on file for this visit. No follow-ups on file.   Georgiana Spinner, NP

## 2022-11-24 NOTE — Progress Notes (Incomplete)
Subjective:    Patient ID: Scott Howard, male    DOB: July 31, 1960, 62 y.o.   MRN: [id number] Chief Complaint  Patient presents with  . Follow-up    1 year abi + follow up     HPI  Review of Systems     Objective:   Physical Exam  BP (!) 203/84 (BP Location: Left Arm)   Pulse 63   Resp 18   Ht 5\' 6"  (1.676 m)   Wt 188 lb 6.4 oz (85.5 kg)   BMI 30.41 kg/m   Past Medical History:  Diagnosis Date  . COVID-19    05/2020  . Diabetes mellitus without complication (HCC)   . Heart murmur    mild AS 05/14/21 echo  . Hyperlipidemia    borderline  . Hypertension   . Peripheral vascular disease (HCC)   . Pneumonia    x 1 in 20s    Social History   Socioeconomic History  . Marital status: Divorced    Spouse name: Not on file  . Number of children: Not on file  . Years of education: Not on file  . Highest education level: Not on file  Occupational History  . Not on file  Tobacco Use  . Smoking status: Never  . Smokeless tobacco: Never  Vaping Use  . Vaping status: Never Used  Substance and Sexual Activity  . Alcohol use: Yes    Comment: ocassionally  . Drug use: Never  . Sexual activity: Not on file  Other Topics Concern  . Not on file  Social History Narrative  . Not on file   Social Determinants of Health   Financial Resource Strain: Not on file  Food Insecurity: Not on file  Transportation Needs: Not on file  Physical Activity: Not on file  Stress: Not on file  Social Connections: Not on file  Intimate Partner Violence: Not on file    Past Surgical History:  Procedure Laterality Date  . AMPUTATION Right 08/11/2020   Procedure: Right 1st and 5th Ray Resection;  Surgeon: Linus Galas, DPM;  Location: [facility];  Service: Podiatry;  Laterality: Right;  . AMPUTATION Right 07/18/2021   Procedure: Right foot transmetatarsal amputation, achilles tendon lengthening;  Surgeon: Toni Arthurs, MD;  Location: [facility] OR;  Service: Orthopedics;  Laterality: Right;   regional block  hold antiobiotics for cultures  . AMPUTATION TOE Left 05/29/2020   Procedure: 4th and 5th TOE AMPUTATION WITH PARTIAL RAY RESECTION;  Surgeon: Linus Galas, DPM;  Location: [facility];  Service: Podiatry;  Laterality: Left;  . AMPUTATION TOE Right 01/27/2021   Procedure: AMPUTATION TOE - 2ND MPJ;  Surgeon: Linus Galas, DPM;  Location: [facility];  Service: Podiatry;  Laterality: Right;  . CATARACT EXTRACTION W/PHACO Right 09/19/2020   Procedure: CATARACT EXTRACTION PHACO AND INTRAOCULAR LENS PLACEMENT (IOC) RIGHT INTRVITREAL TRIESCENCE DIABETIC;  Surgeon: Nevada Crane, MD;  Location: [facility];  Service: Ophthalmology;  Laterality: Right;  Diabetic - oral meds 4.55 00:36.1  . CATARACT EXTRACTION W/PHACO Left 10/10/2020   Procedure: CATARACT EXTRACTION PHACO AND INTRAOCULAR LENS PLACEMENT (IOC) LEFT INTRAVITREAL TRIESCENCE DIABETIC 4.86 00:38.7;  Surgeon: Nevada Crane, MD;  Location: [facility];  Service: Ophthalmology;  Laterality: Left;  Diabetic - oral meds  . HAND SURGERY Left   . LOWER EXTREMITY ANGIOGRAPHY Left 05/27/2020   Procedure: Lower Extremity Angiography;  Surgeon: Renford Dills, MD;  Location: [facility];  Service: Cardiovascular;  Laterality: Left;  . LOWER  EXTREMITY ANGIOGRAPHY Right 05/31/2020   Procedure: Lower Extremity Angiography;  Surgeon: Renford Dills, MD;  Location: [facility];  Service: Cardiovascular;  Laterality: Right;  . LOWER EXTREMITY ANGIOGRAPHY Right 08/10/2020   Procedure: Lower Extremity Angiography;  Surgeon: Renford Dills, MD;  Location: [facility];  Service: Cardiovascular;  Laterality: Right;  . ORIF ANKLE FRACTURE Left 03/21/2021   Procedure: OPEN REDUCTION INTERNAL FIXATION (ORIF) ANKLE FRACTURE;  Surgeon: Toni Arthurs, MD;  Location: [facility] OR;  Service: Orthopedics;  Laterality: Left;  . TONSILLECTOMY AND ADENOIDECTOMY      Family History  Problem Relation Age of Onset  .  Cancer Mother   . Cancer Father   . Hypertension Brother   . Congestive Heart Failure Brother     Allergies  Allergen Reactions  . Silvadene [Silver Sulfadiazine] Rash  . Sulfa Antibiotics Rash  . Zyvox [Linezolid] Rash  . Levofloxacin Rash    Other reaction(s): Unknown       Latest Ref Rng & Units 11/19/2021   11:22 PM 07/19/2021    7:19 AM 06/20/2021    8:14 AM  CBC  WBC 4.0 - 10.5 K/uL 13.5  6.7  6.4   Hemoglobin 13.0 - 17.0 g/dL 40.9  81.1  91.4   Hematocrit 39.0 - 52.0 % 49.4  34.7  42.4   Platelets 150 - 400 K/uL 175  201  191       CMP     Component Value Date/Time   NA 136 11/19/2021 2322   K 4.2 11/19/2021 2322   CL 106 11/19/2021 2322   CO2 21 (L) 11/19/2021 2322   GLUCOSE 123 (H) 11/19/2021 2322   BUN 24 (H) 11/19/2021 2322   CREATININE 1.32 (H) 11/19/2021 2322   CALCIUM 9.0 11/19/2021 2322   PROT 7.6 11/19/2021 2322   ALBUMIN 4.0 11/19/2021 2322   AST 23 11/19/2021 2322   ALT 20 11/19/2021 2322   ALKPHOS 62 11/19/2021 2322   BILITOT 1.8 (H) 11/19/2021 2322   GFRNONAA >60 11/19/2021 2322     No results found.     Assessment & Plan:   1. Peripheral arterial disease with history of revascularization (HCC) ***  2. Type 2 diabetes mellitus with hyperlipidemia (HCC) ***  3. Diabetic ulcer of right midfoot associated with type 2 diabetes mellitus, with fat layer exposed (HCC) ***   Current Outpatient Medications on File Prior to Visit  Medication Sig Dispense Refill  . aspirin EC 81 MG EC tablet Take 1 tablet (81 mg total) by mouth daily. Swallow whole. 30 tablet 11  . clopidogrel (PLAVIX) 75 MG tablet Take 1 tablet (75 mg total) by mouth daily. 90 tablet 3  . EPINEPHrine 0.3 mg/0.3 mL IJ SOAJ injection INJECT 0.3 ML (0.3 MG TOTAL) INTO THE MUSCLE ONCE AS NEEDED FOR ANAPHYLAXIS FOR UP TO 1 DOSE    . glipiZIDE (GLUCOTROL) 5 MG tablet Take 5 mg by mouth 2 (two) times daily.    Marland Kitchen glipiZIDE (GLUCOTROL) 5 MG tablet Take by mouth.    Marland Kitchen lisinopril  (ZESTRIL) 10 MG tablet Take 2 tablets (20 mg total) by mouth daily. 60 tablet 0  . Multiple Vitamin (MULTIVITAMIN) tablet Take 1 tablet by mouth daily.    . mupirocin ointment (BACTROBAN) 2 % SMARTSIG:1 Application Topical 2-3 Times Daily    . Polyethyl Glycol-Propyl Glycol (SYSTANE ULTRA) 0.4-0.3 % SOLN Place 1-2 drops into both eyes 3 (three) times daily as needed (dry/irritated eyes.).    Marland Kitchen pravastatin (  PRAVACHOL) 20 MG tablet Take 20 mg by mouth at bedtime.    . tadalafil (CIALIS) 20 MG tablet Take 10 mg by mouth in the morning.    Marland Kitchen acetaminophen (TYLENOL) 325 MG tablet Take 1-2 tablets (325-650 mg total) by mouth every 6 (six) hours as needed for mild pain (pain score 1-3 or temp > 100.5). (Patient not taking: Reported on 11/23/2022)    . Cholecalciferol (VITAMIN D3 PO) Take 4,000 Units by mouth in the morning. (Patient not taking: Reported on 11/23/2022)     No current facility-administered medications on file prior to visit.    There are no Patient Instructions on file for this visit. No follow-ups on file.   Georgiana Spinner, NP

## 2022-11-26 LAB — VAS US ABI WITH/WO TBI
Left ABI: 1.09
Right ABI: 1.08

## 2023-06-18 ENCOUNTER — Encounter (INDEPENDENT_AMBULATORY_CARE_PROVIDER_SITE_OTHER): Payer: Self-pay

## 2023-11-22 ENCOUNTER — Ambulatory Visit (INDEPENDENT_AMBULATORY_CARE_PROVIDER_SITE_OTHER): Payer: 59 | Admitting: Nurse Practitioner

## 2023-11-22 ENCOUNTER — Encounter (INDEPENDENT_AMBULATORY_CARE_PROVIDER_SITE_OTHER): Payer: 59
